# Patient Record
Sex: Female | Born: 1985 | Race: White | Hispanic: No | Smoking: Never smoker
Health system: Southern US, Community
[De-identification: ages and names within clinical notes are randomized; demographics above are authoritative.]

## PROBLEM LIST (undated history)

## (undated) ENCOUNTER — Inpatient Hospital Stay (HOSPITAL_COMMUNITY): Payer: Self-pay

## (undated) DIAGNOSIS — Z9889 Other specified postprocedural states: Secondary | ICD-10-CM

## (undated) DIAGNOSIS — F419 Anxiety disorder, unspecified: Secondary | ICD-10-CM

## (undated) DIAGNOSIS — Z9852 Vasectomy status: Secondary | ICD-10-CM

## (undated) DIAGNOSIS — F418 Other specified anxiety disorders: Secondary | ICD-10-CM

## (undated) DIAGNOSIS — G43909 Migraine, unspecified, not intractable, without status migrainosus: Secondary | ICD-10-CM

## (undated) DIAGNOSIS — R002 Palpitations: Secondary | ICD-10-CM

## (undated) DIAGNOSIS — F32A Depression, unspecified: Secondary | ICD-10-CM

## (undated) DIAGNOSIS — IMO0002 Reserved for concepts with insufficient information to code with codable children: Secondary | ICD-10-CM

## (undated) DIAGNOSIS — Z8742 Personal history of other diseases of the female genital tract: Secondary | ICD-10-CM

## (undated) DIAGNOSIS — R87619 Unspecified abnormal cytological findings in specimens from cervix uteri: Secondary | ICD-10-CM

## (undated) DIAGNOSIS — G43009 Migraine without aura, not intractable, without status migrainosus: Secondary | ICD-10-CM

## (undated) DIAGNOSIS — N871 Moderate cervical dysplasia: Secondary | ICD-10-CM

## (undated) HISTORY — DX: Reserved for concepts with insufficient information to code with codable children: IMO0002

## (undated) HISTORY — DX: Other specified postprocedural states: Z98.890

## (undated) HISTORY — DX: Unspecified abnormal cytological findings in specimens from cervix uteri: R87.619

## (undated) HISTORY — DX: Other specified anxiety disorders: F41.8

## (undated) HISTORY — PX: NO PAST SURGERIES: SHX2092

---
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
## 2007-01-30 ENCOUNTER — Ambulatory Visit: Payer: Self-pay | Admitting: Sports Medicine

## 2007-01-31 ENCOUNTER — Telehealth: Payer: Self-pay | Admitting: *Deleted

## 2007-03-21 ENCOUNTER — Telehealth: Payer: Self-pay | Admitting: *Deleted

## 2007-03-21 ENCOUNTER — Emergency Department (HOSPITAL_COMMUNITY): Admission: EM | Admit: 2007-03-21 | Discharge: 2007-03-21 | Payer: Self-pay | Admitting: Emergency Medicine

## 2007-04-25 ENCOUNTER — Telehealth: Payer: Self-pay | Admitting: *Deleted

## 2007-05-30 ENCOUNTER — Telehealth (INDEPENDENT_AMBULATORY_CARE_PROVIDER_SITE_OTHER): Payer: Self-pay | Admitting: *Deleted

## 2007-05-30 ENCOUNTER — Ambulatory Visit: Payer: Self-pay | Admitting: Family Medicine

## 2007-06-13 ENCOUNTER — Encounter (INDEPENDENT_AMBULATORY_CARE_PROVIDER_SITE_OTHER): Payer: Self-pay | Admitting: Family Medicine

## 2007-06-13 ENCOUNTER — Ambulatory Visit: Payer: Self-pay | Admitting: Family Medicine

## 2007-06-13 LAB — CONVERTED CEMR LAB
Beta hcg, urine, semiquantitative: NEGATIVE
Chlamydia, DNA Probe: NEGATIVE

## 2007-06-19 ENCOUNTER — Encounter (INDEPENDENT_AMBULATORY_CARE_PROVIDER_SITE_OTHER): Payer: Self-pay | Admitting: Family Medicine

## 2007-12-31 ENCOUNTER — Telehealth: Payer: Self-pay | Admitting: *Deleted

## 2008-01-01 ENCOUNTER — Ambulatory Visit: Payer: Self-pay | Admitting: Family Medicine

## 2008-01-01 ENCOUNTER — Ambulatory Visit (HOSPITAL_COMMUNITY): Admission: RE | Admit: 2008-01-01 | Discharge: 2008-01-01 | Payer: Self-pay | Admitting: Family Medicine

## 2008-01-01 ENCOUNTER — Encounter (INDEPENDENT_AMBULATORY_CARE_PROVIDER_SITE_OTHER): Payer: Self-pay | Admitting: Family Medicine

## 2008-01-01 DIAGNOSIS — R002 Palpitations: Secondary | ICD-10-CM

## 2008-01-01 LAB — CONVERTED CEMR LAB
BUN: 11 mg/dL (ref 6–23)
Calcium: 9.6 mg/dL (ref 8.4–10.5)
MCHC: 33.3 g/dL (ref 30.0–36.0)
Potassium: 4.5 meq/L (ref 3.5–5.3)
RDW: 13 % (ref 11.5–15.5)
Sodium: 141 meq/L (ref 135–145)
TSH: 1.291 microintl units/mL (ref 0.350–5.50)

## 2008-01-02 ENCOUNTER — Telehealth: Payer: Self-pay | Admitting: *Deleted

## 2008-01-03 ENCOUNTER — Encounter (INDEPENDENT_AMBULATORY_CARE_PROVIDER_SITE_OTHER): Payer: Self-pay | Admitting: Family Medicine

## 2008-01-03 ENCOUNTER — Encounter: Payer: Self-pay | Admitting: *Deleted

## 2008-01-06 ENCOUNTER — Encounter (INDEPENDENT_AMBULATORY_CARE_PROVIDER_SITE_OTHER): Payer: Self-pay | Admitting: Family Medicine

## 2008-01-06 ENCOUNTER — Ambulatory Visit: Payer: Self-pay | Admitting: Family Medicine

## 2008-01-07 ENCOUNTER — Ambulatory Visit: Payer: Self-pay | Admitting: Family Medicine

## 2008-02-11 ENCOUNTER — Telehealth (INDEPENDENT_AMBULATORY_CARE_PROVIDER_SITE_OTHER): Payer: Self-pay | Admitting: Family Medicine

## 2008-03-09 ENCOUNTER — Emergency Department (HOSPITAL_COMMUNITY): Admission: EM | Admit: 2008-03-09 | Discharge: 2008-03-09 | Payer: Self-pay | Admitting: Family Medicine

## 2008-03-21 IMAGING — CR DG MANDIBLE 4+V
4 series · 4 of 4 positions shown · non-contrast
Comparison: none

CLINICAL DATA: Left jaw trauma and pain.  
 MANDIBLE ? 4 VIEW:

[view not recorded (1 of 4)]
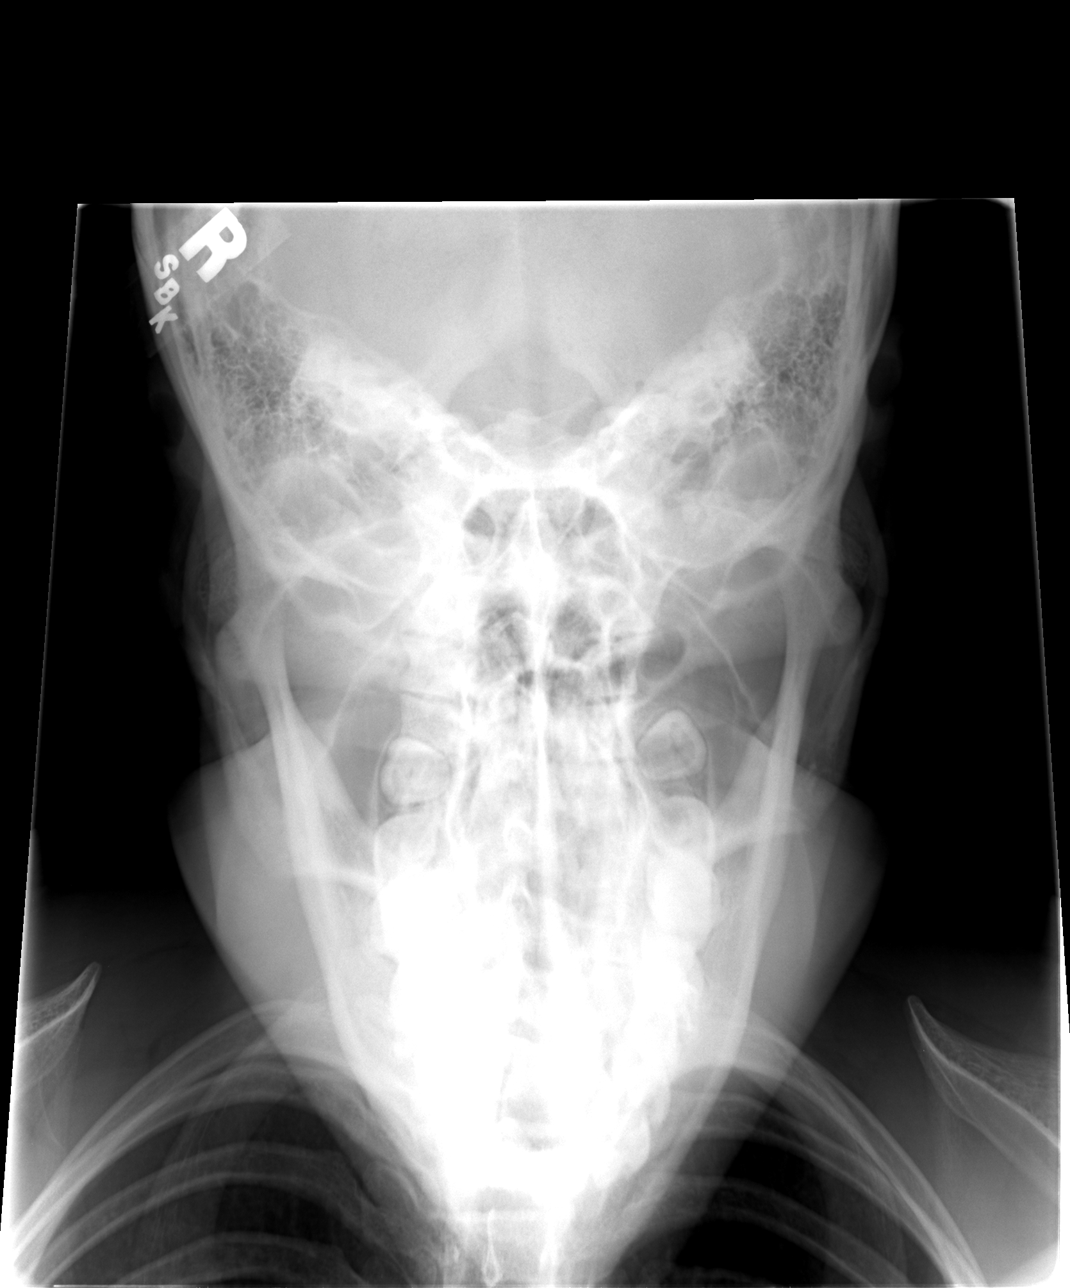

[view not recorded (2 of 4)]
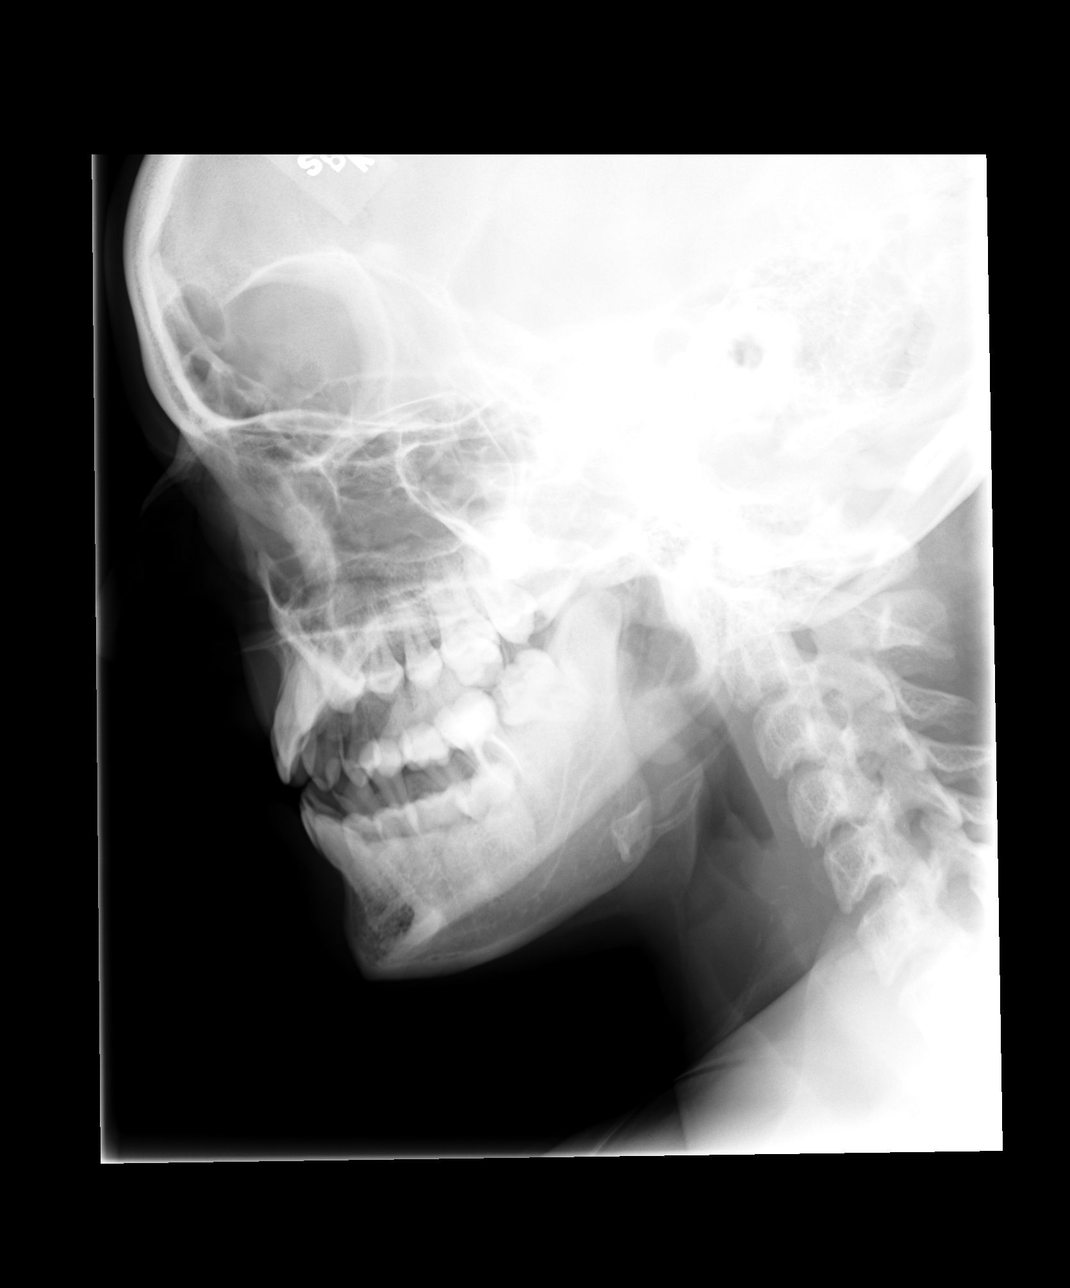

[view not recorded (3 of 4)]
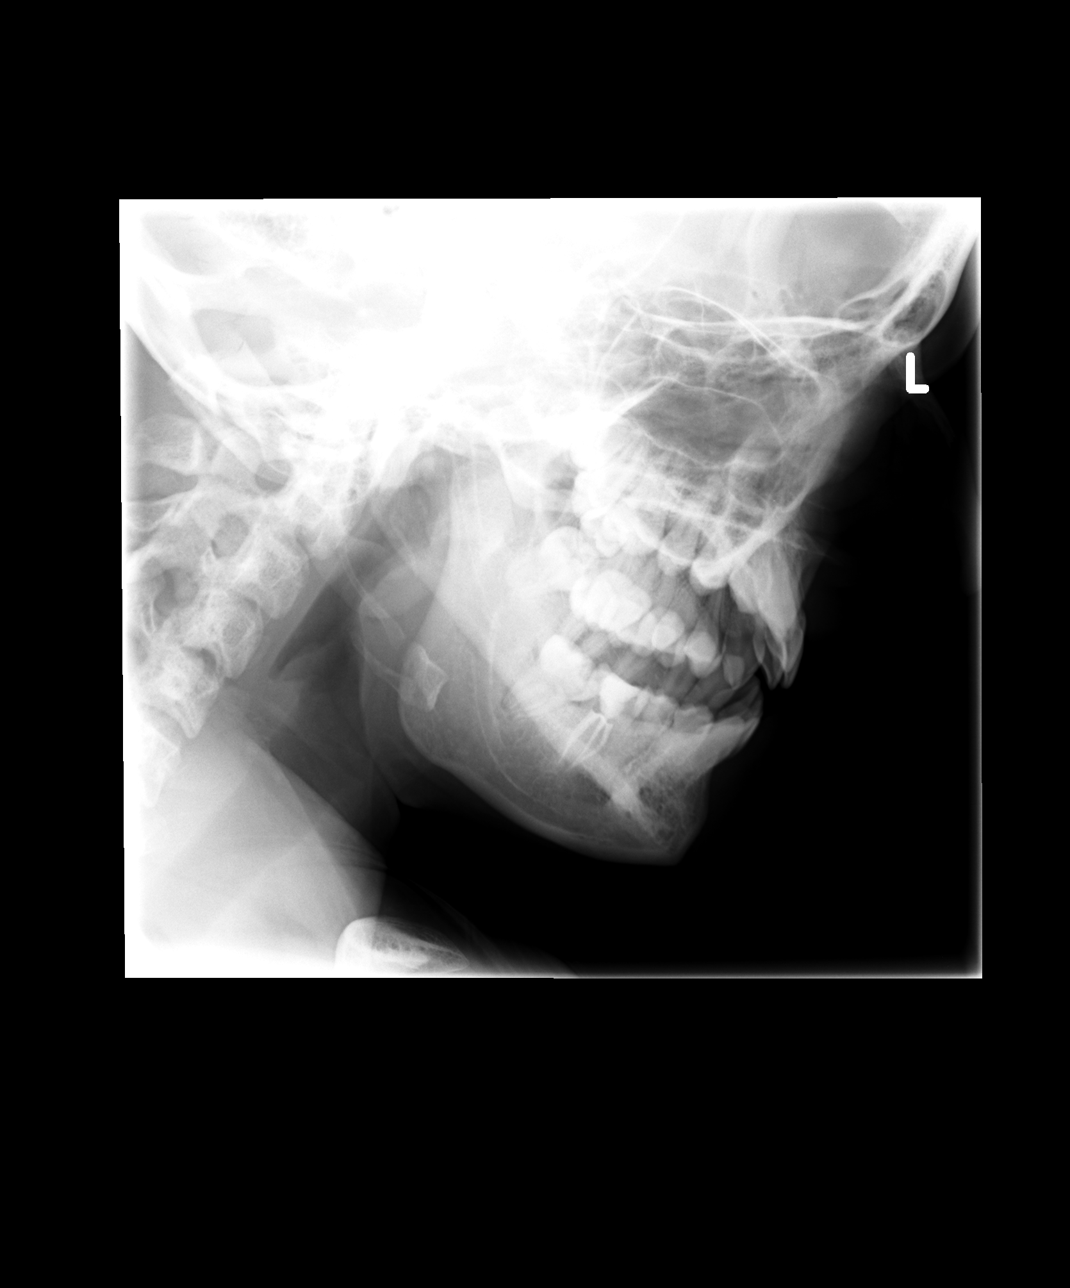

[view not recorded (4 of 4)]
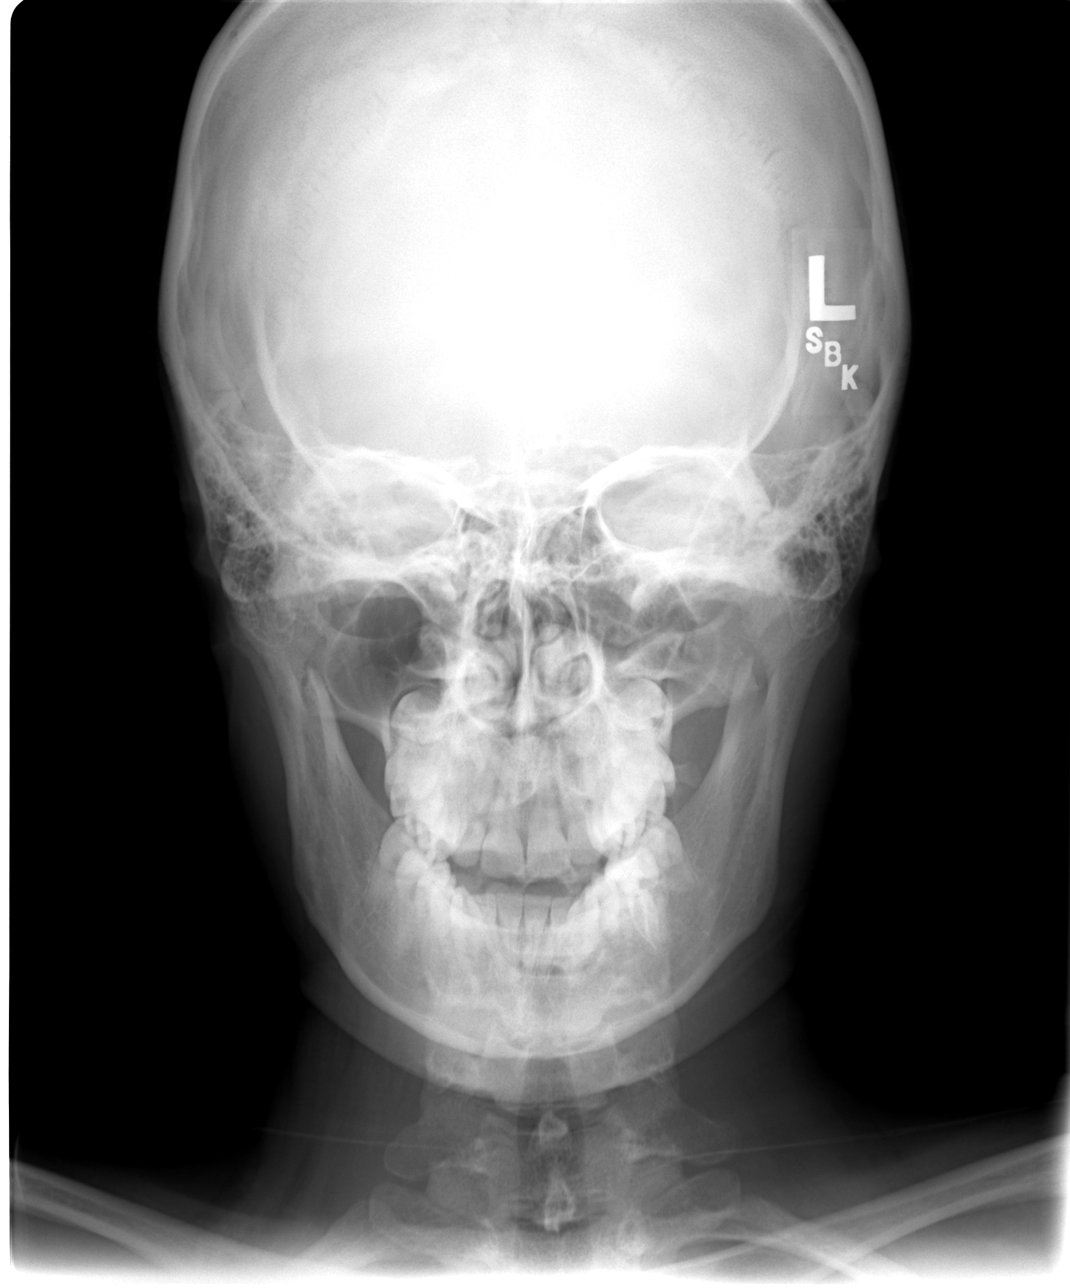

[4 of 4 positions shown; findings below may reference images not displayed]

FINDINGS: There is no evidence of fracture or dislocation.  No other bone lesions are seen involving the mandible.
IMPRESSION: Negative.

## 2008-05-22 ENCOUNTER — Encounter (INDEPENDENT_AMBULATORY_CARE_PROVIDER_SITE_OTHER): Payer: Self-pay | Admitting: Family Medicine

## 2008-05-22 ENCOUNTER — Ambulatory Visit: Payer: Self-pay | Admitting: Family Medicine

## 2008-05-22 LAB — CONVERTED CEMR LAB
Beta hcg, urine, semiquantitative: NEGATIVE
GC Probe Amp, Genital: NEGATIVE

## 2008-05-26 ENCOUNTER — Encounter (INDEPENDENT_AMBULATORY_CARE_PROVIDER_SITE_OTHER): Payer: Self-pay | Admitting: Family Medicine

## 2008-11-24 ENCOUNTER — Ambulatory Visit: Payer: Self-pay | Admitting: Family Medicine

## 2008-11-25 ENCOUNTER — Encounter (INDEPENDENT_AMBULATORY_CARE_PROVIDER_SITE_OTHER): Payer: Self-pay | Admitting: Family Medicine

## 2008-11-25 ENCOUNTER — Ambulatory Visit (HOSPITAL_COMMUNITY): Admission: AD | Admit: 2008-11-25 | Discharge: 2008-11-25 | Payer: Self-pay | Admitting: Family Medicine

## 2008-11-26 ENCOUNTER — Encounter: Payer: Self-pay | Admitting: *Deleted

## 2008-11-27 ENCOUNTER — Encounter (INDEPENDENT_AMBULATORY_CARE_PROVIDER_SITE_OTHER): Payer: Self-pay | Admitting: Family Medicine

## 2009-02-03 ENCOUNTER — Ambulatory Visit: Payer: Self-pay | Admitting: Family Medicine

## 2009-02-03 LAB — CONVERTED CEMR LAB
Bilirubin Urine: NEGATIVE
Protein, U semiquant: >=300
Urobilinogen, UA: 0.2

## 2009-02-04 ENCOUNTER — Encounter (INDEPENDENT_AMBULATORY_CARE_PROVIDER_SITE_OTHER): Payer: Self-pay | Admitting: Family Medicine

## 2009-05-20 ENCOUNTER — Telehealth: Payer: Self-pay | Admitting: Family Medicine

## 2009-05-20 ENCOUNTER — Ambulatory Visit: Payer: Self-pay | Admitting: Family Medicine

## 2009-09-12 ENCOUNTER — Emergency Department (HOSPITAL_COMMUNITY): Admission: EM | Admit: 2009-09-12 | Discharge: 2009-09-12 | Payer: Self-pay | Admitting: Family Medicine

## 2009-10-28 ENCOUNTER — Ambulatory Visit: Payer: Self-pay | Admitting: Family Medicine

## 2009-10-28 ENCOUNTER — Encounter: Payer: Self-pay | Admitting: Family Medicine

## 2009-10-28 DIAGNOSIS — A630 Anogenital (venereal) warts: Secondary | ICD-10-CM

## 2009-10-28 LAB — CONVERTED CEMR LAB

## 2009-10-29 ENCOUNTER — Encounter: Payer: Self-pay | Admitting: Family Medicine

## 2009-11-09 ENCOUNTER — Encounter: Payer: Self-pay | Admitting: Family Medicine

## 2009-11-25 ENCOUNTER — Encounter: Payer: Self-pay | Admitting: Family Medicine

## 2009-11-25 ENCOUNTER — Ambulatory Visit: Payer: Self-pay | Admitting: Family Medicine

## 2009-11-25 DIAGNOSIS — R8789 Other abnormal findings in specimens from female genital organs: Secondary | ICD-10-CM | POA: Insufficient documentation

## 2010-01-26 ENCOUNTER — Ambulatory Visit: Payer: Self-pay | Admitting: Family Medicine

## 2010-01-26 ENCOUNTER — Encounter: Payer: Self-pay | Admitting: Family Medicine

## 2010-01-26 LAB — CONVERTED CEMR LAB: Beta hcg, urine, semiquantitative: NEGATIVE

## 2010-02-02 ENCOUNTER — Ambulatory Visit: Payer: Self-pay | Admitting: Family Medicine

## 2010-02-02 LAB — CONVERTED CEMR LAB: Beta hcg, urine, semiquantitative: NEGATIVE

## 2010-04-19 ENCOUNTER — Emergency Department (HOSPITAL_COMMUNITY): Admission: EM | Admit: 2010-04-19 | Discharge: 2010-04-19 | Payer: Self-pay | Admitting: Emergency Medicine

## 2010-06-21 ENCOUNTER — Encounter: Payer: Self-pay | Admitting: *Deleted

## 2010-06-21 ENCOUNTER — Ambulatory Visit: Payer: Self-pay | Admitting: Family Medicine

## 2010-06-21 DIAGNOSIS — H547 Unspecified visual loss: Secondary | ICD-10-CM | POA: Insufficient documentation

## 2010-06-21 LAB — CONVERTED CEMR LAB: Beta hcg, urine, semiquantitative: POSITIVE

## 2010-06-28 ENCOUNTER — Ambulatory Visit (HOSPITAL_COMMUNITY): Admission: RE | Admit: 2010-06-28 | Discharge: 2010-06-28 | Payer: Self-pay | Admitting: Family Medicine

## 2010-06-28 ENCOUNTER — Telehealth: Payer: Self-pay | Admitting: Family Medicine

## 2010-06-28 ENCOUNTER — Encounter: Payer: Self-pay | Admitting: Family Medicine

## 2010-06-29 ENCOUNTER — Encounter: Payer: Self-pay | Admitting: Family Medicine

## 2010-07-05 ENCOUNTER — Encounter: Payer: Self-pay | Admitting: Family Medicine

## 2010-07-05 ENCOUNTER — Ambulatory Visit: Payer: Self-pay | Admitting: Family Medicine

## 2010-07-05 LAB — CONVERTED CEMR LAB
HIV: NONREACTIVE
Rh Type: POSITIVE

## 2010-07-07 ENCOUNTER — Encounter: Payer: Self-pay | Admitting: Family Medicine

## 2010-07-10 LAB — CONVERTED CEMR LAB
Basophils Absolute: 0 10*3/uL (ref 0.0–0.1)
Basophils Relative: 0 % (ref 0–1)
Eosinophils Absolute: 0.1 10*3/uL (ref 0.0–0.7)
Eosinophils Relative: 3 % (ref 0–5)
HCT: 43.5 % (ref 36.0–46.0)
Hemoglobin: 12.9 g/dL (ref 12.0–15.0)
Hepatitis B Surface Ag: NEGATIVE
MCHC: 29.7 g/dL — ABNORMAL LOW (ref 30.0–36.0)
MCV: 99.5 fL (ref 78.0–100.0)
Monocytes Absolute: 0.4 10*3/uL (ref 0.1–1.0)
Platelets: 212 10*3/uL (ref 150–400)
RDW: 14.2 % (ref 11.5–15.5)

## 2010-07-21 ENCOUNTER — Ambulatory Visit: Payer: Self-pay | Admitting: Family Medicine

## 2010-07-21 LAB — CONVERTED CEMR LAB
Chlamydia, DNA Probe: NEGATIVE
GC Probe Amp, Genital: NEGATIVE

## 2010-08-08 ENCOUNTER — Encounter: Payer: Self-pay | Admitting: Family Medicine

## 2010-08-08 ENCOUNTER — Ambulatory Visit (HOSPITAL_COMMUNITY)
Admission: RE | Admit: 2010-08-08 | Discharge: 2010-08-08 | Payer: Self-pay | Source: Home / Self Care | Admitting: Family Medicine

## 2010-08-17 ENCOUNTER — Ambulatory Visit: Payer: Self-pay | Admitting: Family Medicine

## 2010-09-07 ENCOUNTER — Ambulatory Visit (HOSPITAL_COMMUNITY)
Admission: RE | Admit: 2010-09-07 | Discharge: 2010-09-07 | Payer: Self-pay | Source: Home / Self Care | Attending: Family Medicine | Admitting: Family Medicine

## 2010-09-15 ENCOUNTER — Ambulatory Visit: Admission: RE | Admit: 2010-09-15 | Discharge: 2010-09-15 | Payer: Self-pay | Source: Home / Self Care

## 2010-09-21 ENCOUNTER — Ambulatory Visit (HOSPITAL_COMMUNITY)
Admission: RE | Admit: 2010-09-21 | Discharge: 2010-09-21 | Payer: Self-pay | Source: Home / Self Care | Attending: Family Medicine | Admitting: Family Medicine

## 2010-09-21 ENCOUNTER — Encounter: Payer: Self-pay | Admitting: Family Medicine

## 2010-10-01 ENCOUNTER — Encounter: Payer: Self-pay | Admitting: Family Medicine

## 2010-10-13 NOTE — Assessment & Plan Note (Signed)
Summary: ob ,df   Vital Signs:  Patient profile:   25 year old female Weight:      118.5 pounds Temp:     97.8 degrees F oral Pulse rate:   79 / minute Pulse rhythm:   regular BP sitting:   104 / 62 Cuff size:   regular  Vitals Entered By: Loralee Pacas CMA (September 15, 2010 3:47 PM) CC: ob Is Patient Diabetic? No   Primary Provider:  Delbert Harness MD  CC:  ob.  History of Present Illness: Correction from previous documentation:  This patient is Caucasian.  25 yo white F G1P0 returns for an OB follow-up.  She is 17w 4d today.  At her last visit, she c/o chronic daily headaches.  Was advised to take Tylenol as needed, but to use sparingly.  Today she says that her HAs have resolved.    She c/o abnormal vaginal discharge that occurred last week, but has resolved now.  She also experienced some itching in her genital area.  Her boyfriend also experienced genital itching, went to his MD, and is being treated for a fungal infection.  His MD said that he may have extracted the fungal infection from my patient.  Patient is asymptomatic today, but concerned she may pass it on to her boyfriend via intercourse.  Habits & Providers  Alcohol-Tobacco-Diet     Tobacco Status: never     Cigarette Packs/Day: n/a  Exercise-Depression-Behavior     Have you felt down or hopeless? no     Have you felt little pleasure in things? no     Depression Counseling: not indicated; screening negative for depression     Seat Belt Use: always  Current Medications (verified): 1)  Prenatal Vitamins 0.8 Mg Tabs (Prenatal Multivit-Min-Fe-Fa) .... Take One Tablet Daily 2)  Nystatin 100000 Unit/gm Crea (Nystatin) .... Apply To Affected Area Twice A Day As Needed If Symptomatic.  Allergies (verified): 1)  Sudafed (Pseudoephedrine Hcl)  Past History:  Past Medical History: Last updated: 10/28/2009 Microscopic hematuria with UTI on 7/07 Normal UA 8, Pyelonephritis 7/07  04/06/08 - Holter monitor test  ([facility])- rare PACs, occ PVC, basic rhythm sinus rates 51-160, daily entries did not correlate with any significant arrhythmias  G1P0010- TAB 11/2008  Family History: Last updated: 07/21/2010 Sister with scoliosis Grandfather - unknown kind of cancer mom with arrythmia  uncle with mi at age 40 asthma - history  No female cancers  No DM in 1st degree relatives.  Social History: Last updated: 06/21/2010 Did graduate from highschool.  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  Denies smoking, and ilicit drugs use. Occasional ETOH.   Physical Exam  General:  alert, healthy-appearing, and cooperative to examination.     Impression & Recommendations:  Problem # 1:  PREGNANCY, NORMAL (ICD-V22.2) Pt doing well today.  Since pt is asymptomatic today, will not give Diflucan.  Will treat with Nystatin cream as needed if patient becomes symptomatic.  Discussed with couple that yeast infections are not typically sexually transmitted.  It seems like her infection has resolved on its own.  Told patient if she experiences any dysuria, hematuria, increased itchiness to call MD for an re-evaluation.  Waiting for results of integrated screening test completed 12/28.  Will schedule an anatomy scan for pt.  Will call her with date/time.  Pt to f/u in 4 weeks for next OB visit.   Orders: Medicaid OB visit -  [facility] [phone])  Patient Instructions: 1)  It was great to see you today. 2)  Your pregnancy seems to be progressing as it should. 3)  You can pick up your new medication and take as directed. 4)  If your symptoms become worse or you experience pain when you urinate, bloody urine, or itchiness, please call MD. 5)  We will set up your anatomy scan at [facility] and call you with the time/date. 6)  Please schedule a follow-up appointment in 4 weeks. 7)  Thanks. Prescriptions: NYSTATIN 100000 UNIT/GM CREA (NYSTATIN) Apply to  affected area twice a day as needed if symptomatic.  #1 x 0   Entered and Authorized by:   Vamsi Apfel de Lawson Radar  MD   Signed by:   Barnabas Lister  MD on 09/15/2010   Method used:   Electronically to        [facility] [phone]* (retail)       [address]       [place], [place]  [address]       Ph: [phone] or [phone]       Fax: [phone]   RxID:   [id number]    Orders Added: 1)  Medicaid OB visit - [facility] [[id number]]      Flowsheet View for Follow-up Visit    Estimated weeks of       gestation:     17 4/7    Weight:     118.5    Blood pressure:   104 / 62    Headache:     few    Nausea/vomiting:   No    Edema:     0    Vaginal bleeding:   no    Vaginal discharge:   d/c    Fundal height:      19    FHR:       148    Fetal activity:     no    Labor symptoms:   no    Smoking:     n/a    Flowsheet View for Follow-up Visit    Estimated weeks of       gestation:     17 4/7    Weight:     118.5    Blood pressure:   104 / 62    Hx headache?     few    Nausea/vomiting?   No    Edema?     0    Bleeding?     no    Leakage/discharge?   d/c    Fetal activity:       no    Labor symptoms?   no    Fundal height:      19    FHR:       148    Smoking PPD:   n/a   Appended Document: ob ,df  order for antomy [place].Loralee Pacas CMA  September 15, 2010 5:12 PM    Clinical Lists Changes  Orders: Added new Test order of Ultrasound (Ultrasound) - Signed

## 2010-10-13 NOTE — Assessment & Plan Note (Signed)
Summary: genital warts   Vital Signs:  Patient profile:   25 year old female Height:      61.5 inches Weight:      113 pounds BMI:     21.08 BSA:     1.49 Temp:     98.7 degrees F Pulse rate:   97 / minute BP sitting:   135 / 79  Vitals Entered By: Jone Baseman CMA (Jan 26, 2010 2:04 PM) CC: genital warts Is Patient Diabetic? No Pain Assessment Patient in pain? no        Primary Care Provider:  Delbert Harness MD  CC:  genital warts.  History of Present Illness: 25 yo female here for podophyllin treatment.  Missed last treatment.  Treated once in March.  Warts are spreading.  PODOPHYLLIN TREATMENT.  Negative pregnancy test confirmed.  Verbal consent obtained.  Petrolatum jelly applied around genital warts.  Scant podophyllyn applied with swab to warts.  Petrolatum jelly and gauze applied.  Patient tolerated well.  Told to remove dressing in 2 hours. wash area with soap and water. RTC in 1 week for repeat treatment.  Patient tolerated procedure with no problems. <0.20 cc podophyllin used.  Habits & Providers  Alcohol-Tobacco-Diet     Tobacco Status: never  Allergies: 1)  Sudafed (Pseudoephedrine Hcl)  Physical Exam  General:  Well-developed,well-nourished,in no acute distress; alert,appropriate and cooperative throughout examination Genitalia:  Normal female genitalia 2 lesions c/w genital warts between vagina and anus.  Approx 6-8 mm each.  Now with several smaller warts spreading anterior to vagina.    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 Orders: U [facility] [id number]) Provider Misc Charge- [facility] (Misc)  Complete Medication List: 1)  Yaz 3-0.02 Mg Tabs (Drospirenone-ethinyl estradiol) .... Take one tablet at the same time daily  Patient Instructions: 1)  Please see Rudell Cobb to qualify for reduced or free medical services within the [facility].  Call her at [phone]  today. 2)  Please schedule a follow-up appointment in 1 week for repeat treatment.  Laboratory Results   Urine Tests  Date/Time Received: Jan 26, 2010 2:35 PM  Date/Time Reported: Jan 26, 2010 2:49 PM     Urine HCG: negative Comments: ...............test performed by......Marland KitchenBonnie A. [place], MLS (ASCP)cm

## 2010-10-13 NOTE — Letter (Signed)
Summary: Results Follow-up Letter  [facility]  [address]   [place], [place] [address]   Phone: [phone]  Fax: [phone]    11/09/2009  [address] [place], [place]  [address]  Dear Ms. MOODY,   The following are the results of your recent test(s):  Test     Result     Pap Smear    Normal_______  Not Normal__X___       Comments: results: LSIL  Your pap smear is abnormal and I would like you to schedule a colposcopy so we can further evaluate this.  We can do this at our office, just call for an appt.  If you have further questions, please give [place] a call or we can discuss when you come to the office.  Sincerely,  Delbert Harness MD [facility]           Appended Document: Results Follow-up Letter mailed.

## 2010-10-13 NOTE — Miscellaneous (Signed)
Summary: Procedure Consent  Procedure Consent   Imported By: Bradly Bienenstock 11/30/2009 15:48:04  _____________________________________________________________________  External Attachment:    Type:   Image     Comment:   External Document

## 2010-10-13 NOTE — Assessment & Plan Note (Signed)
Summary: colpo,df   Vital Signs:  Patient profile:   25 year old female Height:      61.5 inches Weight:      115.7 pounds BMI:     21.59 Temp:     97.9 degrees F oral Pulse rate:   79 / minute BP sitting:   111 / 73  (left arm) Cuff size:   regular  Vitals Entered By: Gladstone Pih (November 25, 2009 10:45 AM) CC: Colpo Is Patient Diabetic? No Pain Assessment Patient in pain? no        Primary Care Provider:  Delbert Harness MD  CC:  Colpo.  History of Present Illness: 25 yo female presenting for:  COLPOSCOPY.  LGSIL on pap  GENITAL WARTS.  Could not afford Aldara cream.  Discussed option of Podophyllin.  She would like this done.  Discussed that cannot be done if pregnant.  She is sexually active (LMP 10/31/2009) and is not using contraception.  Habits & Providers  Alcohol-Tobacco-Diet     Tobacco Status: never  Allergies: 1)  Sudafed (Pseudoephedrine Hcl)  Physical Exam  Genitalia:  Pelvic Exam:        External: normal female genitalia 2 lesions c/w genital warts between vagina and anus.  Approx 6-8 mm each        Vagina: normal without lesions or masses        Cervix: normal without lesions or masses        Adnexa: normal bimanual exam without masses or fullness        Uterus: normal by palpation        Pap smear: not performed Additional Exam:  PODOPHYLLIN TREATMENT.  Negative pregnancy test confirmed.  Verbal consent obtained.  Petrolatum jelly applied around genital warts.  Scant podophyllyn applied with swab to warts.  Petrolatum jelly and gauze applied.  Patient tolerated well.  Told to remove dressing in 2 hours. wash area with soap and water. RTC in 1 week for repeat treatment. Patient tolerated procedure with no problems. <0.20 cc podophyllin used.  Patient given informed consent, signed copy in the chart. Placed in lithotomy position. Cervix viewed with speculum and colposcope. Was the entire squamocolumnar junction senoen?yes ectropion Any acetowhite  lesions noted?no Any abnormalities seen with green filter?no Any abnormalities seen with application of Lugol's solution?no Was the endocervical canal sampled?no Were any cervical biopsies taken?no Were there any complications?no COMMENTS: Patient was given post procedure instructions. .    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 Take one tablet at the same time daily  Other Orders: U [facility] ([id number])  Patient Instructions: 1)  Pleasure to meet you today. 2)  Your colposcopy was normal. 3)  You need to have a repeat Pap in 1 year. 4)  Please schedule a follow-up appointment in 1 week with the [facility] for repeat wart treatment.    Laboratory Results   Urine Tests  Date/Time Received: November 25, 2009 11:50 AM  Date/Time Reported: November 25, 2009 12:06 PM     Urine HCG: negative Comments: ...........test performed by...........Marland KitchenTerese Door, CMA

## 2010-10-13 NOTE — Progress Notes (Signed)
Summary: phn msg   Phone Note Call from Patient Call back at Home Phone [phone]   Caller: Patient Summary of Call: Pt says she is returning Dr. Leonie Green call. Initial call taken by: Clydell Hakim,  June 28, 2010 4:13 PM  Follow-up for Phone Call        discussed results of ultrasound with patient.  Report noted subchorionic hemorrhage.  likely incidnetnal finding, small risk of miscarraige.  No change in management now.  advised to go to [facility] hospital if vaginal bleeding.    Patient had question about genital warts.  advised if has bothersome lesions, make appointment for treatment (TCA or cryo), otherwise genital warts does not affect pregnancy.  Discussed may rarely be an issue in delivery if obstructs canal.   Follow-up by: Delbert Harness MD,  June 29, 2010 11:37 AM

## 2010-10-13 NOTE — Assessment & Plan Note (Signed)
Summary: cpe/pap,tcb   Vital Signs:  Patient profile:   25 year old female Height:      61.5 inches Weight:      113.4 pounds BMI:     21.16 Temp:     97.9 degrees F oral Pulse rate:   72 / minute BP sitting:   108 / 71  (left arm) Cuff size:   regular  Vitals Entered By: Gladstone Pih (October 28, 2009 3:44 PM) CC: CPE, PAP Is Patient Diabetic? No Pain Assessment Patient in pain? no        Primary Care Provider:  Alanda Amass MD  CC:  CPE and PAP.  History of Present Illness: 25 yo G1P0010  Therapeutic Abortion last march at 6-7 weeks. LMP:  Jan 20th, regular, no dysmenorrhea. Contraception: None Regular Menses: yes  Hx of Anemia:yes FHx of Breast, Uterine, Cervical or Ovarian Cancer:  No Last Pap:  2008 or 2009 Hx of Abnormal Pap:  No Desires STD testing: Yes Last Mammogram: No Abnormalities on Self-exam:  No Hx of Abnormal Mammogram:  No  Other issues / complaints:  "Bumps" in gential area.  First noticed 2 months ago a single bump.  Now more.  Flesh colored.  Normal vaginal discharge. None on partner.  No burning or itching.   Habits & Providers  Alcohol-Tobacco-Diet     Tobacco Status: never  Current Medications (verified): 1)  Yaz 3-0.02 Mg Tabs (Drospirenone-Ethinyl Estradiol) .... Take One Tablet At The Same Time Daily 2)  Aldara 5 % Crea (Imiquimod) .... Apply To Affected Areas 3 Times A Week Until Cleared or For 16 Weeks.  Allergies: 1)  Sudafed (Pseudoephedrine Hcl)  Past History:  Past Medical History: Microscopic hematuria with UTI on 7/07 Normal UA 8, Pyelonephritis 7/07  04/06/08 - Holter monitor test ([facility])- rare PACs, occ PVC, basic rhythm sinus rates 51-160, daily entries did not correlate with any significant arrhythmias  G1P0010- TAB 11/2008  Social History: Did graduate from [employer].  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 Denies smoking, and ilicit  drugs use. Occasional ETOH.   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  Lives with mom.  One sexual partner for the past 6 months.  Review of Systems      See HPI General:  Denies fever and weight loss. CV:  Denies chest pain or discomfort, palpitations, and shortness of breath with exertion. Resp:  Denies cough and shortness of breath. GI:  Denies abdominal pain and change in bowel habits. GU:  Complains of genital sores; denies abnormal vaginal bleeding, discharge, and dysuria.  Physical Exam  General:  VS reviewed.  Otherwise NAD. Breasts:  No mass, nodules, thickening, tenderness, bulging, retraction, inflamation, nipple discharge or skin changes noted.   Lungs:  Normal respiratory effort, chest expands symmetrically. Lungs are clear to auscultation, no crackles or wheezes. Heart:  Normal rate and regular rhythm. S1 and S2 normal without gallop, murmur, click, rub or other extra sounds. Abdomen:  Bowel sounds positive,abdomen soft and non-tender without masses, organomegaly or hernias noted. Genitalia:  Two small clusters of genital warts located at 5 and 7 oclock on perineum at opening of vagina.  Pelvic Exam:        External: normal female genitalia without lesions or masses        Vagina: normal without lesions or masses        Cervix: normal without lesions or masses  Adnexa: normal bimanual exam without masses or fullness        Uterus: normal by palpation        Pap smear: performed   Impression & Recommendations:  Problem # 1:  Gynecological examination-routine (ICD-V72.31) Discussed contraception options- patient would like to stay with Yaz even thought it is very expensive for her.  Offered her other options and to call back if she changes her mind.  Advised to take daily multivitamin.  Follow-up in 1 year or sooner if needed.  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  Advised if does not improve, may consider TCA teratment at  health department as we only offer cryotherapy.  Advised usuing protection at all times and of risk of trasmission to others.  Given handout.  patient without further questions.  Complete Medication List: 1)  Yaz 3-0.02 Mg Tabs (Drospirenone-ethinyl estradiol) .... Take one tablet at the same time daily 2)  Aldara 5 % Crea (Imiquimod) .... Apply to affected areas 3 times a week until cleared or for 16 weeks.  Other Orders: GC/[facility]lamydia[facility] ([id number]) [phone][facility] Pap Smear-[facility] ([id number]) HIV-[facility] ([id number]) RPR[facility] [phone])  Patient Instructions: 1)  Take a daily multivitamin 2)  If you find Yaz too expensive, call and I will prescribe you a generic birth control.  Use backup birth co ntrol for the first month. 3)  Aldara: Apply a thin layer 3 times/week on alternative days prior to bedtime and leave on skin for 6-10 hours. Remove by washing with mild soap and water. Continue imiquimod treatment until there is total clearance of the genital/perianal warts or 16 weeks.  Cannot be used in pregnancy. 4)  If not improvement in 6 weeks, I would go to Health Department for TCA treatment. 5)  make follow-up appt to talk about your asthma and any other concerns Prescriptions: ALDARA 5 % CREA (IMIQUIMOD) apply to affected areas 3 times a week until cleared or for 16 weeks.  #1 x 0   Entered and Authorized by:   Delbert Harness MD   Signed by:   Delbert Harness MD on 10/28/2009   Method used:   Print then Give to Patient   RxID:   [id number] YAZ 3-0.02 MG TABS (DROSPIRENONE-ETHINYL ESTRADIOL) take one tablet at the same time daily  #1 x 11   Entered and Authorized by:   Delbert Harness MD   Signed by:   Delbert Harness MD on 10/28/2009   Method used:   Print then Give to Patient   RxID:   [id number]

## 2010-10-13 NOTE — Letter (Signed)
Summary: Results Follow-up Letter  [facility]  [address]   [place], [place] [address]   Phone: [phone]  Fax: [phone]    10/29/2009  [address] [place], [place]  [address]  Dear Ms. MOODY,   The following are the results of your recent test(s):  Your tests for gonorrhea, chlamydia, HIV, Syphillis are all negative.  PLease feel free to give the office a call if tehre is anything else I can do for you.   Sincerely,  Delbert Harness MD [facility]           Appended Document: Results Follow-up Letter mailed.

## 2010-10-13 NOTE — Assessment & Plan Note (Signed)
Summary: ob,df    Vital Signs:  Patient profile:   25 year old female Height:      61.5 inches Weight:      115.4 pounds Temp:     98.2 degrees F oral Pulse rate:   68 / minute BP sitting:   104 / 70  (right arm)  Primary Alejandra Conway:  Delbert Harness MD  CC:  follow up OB.  History of Present Illness: This is a 25 year old G1P0 AAF who comes to clinic for her 4 week OB follow-up.  Today she is 13 weeks and 3 days per LMP.  Patient says she is doing well.  She c/o headaches that occur daily and fatigue.  Headaches occur everyday.  She takes Tylenol ES once or twice/week for severe cases.  No associated blurry vision, watery eyes, rhinorrhea, neck pain/muscle spasms.  Located in frontal area, non-radiating.  Pt used to get HA prior to pregnancy, but they have been occurring more frequently since getting pregnant.    ROS: Headache, fatigue.  Denies CP, SOB, N/V, abdominal pain.  Preventive Screening-Counseling & Management  Alcohol-Tobacco     Packs/Day: n/a  Current Medications (verified): 1)  Prenatal Vitamins 0.8 Mg Tabs (Prenatal Multivit-Min-Fe-Fa) .... Take One Tablet Daily  Allergies (verified): 1)  Sudafed (Pseudoephedrine Hcl)  Social History: Packs/Day:  n/a  Review of Systems       per HPI  Physical Exam  General:  Well-developed,well-nourished,in no acute distress; alert,appropriate and cooperative throughout examination   Impression & Recommendations:  Problem # 1:  PREGNANCY, MULTIGRAVIDA (ICD-V22.1) Patient doing well at 13/[redacted] weeks pregnant.  For headache complaint, discussed with patient that Tylenol is the safest medication in pregnancy.  If Tylenol does not provide relief, patient can try ice packs or heating pads in addition to Tylenol.  When patient's Medicaid is approved, she may consider going to the headache clinic for biofeedback.  She can also try neck massages if this is a tension headache.  Discussed with patient the importance of not taking Tylenol  daily.  This can cause dependency and rebound headaches which may be more painful.  Patient agreed and understood with plan.  She is to follow up with me in 4 weeks for another OB follow-up appt.  At that time, we will discuss an anatomical [place].  Orders: Medicaid OB visit - [facility] ([id number])  Patient Instructions: 1)  It was great to meet you today. 2)  Please follow up with MD in 4 weeks for OB visit. 3)  Please take Tylenol as needed and as infrequently as possible. 4)  You may try heating pads or ice packs to help relieve your pain.  5)  If you experience any abnormal heavy bleeding, please call MD or go to [facility] ED. 6)  Please continue to take your multivitamins daily. 7)  Thank you!   Orders Added: 1)  Medicaid OB visit - [facility] [[id number]]     Flowsheet View for Follow-up Visit    Estimated weeks of       gestation:     13 3/7    Weight:     115.4    Blood pressure:   104 / 70    Hx headache?     daily    Nausea/vomiting?   No    Edema?     0    Bleeding?     no    Leakage/discharge?   no    Fetal activity:  no    Labor symptoms?   no    FHR:       157    Taking Vitamins?   Y    Smoking PPD:   n/a

## 2010-10-13 NOTE — Assessment & Plan Note (Signed)
Summary: NEW OB/PER KATHY F/eo   Vital Signs:  Patient profile:   25 year old female LMP:     05/09/2010 Weight:      115 pounds Temp:     98.4 degrees F oral Pulse rate:   98 / minute Pulse rhythm:   regular BP sitting:   145 / 80  (left arm) Cuff size:   regular  Vitals Entered By: Loralee Pacas CMA (July 21, 2010 4:01 PM)  Serial Vital Signs/Assessments:  Time      Position  BP       Pulse  Resp  Temp     By                     56/21                          Sarah Swaziland MD  CC: new ob Is Patient Diabetic? No LMP (date): 05/09/2010 EDC by LMP==> 02/13/2011 EDC 02/19/2011 LMP - Character: light LMP - Reliable? Yes     Enter LMP: 05/09/2010 Last PAP Result LOW GRADE SQUAMOUS INTRAEPITHELIAL LESION: CIN-1/ VAIN-1/ HPV. (LSIL)   CC:  new ob.  Habits & Providers  Alcohol-Tobacco-Diet     Tobacco Status: never  Exercise-Depression-Behavior     Have you felt down or hopeless? no     Have you felt little pleasure in things? no     Depression Counseling: not indicated; screening negative for depression     Seat Belt Use: always  Current Medications (verified): 1)  Prenatal Vitamins 0.8 Mg Tabs (Prenatal Multivit-Min-Fe-Fa) .... Take One Tablet Daily  Allergies: 1)  Sudafed (Pseudoephedrine Hcl)  Family History: Sister with scoliosis Grandfather - unknown kind of cancer mom with arrythmia  uncle with mi at age 22 asthma - history  No female cancers  No DM in 1st degree relatives.  Social History: Risk analyst Use:  always Education:  some college Hepatitis Risk:  no  Physical Exam  General:  Well-developed,well-nourished,in no acute distress; alert,appropriate and cooperative throughout examination. Vitals noted. Head:  Normocephalic and atraumatic without obvious abnormalities. No apparent alopecia or balding. Eyes:  No corneal or conjunctival inflammation noted.l. Mouth:  Oral mucosa and oropharynx without lesions or exudates.  Teeth in good  repair. Neck:  No deformities, masses, or tenderness noted.  No thyromegaly. Breasts:  No mass, nodules, thickening, tenderness, bulging, retraction, inflamation, nipple discharge or skin changes noted.   Lungs:  Normal respiratory effort, chest expands symmetrically. Lungs are clear to auscultation, no crackles or wheezes. Heart:  Normal rate and regular rhythm. S1 and S2 normal without gallop, murmur, click, rub or other extra sounds. Abdomen:  Bowel sounds positive,abdomen soft and non-tender without masses, organomegaly or hernias noted. Genitalia:  Normal introitus for age, no external lesions, scant white vaginal discharge, mucosa pink and moist, no vaginal or cervical lesions, no vaginal atrophy, no friaility or hemorrhage, uterus c/w with 10 weeks, no adnexal masses or tenderness Cervical Nodes:  No lymphadenopathy noted Psych:  Cognition and judgment appear intact. Alert and cooperative with normal attention span and concentration. No apparent delusions, illusions, hallucinations   Impression & Recommendations:  Problem # 1:  PREGNANCY, NORMAL (ICD-V22.2) Doing well.  Good dates with 6 week sono (irreg menses).  No need for early glucola.  Here with partner.  Rubella non-immune.  Would like integrated screening; will schedule through [facility].  Also interested in CFscreening.  Reviewed bleeding precautions.  Follow up 4 weeks wit Dr. Tye Savoy.  Flu shot today. Pregnancy medical home forms completed.  No concerns, including no DV.  PHQ-9 score 3 Orders: GC/Chlamydia-[facility] (87591/87491) Obstetric Referral (Obstetric) Medicaid OB visit - [facility] ([address]109)  Problem # 2:  ABNORMAL PAP SMEAR, LGSIL (ICD-795.09) Needs pap postpartum.  Deferred today.  Complete Medication List: 1)  Prenatal Vitamins 0.8 Mg Tabs (Prenatal multivit-min-fe-fa) .... Take one tablet daily  Patient Instructions: 1)  Congratulations! 2)  If you have bleeding or cramping, please let us know. 3)  Please schedule a follow  up visit with Dr. Tye Savoy in 4 weeks. 4)  We will set up the appointment for the integrated screen.  If you don't hear from [place] in 1 week, please call us to check on that. 5)  You might want to check out [email or web address] for information about pregnancy. [facility] also offers some great classes for you and [place].  You can find out about those on our website [email or web address]   Orders Added: 1)  GC/Chlamydia-[facility] [87591/87491] 2)  Obstetric Referral [Obstetric] 3)  Medicaid OB visit - [facility] [[id number]]    Prenatal Visit    FOB name: Elita Quick Padin Concerns noted: None EDC Confirmation:    New working [facility]: 02/19/2011    LMP reliable? Yes    Last menses onset (LMP) date: 05/09/2010    EDC by LMP: 02/13/2011 Ultrasound Dating Information:    First U/S on 06/28/2010   Gest age: 87 and 2/7   EDC: 02/19/2011.   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 Tye Savoy  FOB Information    Husband/Father of baby: Elita Quick Giuliani    FOB occupation Waiter    FOB Comments: Lives with patient.  Involved.  Menstrual History    LMP (date): 05/09/2010    EDC by LMP: 02/13/2011    Best Working EDC: 02/19/2011    LMP - Character: light    LMP - Reliable? : Yes    Symptoms since LMP: amenorrhea, fatigue, tender breasts   Past Pregnancy History  Pregnancy # 1    Delivery date:     12/09/2008    Delivery type:     TAB   Genetic History    Father of baby:   Jose Carr     Thalassemia:     mother: no   father: no    Neural tube defect:   mother: no   father: no    Down's Syndrome:   mother: no   father:  no    Tay-Sachs:     mother: no   father: no    Sickle Cell Dz/Trait:   mother: no   father: no    Hemophilia:     mother: no   father: no    Muscular Dystrophy:   mother: no   father: no    Cystic Fibrosis:   mother: no  father: no   comments: sister is pregnant, pregnancy may be affected by CF    Huntington's Dz:   mother: no   father: no    Mental Retardation:   mother: no   father: no    Fragile X:     mother: no   father: no    Other Genetic or       Chromosomal Dz:   mother: no   father: no    Child with other       birth defect:     mother: no   father: no    > 3 spont. abortions:   mother: no    Hx of stillbirth:     mother: no  Additional Genetic Comments:    Pt would like integrated screen testing and CF testing  Infection Risk History    High Risk Hepatitis B: no    Immunized against Hepatitis B: yes    Exposure to TB: no    Patient with history of Genital Herpes: no    Sexual partner with history of Genital Herpes: no    History of STD (GC, Chlamydia, Syphilis, HPV): yes    Specific STD: HPV    Rash, Viral, or Febrile Illness since LMP: no    Exposure to Cat Litter: no    Chicken Pox Immune Status: Hx of Disease: Immune    History of Parvovirus (Fifth Disease): no    Occupational Exposure to Children: none  Environmental Exposures    Xray Exposure since LMP: no    Chemical or other exposure: no    Medication, drug, or alcohol use since LMP: no

## 2010-10-13 NOTE — Assessment & Plan Note (Signed)
Summary: positive pregnancy test/[facility]c   Vital Signs:  Patient profile:   25 year old female Weight:      116.5 pounds Temp:     98.8 degrees F oral Pulse rate:   76 / minute Pulse rhythm:   regular BP sitting:   118 / 76  (left arm) Cuff size:   regular  Vitals Entered By: Loralee Pacas CMA (June 21, 2010 8:51 AM) CC: opthalmology referral Comments pt stated that she had 2 positive pregnancy tests and would like for one to be done today. also she would like to be checked for asthma.  Vision Screening:Left eye w/o correction: 20 / 25 Right Eye w/o correction: 20 / 25 Both eyes w/o correction:  20/ 25        Vision Entered By: Loralee Pacas CMA (June 21, 2010 8:59 AM)   Primary Care Provider:  Delbert Harness MD  CC:  opthalmology referral.  History of Present Illness: 25 yo here for multiple issues:  positive pregnancy test": missed one period LMP 05/09/10.  Was not on OCP due to cost.  Had been having irregular periods prior to this.  Plans to contineu with pregnancy  Eye exam:  asks if there are any optometrists.  Had been going to optometrist for contact lenses.  Would like to see one covered by [facility].  No vision problems.  Wants to be checked for asthma: has asthma as a child.  Has been trying to work out but feels short of breath with exercise.  no nocturnal cough, sob other than with exercise.  Habits & Providers  Alcohol-Tobacco-Diet     Tobacco Status: never  Current Medications (verified): 1)  Proventil Hfa 108 (90 Base) Mcg/act Aers (Albuterol Sulfate) .... Take 2 Puff 10-15 Minutes Before Exercise 2)  Prenatal Vitamins 0.8 Mg Tabs (Prenatal Multivit-Min-Fe-Fa) .... Take One Tablet Daily  Allergies: 1)  Sudafed (Pseudoephedrine Hcl) PMH-FH-SH reviewed-no changes except otherwise noted  Social History: Did graduate from [employer].  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  Denies smoking, and ilicit drugs  use. Occasional ETOH.   Review of Systems      See HPI  Physical Exam  General:  VS reviewed.  Otherwise NAD. Lungs:  Normal respiratory effort, chest expands symmetrically. Lungs are clear to auscultation, no crackles or wheezes. Heart:  Normal rate and regular rhythm. S1 and S2 normal without gallop, murmur, click, rub or other extra sounds. Abdomen:  Bowel sounds positive,abdomen soft and non-tender without masses, organomegaly or hernias noted.   Impression & Recommendations:  Problem # 1:  PREGNANCY EXAMINATION OR TEST POSITIVE RESULT (ICD-V72.42) LMP would make her about 6 weeks.  Irregular periods and last one was atypical, will get [place] for dating.  patient to get pregnancy care here.  Given bag of information and Lupita Leash to arrange for initial labs once dating confirmed.  Orders: Prenatal U/S < 14 weeks - 16109  (Prenatal U/S) [facility]- Est  Level 4 (60454)  Problem # 2:  ASTHMA, EXERCISE INDUCED (ICD-493.81)  This problem was in centricity pre-existing now.  Discussed possibility of just deconditioning or now pregnancy adding to fatigue.  Will attempt trial of albuterol.  Encouraged regular exercise.  Her updated medication list for this problem includes:    Proventil Hfa 108 (90 Base) Mcg/act Aers (Albuterol sulfate) .Marland Kitchen... Take 2 puff 10-15 minutes before exercise  Orders: [facility]- Est  Level 4 (09811)  Problem # 3:  VISUAL ACUITY, DECREASED (ICD-369.9)  wears contact lenses.  will refer to [facility]  Orders: [facility]- Est  Level 4 (99214) Misc. Referral (Misc. Ref)  Complete Medication List: 1)  Proventil Hfa 108 (90 Base) Mcg/act Aers (Albuterol sulfate) .... Take 2 puff 10-15 minutes before exercise 2)  Prenatal Vitamins 0.8 Mg Tabs (Prenatal multivit-min-fe-fa) .... Take one tablet daily  Other Orders: U Preg-[facility] (16109)  Patient Instructions: 1)  Your pregnancy test is positive.   2)  For last period would make your due date February 13, 2011 3)  It's  important to take your prenatal vitamins every day 4)  Lupita Leash will call and schedule you for your lab appointment 5)  Will refer you to optometry- call office if you dont hear anythig in 2 weeks 6)  Will get ultrasound for dating Prescriptions: PRENATAL VITAMINS 0.8 MG TABS (PRENATAL MULTIVIT-MIN-FE-FA) take one tablet daily  #30 x 6   Entered and Authorized by:   Delbert Harness MD   Signed by:   Delbert Harness MD on 06/21/2010   Method used:   Electronically to        [facility]  [address] [phone]* (retail)       [address]       [place], [place]  [address]       Ph: [phone] or [phone]       Fax: [phone]   RxID:   [id number] PROVENTIL HFA 108 (90 BASE) MCG/ACT AERS (ALBUTEROL SULFATE) take 2 puff 10-15 minutes before exercise  #1 x 1   Entered and Authorized by:   Delbert Harness MD   Signed by:   Delbert Harness MD on 06/21/2010   Method used:   [facility]        [facility]  [address] [phone]* (retail)       [address]       [place], [place]  [address]       Ph: [phone] or [phone]       Fax: [phone]   RxID:   [id number]   Laboratory Results   Urine Tests  Date/Time Received: June 21, 2010 9:07 AM  Date/Time Reported: June 21, 2010 9:21 AM     Urine HCG: positive Comments: ...............test performed by......Marland KitchenBonnie A. Swaziland, MLS (ASCP)cm

## 2010-10-13 NOTE — Letter (Signed)
Summary: Out of Work  [facility]  [address]   [place], [place] [address]   Phone: [phone]  Fax: [phone]    Jan 26, 2010   Employee:  Alejandra Conway    To Whom It May Concern:   For Medical reasons, please excuse the above named employee from work for the following dates:  Start:  Jan 26, 2010   End:  Jan 26, 2010  Okay to return to work now.  If you need additional information, please feel free to contact our office.         Sincerely,    Romero Belling MD

## 2010-10-13 NOTE — Assessment & Plan Note (Signed)
Summary: F/U/KH   Vital Signs:  Patient profile:   25 year old female Height:      61.5 inches Weight:      114.4 pounds BMI:     21.34 Temp:     98.1 degrees F oral Pulse rate:   73 / minute BP sitting:   117 / 78  (left arm) Cuff size:   regular  Vitals Entered By: Garen Grams LPN (Feb 02, 2010 9:22 AM) CC: wart treatment Is Patient Diabetic? No Pain Assessment Patient in pain? no        Primary Care Provider:  Delbert Harness MD  CC:  wart treatment.  History of Present Illness: 25 yo female here for podophyllin treatment of genital warts.  Treated once in March and again last week.  Much improved since last week.  Was irritated for a few days after last treatment, but was tolerable.  PODOPHYLLIN TREATMENT.  Negative pregnancy test confirmed.  Verbal consent obtained.  Scant podophyllyn applied with swab to warts:  5 x 1mm warts at 11 o'clock, 1 x 1mm wart at 5 o'clock, 1 x 1mm wart at 7 o'clock, 0.5 cm line of 1mm warts at 4 o'clock.  Petrolatum jelly and gauze applied.  Patient tolerated well.  Told to remove dressing in 2 hours. wash area with soap and water. Drops of podophyllin used.  Habits & Providers  Alcohol-Tobacco-Diet     Tobacco Status: never  Allergies: 1)  Sudafed (Pseudoephedrine Hcl)  Physical Exam  General:  Well-developed,well-nourished,in no acute distress; alert,appropriate and cooperative throughout examination Genitalia:  Unchanged from prior.  Normal external female genitalia. 5 x 1mm warts at 11 o'clock, 1 x 1mm wart at 5 o'clock, 1 x 1mm wart at 7 o'clock, 0.5 cm line of 1mm warts at 4 o'clock.    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  Podophyllin today.  Excellent results from last treatment.  RTC 1 month for repeat, sooner if warts grow or spread.  Orders: Provider Misc Charge- [facility] (Misc)  Complete Medication List: 1)  Yaz 3-0.02 Mg Tabs (Drospirenone-ethinyl estradiol) ....  Take one tablet at the same time daily  Other Orders: U Preg-[facility] (16109)  Patient Instructions: 1)  Please schedule a follow-up appointment in 1 month for repeat treatment--come sooner if warts start to grow or spread.  Laboratory Results   Urine Tests  Date/Time Received: Feb 02, 2010 9:31 AM  Date/Time Reported: Feb 02, 2010 9:37 AM     Urine HCG: negative Comments: ...............test performed by......Marland KitchenBonnie A. Swaziland, MLS (ASCP)cm

## 2010-10-13 NOTE — Miscellaneous (Signed)
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

## 2010-10-13 NOTE — Miscellaneous (Signed)
Summary: re: OB US AND OPTOMETRIST/TS   INFORMED PT OF [place] APPT AT [facility] 06-28-10 AT 10:15 AM AND ALSO, REFERRAL FOR OPTOMETRIST WAS FAXED.Arlyss Repress CMA,  June 21, 2010 1:44 PM

## 2010-10-18 ENCOUNTER — Encounter: Payer: Medicaid Other | Admitting: Family Medicine

## 2010-10-18 ENCOUNTER — Encounter: Payer: Self-pay | Admitting: Family Medicine

## 2010-10-27 NOTE — Assessment & Plan Note (Signed)
Summary: ob f/u [facility]   Alejandra Conway is a 25 years old old G: 1  P:   0 0 @ 22 2/[redacted] weeks gestation. She returns for follow up OB visit. Other stymptoms include: She has no complaints today.  Pregnancy going well.  Started to feel kicking/quickening.      Menstrual History LMP: 05/09/2010 Reliability: approximate (month known)    Menstrual regularity: regular      Pregnancy Dating     EDC by LMP: 02/13/2011   EGA by LMP: [redacted]W[redacted]D   EDC by 1st [place]: 02/19/2011   EGA by 1st [place]: [redacted]W[redacted]D      OB History Total Preg.: 1  Living Children: 0  Past Pregnancy History  Pregnancy #1  Delivery date: 12/09/2008  Delivery type: TAB    Family Hx: Sister with scoliosis Grandfather - unknown kind of cancer mom with arrythmia  uncle with mi at age 45 asthma - history  No female cancers  No DM in 1st degree relatives.   Social Hx: Did graduate from highschool.  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  Denies smoking, and ilicit drugs use. Occasional ETOH, not during pregnancy.   Prenatal Care Plan:  Pediatric Care: Dr. Sherron Flemings Conway (07/21/2010)   Pregnancy History Total Preg.: 1   Living: 0  Today's Evaluation EGA: [redacted]W[redacted]D  Fundal Ht: 21  FHT: 145  WEIGHT: 123  BP: 99/65 Edema: 0  Fetal Activity: yes    Social History Did graduate from highschool.  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  Denies smoking, and ilicit drugs use. Occasional ETOH, not during pregnancy.   Problems Assessed Assessed PREGNANCY, NORMAL as comment only - Alejandra Bermingham de la Cruz  MD  Allergies SUDAFED (PSEUDOEPHEDRINE HCL)             Prenatal Visit EDC Confirmation:    LMP reliable? approximate (month known)    EDC by LMP: 02/13/2011   Flowsheet View for Follow-up Visit    Estimated weeks of       gestation:     [redacted]W[redacted]D    Weight:     123    Blood pressure:   99 / 65    Headache:     few    Nausea/vomiting:   No    Edema:      0    Vaginal bleeding:   no    Vaginal discharge:   no    Fundal height:      21    FHR:       145    Fetal activity:     yes    Labor symptoms:   no    Flowsheet View for Follow-up Visit    Estimated weeks of       gestation:     [redacted]W[redacted]D    Weight:     123    Blood pressure:   99 / 65    Hx headache?     few    Nausea/vomiting?   No    Edema?     0    Bleeding?     no    Leakage/discharge?   no    Fetal activity:       yes    Labor symptoms?   no    Fundal height:      21    FHR:       145   Family History: Reviewed history  from 07/21/2010 and no changes required. Sister with scoliosis Grandfather - unknown kind of cancer mom with arrythmia  uncle with mi at age 55 asthma - history  No female cancers  No DM in 1st degree relatives.  Social History: Reviewed history from 06/21/2010 and no changes required. Did graduate from highschool.  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  Denies smoking, and ilicit drugs use. Occasional ETOH, not during pregnancy.   Review of Systems       per OB flowsheet   Vital Signs:  Patient profile:   25 Years Old Female Weight:      123 pounds Temp:     98.1 degrees F oral Pulse rate:   91 / minute Pulse rhythm:   regular BP sitting:   99 / 65 Cuff size:   regular   Impression & Recommendations:  Problem # 1:  PREGNANCY, NORMAL (ICD-V22.2) Pt doing well at 22.[redacted] weeks gestation.  Has no complaints.  Integrated screen was negative.  US showed no anatomical abnormality. No need for early glucola test.  Patient to follow up in 4 weeks.  Orders: Medicaid OB visit - [facility] (16109)  Complete Medication List: 1)  Prenatal Vitamins 0.8 Mg Tabs (Prenatal multivit-min-fe-fa) .... Take one tablet daily   Patient Instructions: 1)  It was great to see you today. 2)  You and baby seem to be doing well. 3)  Keep up the good work! 4)  I will see you for a follow up appt. in 4 weeks. 5)   Please call if you have any questions or concerns. 6)  Thank you.

## 2010-11-18 ENCOUNTER — Ambulatory Visit (INDEPENDENT_AMBULATORY_CARE_PROVIDER_SITE_OTHER): Payer: Medicaid Other | Admitting: Family Medicine

## 2010-11-18 ENCOUNTER — Encounter: Payer: Self-pay | Admitting: Family Medicine

## 2010-11-18 VITALS — BP 106/68 | Wt 137.0 lb

## 2010-11-18 DIAGNOSIS — Z3400 Encounter for supervision of normal first pregnancy, unspecified trimester: Secondary | ICD-10-CM

## 2010-11-18 LAB — CBC
Hemoglobin: 11.1 g/dL — ABNORMAL LOW (ref 12.0–15.0)
MCH: 30 pg (ref 26.0–34.0)
MCHC: 32.7 g/dL (ref 30.0–36.0)
MCV: 91.6 fL (ref 78.0–100.0)
Platelets: 185 10*3/uL (ref 150–400)
RBC: 3.7 MIL/uL — ABNORMAL LOW (ref 3.87–5.11)

## 2010-11-18 LAB — CONVERTED CEMR LAB
HCT: 33.9 % — ABNORMAL LOW (ref 36.0–46.0)
HIV: NONREACTIVE
MCV: 91.6 fL (ref 78.0–100.0)
Platelets: 185 10*3/uL (ref 150–400)
RDW: 13.9 % (ref 11.5–15.5)
WBC: 7.7 10*3/uL (ref 4.0–10.5)

## 2010-11-18 LAB — RPR

## 2010-11-18 NOTE — Patient Instructions (Signed)
It was great to see you today. You and baby look great. Please continue to eat enough food for you and baby.  You may gain up to 25-35lbs throughout your pregnancy. I will let you know the results of your blood work at your next visit. Please continue to take your Prenatal vitamins. Please schedule an appointment to see me in 2 weeks. Thanks, Dr. Sherron Flemings Sondra Come

## 2010-11-19 LAB — HIV ANTIBODY (ROUTINE TESTING W REFLEX): HIV: NONREACTIVE

## 2010-11-21 ENCOUNTER — Encounter: Payer: Self-pay | Admitting: Family Medicine

## 2010-11-21 NOTE — Progress Notes (Signed)
  Subjective:    Patient ID: Conan Bowens, female    DOB: 05-Jan-1986, 25 y.o.   MRN: [id number]  HPI    Review of Systems  Constitutional: Negative for fever, activity change and appetite change.  Respiratory: Negative for shortness of breath.   Cardiovascular: Negative for chest pain and leg swelling.  Gastrointestinal: Negative for nausea, vomiting, diarrhea and constipation.  Genitourinary: Positive for frequency. Negative for dysuria, vaginal bleeding, vaginal discharge, difficulty urinating and vaginal pain.  Skin: Negative.   Neurological: Negative for headaches.       Objective:   Physical Exam  Constitutional: She appears well-developed and well-nourished.  HENT:  Head: Normocephalic and atraumatic.   Abdomen: hypoactive BS, non-tender, gravid, consistent with dates FHR: 148 Fundal height: 27       Assessment & Plan:  Patient 27.[redacted] weeks gestation today.  Doing well.  No complaints.  Headaches have improved.  Good fetal movement.  Denies any N/V, edema, vaginal bleeding, discharge.  No contractions.  Patient will get 1hr GTT today.  Follow-up in 2 weeks.

## 2010-11-21 NOTE — Assessment & Plan Note (Deleted)
Patient 27.[redacted] weeks gestation today.  Doing well.  No complaints.  Headaches have improved.  Good fetal movement.  Denies any N/V, edema, vaginal bleeding, discharge.  No contractions.  Patient will get 1hr GTT today.  Follow-up in 2 weeks.

## 2010-11-25 LAB — URINALYSIS, ROUTINE W REFLEX MICROSCOPIC
Glucose, UA: NEGATIVE mg/dL
Ketones, ur: NEGATIVE mg/dL
Protein, ur: 100 mg/dL — AB
Urobilinogen, UA: 1 mg/dL (ref 0.0–1.0)

## 2010-11-25 LAB — URINE CULTURE
Colony Count: >=100000
Culture  Setup Time: 201108091451

## 2010-11-25 LAB — URINE MICROSCOPIC-ADD ON

## 2010-11-30 ENCOUNTER — Ambulatory Visit (INDEPENDENT_AMBULATORY_CARE_PROVIDER_SITE_OTHER): Payer: Medicaid Other | Admitting: Family Medicine

## 2010-11-30 ENCOUNTER — Encounter: Payer: Self-pay | Admitting: Family Medicine

## 2010-11-30 DIAGNOSIS — Z3400 Encounter for supervision of normal first pregnancy, unspecified trimester: Secondary | ICD-10-CM

## 2010-11-30 NOTE — Patient Instructions (Signed)
Follow-up with Dr. Tye Savoy or OB clinic in 2 weeks Your lab work looked normal

## 2010-11-30 NOTE — Progress Notes (Signed)
  Subjective:    Patient ID: Alejandra Conway, female    DOB: 1985/10/02, 25 y.o.   MRN: [id number]  HPI    Review of Systems     Objective:   Physical Exam        Assessment & Plan:  G1P0 @ 28 w 3 days.  Reviewed labwork from 11/17/10- normal hgb, RPR NR, HIV NR, glucola normal.  Also reviewed initital dating- patient's EDC is 02/19/11, updated in EMR.  Good weight gain.  Discussed kick counts, pre-term labor precautions.  Follow-up in 2 weeks

## 2010-11-30 NOTE — Progress Notes (Signed)
Please change PCP to Dr. Frederich Balding who is her [facility] provider.  Thanks

## 2010-12-14 ENCOUNTER — Encounter: Payer: Self-pay | Admitting: Family Medicine

## 2010-12-14 DIAGNOSIS — Z789 Other specified health status: Secondary | ICD-10-CM | POA: Insufficient documentation

## 2010-12-15 ENCOUNTER — Ambulatory Visit (INDEPENDENT_AMBULATORY_CARE_PROVIDER_SITE_OTHER): Payer: Medicaid Other | Admitting: Family Medicine

## 2010-12-15 VITALS — BP 114/67 | Wt 134.2 lb

## 2010-12-15 DIAGNOSIS — Z3400 Encounter for supervision of normal first pregnancy, unspecified trimester: Secondary | ICD-10-CM

## 2010-12-15 NOTE — Assessment & Plan Note (Signed)
Doing well, good fetal movement. Measurements c/w dates. Weight gain looks ok Denies LOF, vaginal bleeding. Planning on breast feeding Reviewed pre-term labor precautions and kick counts F/u in two weeks with PCP.

## 2010-12-15 NOTE — Patient Instructions (Signed)
It was great seeing you today, your baby sounds great and you are measuring well.  I would like for you to follow up in 2 weeks with Dr. Tye Savoy. Remember if you have contractions or abdominal pain that feels like tightening and is regular (<98min apart) or if you have leaking of fluid or bleeding from your vagina or you feel like your baby is not moving like normal even after doing the kick counts, you should go to the MAU at [facility].

## 2010-12-15 NOTE — Progress Notes (Signed)
Here at  30w 4/7 days.  Doing well. Some occasional tightness in lower abdomen.  Nothing frequent or persistent.  Denies LOF or vaginal bleeding.  Fetus is very active. Has occasional headache, no vision change, shortness of breath, easy bruising, swelling.    BP looks good today, fetus vertex at this point on ultrasound.   Measurements c/w dates Discussed PTL  Precautions and kick count Return in two weeks with PCP Has dx of asthma, avoid hemabate Rubella equivocal will need vaccine after delivery.

## 2010-12-30 ENCOUNTER — Ambulatory Visit (INDEPENDENT_AMBULATORY_CARE_PROVIDER_SITE_OTHER): Payer: Medicaid Other | Admitting: Family Medicine

## 2010-12-30 VITALS — BP 109/68 | Temp 98.2°F | Wt 138.9 lb

## 2010-12-30 DIAGNOSIS — Z3400 Encounter for supervision of normal first pregnancy, unspecified trimester: Secondary | ICD-10-CM

## 2010-12-30 NOTE — Patient Instructions (Signed)
It was great seeing you today. You and baby appear to be growing well.  Please go to the MAU if:    1) you have contractions or abdominal pain that feels like tightening and is regular (<4min apart)    2) you have leaking of fluid or bleeding from your vagina     3) you feel like your baby is not moving or kicking even after doing the kick counts Please follow up in 2 weeks with Dr. Tye Savoy and call MD if you have any concerns. Thank you!

## 2011-01-08 ENCOUNTER — Inpatient Hospital Stay (HOSPITAL_COMMUNITY)
Admission: AD | Admit: 2011-01-08 | Discharge: 2011-01-08 | Disposition: A | Discharge status: Home / Self Care | Payer: Medicaid Other | Source: Ambulatory Visit | Attending: Family Medicine | Admitting: Family Medicine

## 2011-01-08 DIAGNOSIS — O368190 Decreased fetal movements, unspecified trimester, not applicable or unspecified: Secondary | ICD-10-CM

## 2011-01-10 ENCOUNTER — Encounter: Payer: Self-pay | Admitting: Family Medicine

## 2011-01-10 NOTE — Progress Notes (Signed)
Patient is here at 54 and 5/7 weeks.  Doing well.  BP within normal limits.  No complaints of HA, vaginal bleeding, leaking fluid, dysuria, SOB.  PHQ-9 score: 2.  GTT was 76.  Rubella equivocal.  Plans to breast feed.  Good weight gain.   FHT: 145-150 Fundal height: 31 cm

## 2011-01-10 NOTE — Assessment & Plan Note (Signed)
Here at 32w 5/7 days. Doing well.  Denies headaches, vaginal bleeding, discharge, or dysuria.  No contractions yet, but good fetal activity.  BP currently stable.  Measurements c/w dates and FHT within normal limits.  Discussed PTL Precautions and kick counts.  Patient to return in two weeks.  Will obtain GBS culture, GC/Chlamydia at 36 weeks.  Remember to avoid Hemabate and patient will need Rubella vaccine after delivery.

## 2011-01-13 ENCOUNTER — Ambulatory Visit (INDEPENDENT_AMBULATORY_CARE_PROVIDER_SITE_OTHER): Payer: Medicaid Other | Admitting: Family Medicine

## 2011-01-13 VITALS — BP 99/64 | Temp 97.8°F | Wt 138.0 lb

## 2011-01-13 DIAGNOSIS — Z3400 Encounter for supervision of normal first pregnancy, unspecified trimester: Secondary | ICD-10-CM

## 2011-01-13 MED ORDER — FLUTICASONE PROPIONATE 50 MCG/ACT NA SUSP: 1.0000 | Freq: Every day | NASAL | Status: DC

## 2011-01-13 NOTE — Patient Instructions (Signed)
It was so good to see you. You and baby are doing well. Please schedule a follow up appointment @ OB clinic in 2 weeks with Dr. Mauricio Po or Dr. [place]. Continue to perform kick counts. Okay to take Tylenol for pelvic pain. If you experience vaginal bleeding, bloody show, or leaking fluid, please go to [facility]. Thanks!

## 2011-01-22 ENCOUNTER — Encounter: Payer: Self-pay | Admitting: Family Medicine

## 2011-01-22 NOTE — Assessment & Plan Note (Signed)
Doing well, good fetal movement. Measurements c/w dates. Normal weight gain. Denies LOF, vaginal bleeding. Flonase given for allergies. Reviewed pre-term labor precautions and kick counts.   F/u in 2 weeks @ OB clinic.

## 2011-01-22 NOTE — Progress Notes (Signed)
Patient is here at 50 and 5/7 weeks.   Went to MAU last week because she did not feel baby moving.  Patient was monitored for 45 minutes, baby moved, and NST was normal.   Complains of some nausea with contractions.  Pain is sharp at times but resolves.   Advised patient to take Tylenol for pain. Complains of seasonal allergies.  Will give Flonase. Will need Rubella vaccine post-partum. Patient to follow up in OB clinic in 2 weeks w/ Dr. Mauricio Po or Dr. [place]. Will need GBS and repeat cultures at next visit.

## 2011-01-26 ENCOUNTER — Encounter: Payer: Medicaid Other | Admitting: Family Medicine

## 2011-01-26 ENCOUNTER — Ambulatory Visit (INDEPENDENT_AMBULATORY_CARE_PROVIDER_SITE_OTHER): Payer: Medicaid Other | Admitting: Family Medicine

## 2011-01-26 VITALS — BP 114/73 | Temp 98.2°F | Wt 141.5 lb

## 2011-01-26 DIAGNOSIS — Z3400 Encounter for supervision of normal first pregnancy, unspecified trimester: Secondary | ICD-10-CM

## 2011-01-26 LAB — CBC
Hemoglobin: 11.2 g/dL — ABNORMAL LOW (ref 12.0–15.0)
MCH: 29.2 pg (ref 26.0–34.0)
MCHC: 32.8 g/dL (ref 30.0–36.0)
Platelets: 168 10*3/uL (ref 150–400)
RBC: 3.83 MIL/uL — ABNORMAL LOW (ref 3.87–5.11)

## 2011-01-27 LAB — GC/CHLAMYDIA PROBE AMP, GENITAL
Chlamydia, DNA Probe: NEGATIVE
GC Probe Amp, Genital: NEGATIVE

## 2011-01-27 NOTE — Progress Notes (Signed)
Patient is here at 66 and 4/7 weeks.   GBS, GC/Chlamydia, and CBC all performed at this visit. Will review results of lab studies at next visit. Will need Rubella vaccine post-partum. Patient plans to breast feed and use OCP for contraception. Patient to follow up in 1 week at [facility] clinic with Dr. Mauricio Po or Dr. [place]. Will perform an ultrasound to confirm vertex position at next visit.

## 2011-01-27 NOTE — Assessment & Plan Note (Addendum)
Doing well.  Measurements c/w dates. Normal weight gain. Denies vaginal bleeding, LOF, discharge, HA, SOB, or N/V. Endorses irregular contractions and good fetal activity. Reviewed pre-term labor precautions, kick counts. Reviewed red flags that require visit to MAU.   F/u in one week at [facility] clinic - will likely perform U/S to confirm vertex position.

## 2011-01-27 NOTE — Patient Instructions (Signed)
It was so good to see you. You and baby are doing well. I will review results of lab studies with you at next visit. Please schedule a follow up appointment @ OB clinic in 1-2 weeks with Dr. Mauricio Po or Dr. [place]. Continue to perform kick counts. Okay to take Tylenol for pelvic pain. If you experience vaginal bleeding, bloody show, or leaking fluid, please go to [facility]. Thanks!

## 2011-01-28 LAB — STREP B DNA PROBE: GBSP: NEGATIVE

## 2011-02-02 ENCOUNTER — Ambulatory Visit (INDEPENDENT_AMBULATORY_CARE_PROVIDER_SITE_OTHER): Payer: Medicaid Other | Admitting: Family Medicine

## 2011-02-02 DIAGNOSIS — Z3400 Encounter for supervision of normal first pregnancy, unspecified trimester: Secondary | ICD-10-CM

## 2011-02-02 NOTE — Progress Notes (Signed)
37+4 weeks today GBC, GC/Chl neg Discussed breastfeeding, circumcision (plans to do at ambulatory site TBD), labor precautions. Will f/u in 1 week with OB clinic, 2 weeks with Dr. Stevphen Rochester

## 2011-02-02 NOTE — Patient Instructions (Signed)
Follow-up in 1 week as scheduled Follow-up in 2 weeks with Dr. Tye Savoy- June 8th  .Pregnancy - Third Trimester The third trimester of pregnancy (the last 3 months) is a period of the most rapid growth for you and your baby. The baby approaches a length of 20 inches and a weight of 6 to 10 pounds. The baby is adding on fat and getting ready for life outside your body. While inside, babies have periods of sleeping and waking, suck their thumbs, and hiccups. You can often feel small contractions of the uterus. This is false labor. It is also called Braxton-Hicks contractions. This is like a practice for labor. The usual problems in this stage of pregnancy include more difficulty breathing, swelling of the hands and feet from water retention, and having to urinate more often because of the uterus and baby pressing on your bladder.   PRENATAL EXAMS  Blood work may continue to be done during prenatal exams. These tests are done to check on your health and the probable health of your baby. Blood work is used to follow your blood levels (hemoglobin). Anemia (low hemoglobin) is common during pregnancy. Iron and vitamins are given to help prevent this. You may also continue to be checked for diabetes. Some of the past blood tests may be done again.   The size of the uterus is measured during each visit. This makes sure your baby is growing properly according to your pregnancy dates.   Your blood pressure is checked every prenatal visit. This is to make sure you are not getting toxemia.   Your urine is checked every prenatal visit for infection, diabetes and protein.   Your weight is checked at each visit. This is done to make sure gains are happening at the suggested rate and that you and your baby are growing normally.   Sometimes, an ultrasound is performed to confirm the position and the proper growth and development of the baby. This is a test done that bounces harmless sound waves off the baby so your  caregiver can more accurately determine due dates.   Discuss the type of pain medication and anesthesia you will have during your labor and delivery.   Discuss the possibility and anesthesia if a Cesarean Section might be necessary.   Inform your caregiver if there is any mental or physical violence at home.  Sometimes, a specialized non-stress test, contraction stress test and biophysical profile are done to make sure the baby is not having a problem. Checking the amniotic fluid surrounding the baby is called an amniocentesis. The amniotic fluid is removed by sticking a needle into the belly (abdomen). This is sometimes done near the end of pregnancy if an early delivery is required. In this case, it is done to help make sure the baby's lungs are mature enough for the baby to live outside of the womb. If the lungs are not mature and it is unsafe to deliver the baby, an injection of cortisone medication is given to the mother 1 to 2 days before the delivery. This helps the baby's lungs mature and makes it safer to deliver the baby. CHANGES OCCURING IN THE THIRD TRIMESTER OF PREGNANCY Your body goes through many changes during pregnancy. They vary from person to person. Talk to your caregiver about changes you notice and are concerned about.  During the last trimester, you have probably had an increase in your appetite. It is normal to have cravings for certain foods. This varies from  person to person and pregnancy to pregnancy.   You may begin to get stretch marks on your hips, abdomen, and breasts. These are normal changes in the body during pregnancy. There are no exercises or medications to take which prevent this change.   Constipation may be treated with a stool softener or adding bulk to your diet. Drinking lots of fluids, fiber in vegetables, fruits, and whole grains are helpful.   Exercising is also helpful. If you have been very active up until your pregnancy, most of these activities can  be continued during your pregnancy. If you have been less active, it is helpful to start an exercise program such as walking. Consult your caregiver before starting exercise programs.   Avoid all smoking, alcohol, un-prescribed drugs, herbs and "street drugs" during your pregnancy. These chemicals affect the formation and growth of the baby. Avoid chemicals throughout the pregnancy to ensure the delivery of a healthy infant.   Backache, varicose veins and hemorrhoids may develop or get worse.   You will tire more easily in the third trimester, which is normal.   The baby's movements may be stronger and more often.   You may become short of breath easily.   Your belly button may stick out.   A yellow discharge may leak from your breasts called colostrum.   You may have a bloody mucus discharge. This usually occurs a few days to a week before labor begins.  HOME CARE INSTRUCTIONS  Keep your caregiver's appointments. Follow your caregiver's instructions regarding medication use, exercise, and diet.   During pregnancy, you are providing food for you and your baby. Continue to eat regular, well-balanced meals. Choose foods such as meat, fish, milk and other low fat dairy products, vegetables, fruits, and whole-grain breads and cereals. Your caregiver will tell you of the ideal weight gain.   A physical sexual relationship may be continued throughout pregnancy if there are no other problems such as early (premature) leaking of amniotic fluid from the membranes, vaginal bleeding, or belly (abdominal) pain.   Exercise regularly if there are no restrictions. Check with your caregiver if you are unsure of the safety of your exercises. Greater weight gain will occur in the last 2 trimesters of pregnancy. Exercising helps:   Control your weight.   Get you in shape for labor and delivery.   You lose weight after you deliver.   Rest a lot with legs elevated, or as needed for leg cramps or low back  pain.   Wear a good support or jogging bra for breast tenderness during pregnancy. This may help if worn during sleep. Pads or tissues may be used in the bra if you are leaking colostrum.   Do not use hot tubs, steam rooms, or saunas.   Wear your seat belt when driving. This protects you and your baby if you are in an accident.   Avoid raw meat, cat litter boxes and soil used by cats. These carry germs that can cause birth defects in the baby.   It is easier to loose urine during pregnancy. Tightening up and strengthening the pelvic muscles will help with this problem. You can practice stopping your urination while you are going to the bathroom. These are the same muscles you need to strengthen. It is also the muscles you would use if you were trying to stop from passing gas. You can practice tightening these muscles up 10 times a set and repeating this about 3 times per day. Once  you know what muscles to tighten up, do not perform these exercises during urination. It is more likely to cause an infection by backing up the urine.   Ask for help if you have financial, counseling or nutritional needs during pregnancy. Your caregiver will be able to offer counseling for these needs as well as refer you for other special needs.   Make a list of emergency phone numbers and have them available.   Plan on getting help from family or friends when you go home from the hospital.   Make a trial run to the hospital.   Take prenatal classes with the father to understand, practice and ask questions about the labor and delivery.   Prepare the baby's room/nursery.   Do not travel out of the city unless it is absolutely necessary and with the advice of your caregiver.   Wear only low or no heal shoes to have better balance and prevent falling.  MEDICATIONS AND DRUG USE IN PREGNANCY  Take prenatal vitamins as directed. The vitamin should contain 1 milligram of folic acid. Keep all vitamins out of reach of  children. Only a couple vitamins or tablets containing iron may be fatal to a baby or young child when ingested.   Avoid use of all medications, including herbs, over-the-counter medications, not prescribed or suggested by your caregiver. Only take over-the-counter or prescription medicines for pain, discomfort, or fever as directed by your caregiver. Do not use aspirin, ibuprofen (Motrin, Advil, Nuprin) or naproxen (Aleve) unless OK'd by your caregiver.   Let your caregiver also know about herbs you may be using.   Alcohol is related to a number of birth defects. This includes fetal alcohol syndrome. All alcohol, in any form, should be avoided completely. Smoking will cause low birth rate and premature babies.   Street/illegal drugs are very harmful to the baby. They are absolutely forbidden. A baby born to an addicted mother will be addicted at birth. The baby will go through the same withdrawal an adult does.  SEEK MEDICAL CARE IF  You have any concerns or worries during your pregnancy. It is better to call with your questions if you feel they cannot wait, rather than worry about them.  DECISIONS ABOUT CIRCUMCISION You may or may not know the sex of your baby. If you know your baby is a boy, it may be time to think about circumcision. Circumcision is the removal of the foreskin of the penis. This is the skin that covers the sensitive end of the penis. There is no proven medical need for this. Often this decision is made on what is popular at the time or based upon religious beliefs and social issues. You can discuss these issues with your caregiver or pediatrician. SEEK IMMEDIATE MEDICAL CARE IF:  An unexplained oral temperature above 101 develops, or as your caregiver suggests.   You have leaking of fluid from the vagina (birth canal). If leaking membranes are suspected, take your temperature and tell your caregiver of this when you call.   There is vaginal spotting, bleeding or passing  clots. Tell your caregiver of the amount and how many pads are used.   You develop a bad smelling vaginal discharge with a change in the color from clear to white.   You develop vomiting that lasts more than 24 hours.   You develop chills or fever.   You develop shortness of breath.   You develop burning on urination.   You loose more than 2  pounds of weight or gain more than 2 pounds of weight or as suggested by your caregiver.   You notice sudden swelling of your face, hands, and feet or legs.   You develop belly (abdominal) pain. Round ligament discomfort is a common non-cancerous (benign) cause of abdominal pain in pregnancy. Your caregiver still must evaluate you.   You develop a severe headache that does not go away.   You develop visual problems, blurred or double vision.   If you have not felt your baby move for more than 1 hour. If you think the baby is not moving as much as usual, eat something with sugar in it and lie down on your left side for an hour. The baby should move at least 4 to 5 times per hour. Call right away if your baby moves less than that.   You fall, are in a car accident or any kind of trauma.   There is mental or physical violence at home.  Document Released: 08/22/2001 Document Re-Released: 06/25/2009 [facility] Patient Information 2011 [place], [place].

## 2011-02-09 ENCOUNTER — Ambulatory Visit (INDEPENDENT_AMBULATORY_CARE_PROVIDER_SITE_OTHER): Payer: Medicaid Other | Admitting: Family Medicine

## 2011-02-09 DIAGNOSIS — Z3480 Encounter for supervision of other normal pregnancy, unspecified trimester: Secondary | ICD-10-CM

## 2011-02-09 NOTE — Patient Instructions (Signed)
It was great to see you today. Everything looks great.  We would want you to go to [facility] if the baby is not moving well, if you have bleeding, if your water breaks, or if you have contractions every 5 minutes for 1 hour or more.  Please come back and see [place] in 1 week, or sooner if you need anything. You might try switching to lighter lotions for your skin.   For circumcision, you can call [facility] [place]) - [phone] or [facility] in [place] [phone]. If you want a doula during labor, you can try the [facility] at [phone] ext [phone].

## 2011-02-09 NOTE — Progress Notes (Signed)
38 4/7 weeks today.  Doing well.  C/o slightly itchy rash on abdomen.  Recently switched to Palmer's cream, Baby oil Gel and another thick lotion in attempt to prevent stretch marks.  Rash present for 3 days.  Denies fevers/chills/SOB.  Itching worse in hot shower, when wearing clothes across belly. Denies vaginal discharge. PE: Well appearing.  Vitals noted Skin: Fine, erythematous papules on abdomen.  Not primarily in striae.  No excoriations. A/P: 1. Term pregnancy.  Doing well.  KC/labor precautions reviewed.  GBS negative. Offered doula and childbirth education.  Pt declines. F/u 1 week, sooner prn. 2. Rash: not c/w PUPPS.  Possibly heat rash.  Advised changing creams and lotions to milder ones, RTC if worsens. 3. Contraception choice: OCPs 4. Infant feeding: breast 5. Rubella non-immune: needs post partum vaccine.  Pt aware. 6. Desires circ for baby boy.  Resources given.

## 2011-02-17 ENCOUNTER — Ambulatory Visit (INDEPENDENT_AMBULATORY_CARE_PROVIDER_SITE_OTHER): Payer: Medicaid Other | Admitting: Family Medicine

## 2011-02-17 DIAGNOSIS — Z3400 Encounter for supervision of normal first pregnancy, unspecified trimester: Secondary | ICD-10-CM

## 2011-02-17 DIAGNOSIS — Z331 Pregnant state, incidental: Secondary | ICD-10-CM

## 2011-02-17 LAB — POCT FERNING: Ferning, POC: NEGATIVE

## 2011-02-17 NOTE — Patient Instructions (Signed)
It was great to see you today. Everything looks great.  We would want you to go to [facility] if the baby is not moving well, if you have bleeding, if your water breaks, or if you have contractions every 5 minutes for 1 hour or more.  Please come back and see [place] in 1 week, or sooner if you need anything. If you have not delivered baby by June 11th, our nurse will schedule an appointment at [facility] for an NST and BPP. Continue to walk and get plenty of rest. Hopefully, I will see you this weekend!

## 2011-02-18 ENCOUNTER — Inpatient Hospital Stay (HOSPITAL_COMMUNITY)
Admission: AD | Admit: 2011-02-18 | Discharge: 2011-02-20 | DRG: 775 | Disposition: A | Discharge status: Home / Self Care | Payer: Medicaid Other | Source: Ambulatory Visit | Attending: Obstetrics & Gynecology | Admitting: Obstetrics & Gynecology

## 2011-02-18 DIAGNOSIS — O4290 Premature rupture of membranes, unspecified as to length of time between rupture and onset of labor, unspecified weeks of gestation: Secondary | ICD-10-CM

## 2011-02-18 LAB — CBC
Hemoglobin: 12.3 g/dL (ref 12.0–15.0)
MCHC: 34.5 g/dL (ref 30.0–36.0)
RDW: 14.6 % (ref 11.5–15.5)
WBC: 8.6 10*3/uL (ref 4.0–10.5)

## 2011-02-18 LAB — RPR: RPR Ser Ql: NONREACTIVE

## 2011-02-18 NOTE — Assessment & Plan Note (Signed)
Doing well.  Measurements c/w dates. Endorses irregular contractions and good fetal activity. Will perform nitrazine and fern test today. Reviewed red flags that require visit to MAU.   F/u in one week.

## 2011-02-18 NOTE — Progress Notes (Signed)
39 5/7 weeks today.  Ctx are more frequent, but irregular.  Patient thinks she experienced "bloody show" 2 days ago.   No gush of fluid, but some leaking.  She cannot tell if it was her typical vaginal discharge.  Will perform speculum exam and look for any pooling.  Will also perform nitrazine and fern test.   Rash resolved. KC and labor precautions reviewed.  Contraception choice: OCPs Infant feeding: breast Rubella non-immune: needs post partum vaccine.   Will schedule NST and BPP for early next week if still pregnant. Follow up in 1 week.

## 2011-02-23 ENCOUNTER — Encounter: Payer: Medicaid Other | Admitting: Family Medicine

## 2011-04-03 ENCOUNTER — Encounter: Payer: Self-pay | Admitting: Family Medicine

## 2011-04-03 ENCOUNTER — Ambulatory Visit (INDEPENDENT_AMBULATORY_CARE_PROVIDER_SITE_OTHER): Payer: Medicaid Other | Admitting: Family Medicine

## 2011-04-03 DIAGNOSIS — Z309 Encounter for contraceptive management, unspecified: Secondary | ICD-10-CM

## 2011-04-03 DIAGNOSIS — Z3400 Encounter for supervision of normal first pregnancy, unspecified trimester: Secondary | ICD-10-CM

## 2011-04-03 NOTE — Patient Instructions (Addendum)
It was great to see you again. I will send your OCP to your pharmacy. You can schedule your adult physical with me in the next year. Please call if you have any questions/concerns. Thank you!

## 2011-04-06 DIAGNOSIS — Z309 Encounter for contraceptive management, unspecified: Secondary | ICD-10-CM | POA: Insufficient documentation

## 2011-04-06 MED ORDER — DROSPIRENONE-ETHINYL ESTRADIOL 3-0.02 MG PO TABS: 1.0000 | ORAL_TABLET | Freq: Every day | ORAL | Status: DC

## 2011-04-06 NOTE — Assessment & Plan Note (Signed)
Post-partum visit - no concerns. No bleeding, pain, or constipation. PHQ 2 - passed; no evidence of depression. Formula feeding and desires to take OCP for birth control. Patient to return clinic in 1 year for annual adult physical.

## 2011-04-06 NOTE — Assessment & Plan Note (Signed)
Patient interested in taking OCPs. Will send generic Yaz to [facility]. If patient desires to switch to Nexplanon or Depo, advised her to return to clinic.

## 2011-04-06 NOTE — Progress Notes (Signed)
  Subjective:    Patient ID: Alejandra Conway, female    DOB: Oct 14, 1985, 25 y.o.   MRN: [id number]  HPI  25 yo female presents to clinic for 6 week post-partum visit.  Patient has no complaints.  Denies any abnormal uterine bleeding, abdominal/pelvic pain.  Denies any loss of interest/pleasure in activities or depressed mood.  She states that she is getting enough sleep and has a lot of support from FOB and family.  Would like to start taking OCP for [facility].    Review of Systems Per HPI    Objective:   Physical Exam  Constitutional: She appears well-developed and well-nourished.  HENT:  Head: Normocephalic.  Cardiovascular: Normal rate and regular rhythm.  Exam reveals no gallop and no friction rub.   No murmur heard. Pulmonary/Chest: Effort normal. She has no wheezes. She has no rales.  Abdominal: Soft. Bowel sounds are normal. She exhibits no distension. There is no tenderness. There is no rebound and no guarding.  Genitourinary: Vagina normal and uterus normal. Cervix exhibits discharge. Cervix exhibits no motion tenderness and no friability. Right adnexum displays no mass, no tenderness and no fullness. Left adnexum displays no mass, no tenderness and no fullness.       2nd degree laceration repair - healing well.  No erythema, tenderness, active bleeding or drainage.  Skin: Skin is warm. No rash noted.  Psychiatric: She has a normal mood and affect. Her behavior is normal.          Assessment & Plan:   Contraceptive management Patient interested in taking OCPs. Will send generic Y[facility] to [facility]. If patient desires to switch to Nexplanon or Depo, advised her to return to clinic.   Pregnancy, first Post-partum visit - no concerns. No bleeding, pain, or constipation. PHQ 2 - passed; no evidence of depression. Formula feeding and desires to take OCP for birth control. Patient to return clinic in 1 year for annual adult physical.

## 2011-04-19 ENCOUNTER — Telehealth: Payer: Self-pay | Admitting: Family Medicine

## 2011-04-19 NOTE — Telephone Encounter (Signed)
Patient dropped off FMLA forms to be filled out.  Please call her when completed. °

## 2011-04-20 NOTE — Telephone Encounter (Signed)
Paperwork placed in Dr. Tommi Rumps la Cruz's box.

## 2011-04-24 NOTE — Telephone Encounter (Signed)
LVM - told patient that I did not fill out the dates of leave.  She can fill out that part.  Otherwise, I told her it was ready to be picked up.  I gave the form to [employer] today.

## 2011-06-22 NOTE — Progress Notes (Signed)
This encounter was created in error - please disregard.

## 2011-06-27 LAB — POCT RAPID STREP A: Streptococcus, Group A Screen (Direct): NEGATIVE

## 2011-10-03 ENCOUNTER — Telehealth: Payer: Self-pay | Admitting: Family Medicine

## 2011-10-03 ENCOUNTER — Ambulatory Visit (HOSPITAL_COMMUNITY)
Admission: RE | Admit: 2011-10-03 | Discharge: 2011-10-03 | Disposition: A | Discharge status: Home / Self Care | Payer: Self-pay | Source: Ambulatory Visit | Attending: Family Medicine | Admitting: Family Medicine

## 2011-10-03 ENCOUNTER — Encounter: Payer: Self-pay | Admitting: Family Medicine

## 2011-10-03 ENCOUNTER — Ambulatory Visit (INDEPENDENT_AMBULATORY_CARE_PROVIDER_SITE_OTHER): Payer: Self-pay | Admitting: Family Medicine

## 2011-10-03 VITALS — BP 98/70 | HR 96 | Temp 98.6°F | Ht 62.0 in | Wt 115.0 lb

## 2011-10-03 DIAGNOSIS — R55 Syncope and collapse: Secondary | ICD-10-CM | POA: Insufficient documentation

## 2011-10-03 DIAGNOSIS — R8789 Other abnormal findings in specimens from female genital organs: Secondary | ICD-10-CM

## 2011-10-03 DIAGNOSIS — Z789 Other specified health status: Secondary | ICD-10-CM

## 2011-10-03 DIAGNOSIS — Z309 Encounter for contraceptive management, unspecified: Secondary | ICD-10-CM

## 2011-10-03 DIAGNOSIS — J029 Acute pharyngitis, unspecified: Secondary | ICD-10-CM

## 2011-10-03 MED ORDER — NORGESTIM-ETH ESTRAD TRIPHASIC 0.18/0.215/0.25 MG-35 MCG PO TABS: 1.0000 | ORAL_TABLET | Freq: Every day | ORAL | Status: DC

## 2011-10-03 NOTE — Telephone Encounter (Signed)
Wants to know about tests that were done this AM - she is worried something is wrong.

## 2011-10-03 NOTE — Assessment & Plan Note (Signed)
Overdue for 1 year follow-up.  Did not get Pap at postpartum visit.  Advised to make return appt for pap smear.

## 2011-10-03 NOTE — Assessment & Plan Note (Signed)
Will rule out strep and mono.  Not likley related to syncope

## 2011-10-03 NOTE — Patient Instructions (Signed)
Take a prenatal vitamin daily  New birth control- tri-sprintec $9 1 month, $24 3 months at [facility]  Please let me know if this does not work out for you.  Try to give it at least a 3 month trial  I will send you a letter with normal lab results, will call you with abnormal.  If you don't hear anything within 2 weeks, please give our office a call  Make follow-up with your primary doctor for pap smear to follow-up colposcopy on 11/25/09

## 2011-10-03 NOTE — Assessment & Plan Note (Signed)
Could not afford yaz.  Does not desire pregnancy.  currenlty uninsured. Discussed option, would like OCP.  Will rx tri sprintec.

## 2011-10-03 NOTE — Progress Notes (Signed)
  Subjective:    Patient ID: Alejandra Conway, female    DOB: [birth year], 26 y.o.   MRN: [id number]  HPI  Work in appt for syncopal episode  Last night while in shower with boyfriend, blacked out and boyfriend caught her.  Thinks she may have hit left side head on side of shower.  No prodrome.  Has been at her baseline health that day.  No seizure like activity.  Immediately regained consciousness.  Has been mildly dizzy since then.  Has had history of mild dizziness over the past few months, several episodes, short in duration, sometimes triggered by turning her head.    Has history of heart palpitations, occassionally has "heart flutters"  Has been worked up with 48 hour Holter monitor years ago that was normal.  Mom has history of arrythmia causing syncope.  Able to exercise on elliptical with fatigue, but no palpitations or presyncope.  Not taking any medications except for occasional pnv.  Sore throat:  Has had sore throat since syncopal episode yesterday. Overall feeling very fatigued chronically  I have reviewed patient's  PMH, FH, and Social history and Medications as related to this visit.  Review of Systems    Gen:  No fever, chills, unexplained weight loss Nose:  No rhinorrhea, congestion Throat:  No sore throat or dysphagia CV:  No chest pain,  PND, dyspnea on exertion, or edema Resp: No cough, dyspnea, wheezing Abd: No nausea, vomting, diarrhea, constipatio Neuro:  No headache, numbness, weakness, tingling   Objective:   Physical Exam GEN: Alert & Oriented, No acute distress Throat:  Posterior oropharynx erythematous, mild tonsillar edema, no pus CV:  Regular Rate & Rhythm, no murmur Respiratory:  Normal work of breathing, CTAB Abd:  + BS, soft, no tenderness to palpation Ext: no pre-tibial edema Neuro: Normal gait, EOMI, PERRLA.   EKG: Rate 91, NSR Orthostatic BP: wnl       Assessment & Plan:

## 2011-10-03 NOTE — Assessment & Plan Note (Signed)
This episode appears to be vasovagal.  No evidence of cardiac or neuro etiology.  No orthostasis.  EKG wnl, previous 48 hour holter monitor normal.  Will check CMET, CBC, TSH today.  Advised to continue to monitor, return for continued events.

## 2011-10-03 NOTE — Assessment & Plan Note (Signed)
Patient states got MMR postpartum

## 2011-10-04 ENCOUNTER — Telehealth: Payer: Self-pay | Admitting: Family Medicine

## 2011-10-04 ENCOUNTER — Other Ambulatory Visit: Payer: Self-pay | Admitting: *Deleted

## 2011-10-04 DIAGNOSIS — R002 Palpitations: Secondary | ICD-10-CM

## 2011-10-04 LAB — CBC
MCHC: 32.3 g/dL (ref 30.0–36.0)
RDW: 14.4 % (ref 11.5–15.5)
WBC: 11 10*3/uL — ABNORMAL HIGH (ref 4.0–10.5)

## 2011-10-04 LAB — TSH: TSH: 0.945 u[IU]/mL (ref 0.350–4.500)

## 2011-10-04 LAB — COMPREHENSIVE METABOLIC PANEL
ALT: 9 U/L (ref 0–35)
AST: 13 U/L (ref 0–37)
Alkaline Phosphatase: 64 U/L (ref 39–117)
Potassium: 4.1 mEq/L (ref 3.5–5.3)
Sodium: 138 mEq/L (ref 135–145)
Total Bilirubin: 0.4 mg/dL (ref 0.3–1.2)
Total Protein: 7.5 g/dL (ref 6.0–8.3)

## 2011-10-04 NOTE — Telephone Encounter (Signed)
Left message all labs normal

## 2011-10-04 NOTE — Telephone Encounter (Signed)
Left message, all labs normal.

## 2011-10-05 ENCOUNTER — Telehealth: Payer: Self-pay

## 2011-10-24 ENCOUNTER — Telehealth: Payer: Self-pay | Admitting: *Deleted

## 2011-10-24 NOTE — Telephone Encounter (Signed)
I left a message for the patient to call regarding having her heart monitor placed. Message left on her Cell # (same as home). I also left a message on her mother's voice mail Babette Relic Odell- [phone]) cell # (same as home) to please call so we can get the monitor scheduled for her. Tammy requested the appointment for her daughter at her last office visit with Dr. Graciela Husbands. I explained on each message we will need to reschedule her appointment with Dr. Graciela Husbands on 11/14/11 if she does not have her monitor placed soon.

## 2011-11-10 ENCOUNTER — Telehealth: Payer: Self-pay | Admitting: *Deleted

## 2011-11-10 NOTE — Telephone Encounter (Signed)
I attempted to call the patient and her mother, Reed Pandy, to see if the patient was going to wear her event monitor as requested by Dr. Graciela Husbands prior to an office visit.

## 2011-11-14 ENCOUNTER — Institutional Professional Consult (permissible substitution): Payer: Self-pay | Admitting: Internal Medicine

## 2011-11-24 ENCOUNTER — Encounter: Payer: Self-pay | Admitting: Internal Medicine

## 2012-01-03 NOTE — Telephone Encounter (Signed)
ncb 

## 2012-03-01 IMAGING — US US OB COMP LESS 14 WK
1 series · 14 of 28 positions shown · non-contrast
Comparison: none

OBSTETRICAL ULTRASOUND:
 This ultrasound exam was performed in the [HOSPITAL] Ultrasound Department.  The OB US report was generated in the AS system, and faxed to the ordering physician.  This report is also available in [HOSPITAL]?s [employer] and in [REDACTED] PACS.

[Series 1: us ob comp less 14 wks · 0.20mm/px · 14 of 83 slices shown]
[im 4/83]
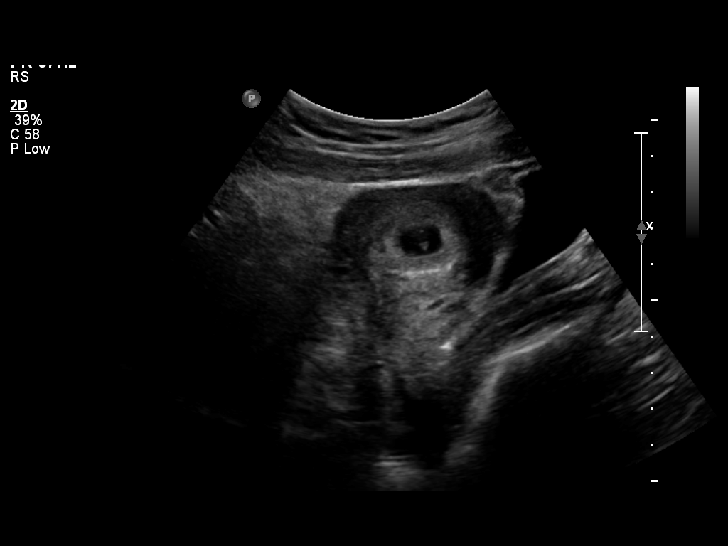
[im 10/83]
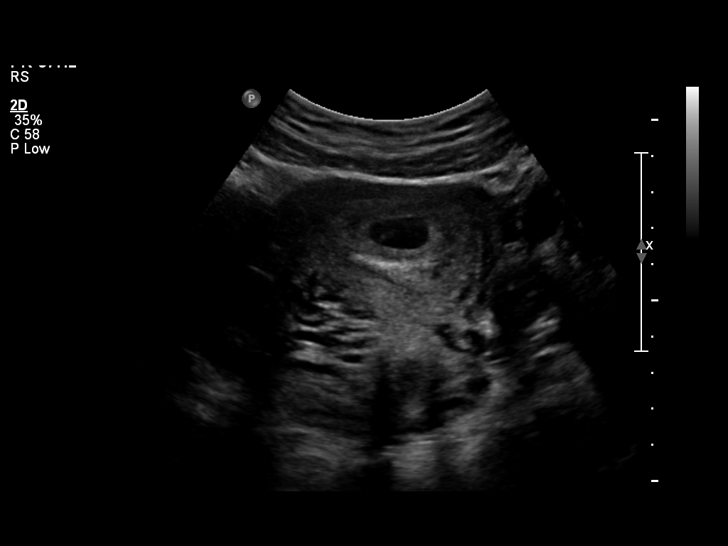
[im 16/83]
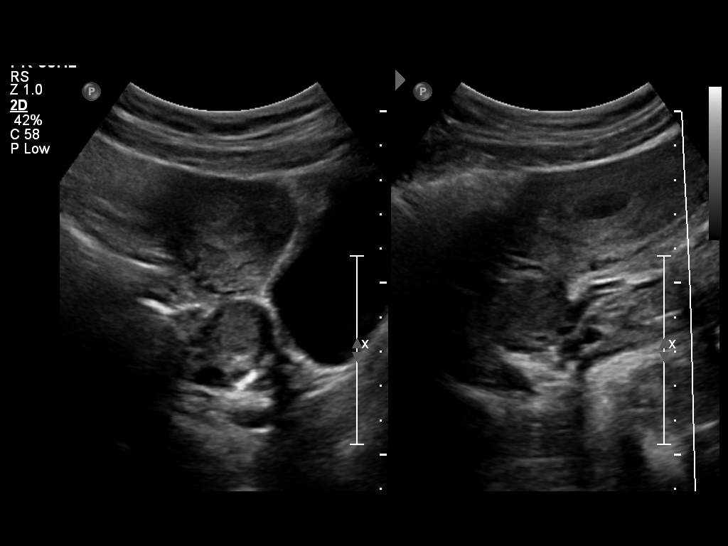
[im 22/83]
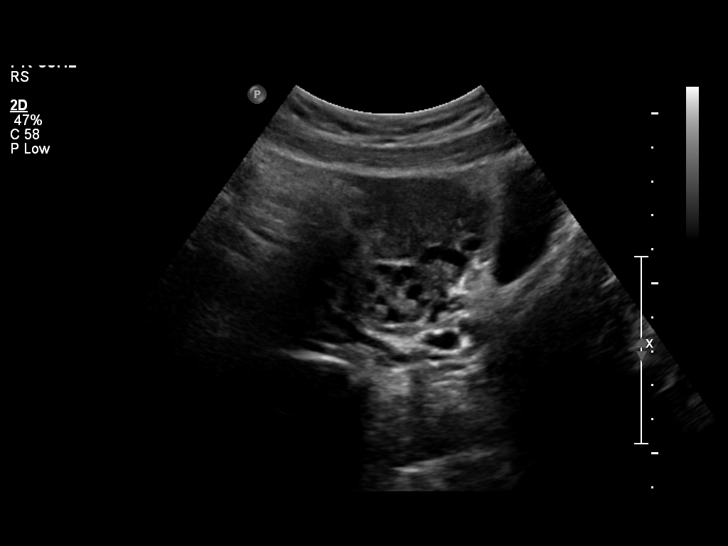
[im 28/83]
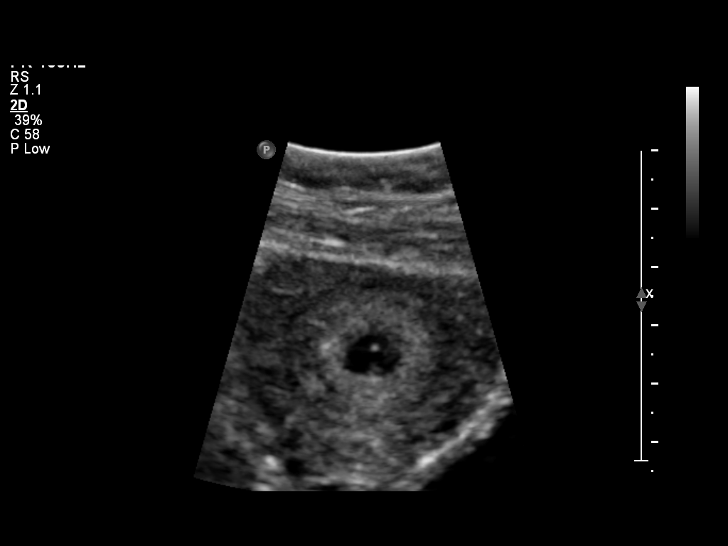
[im 34/83]
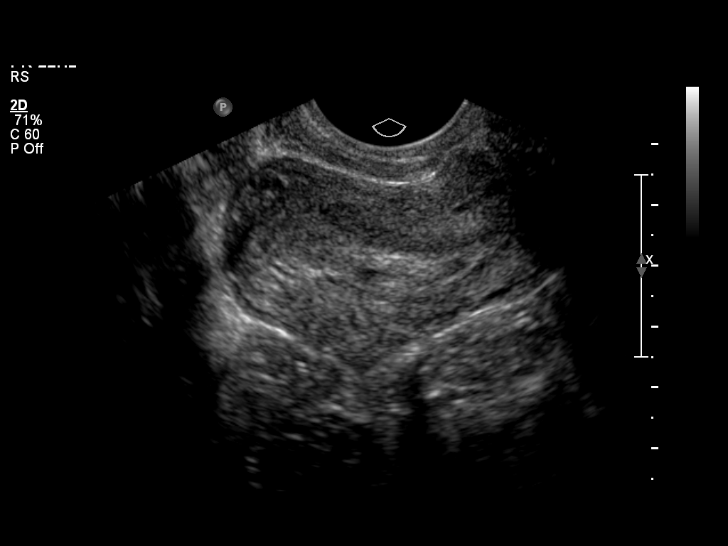
[im 40/83]
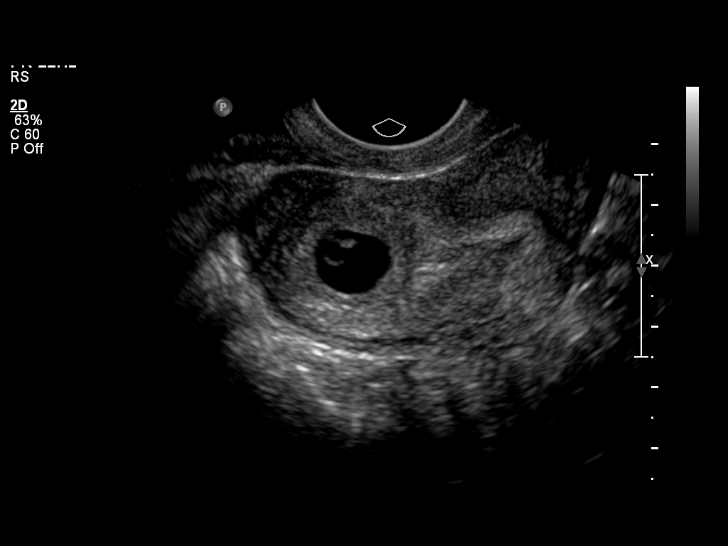
[im 46/83]
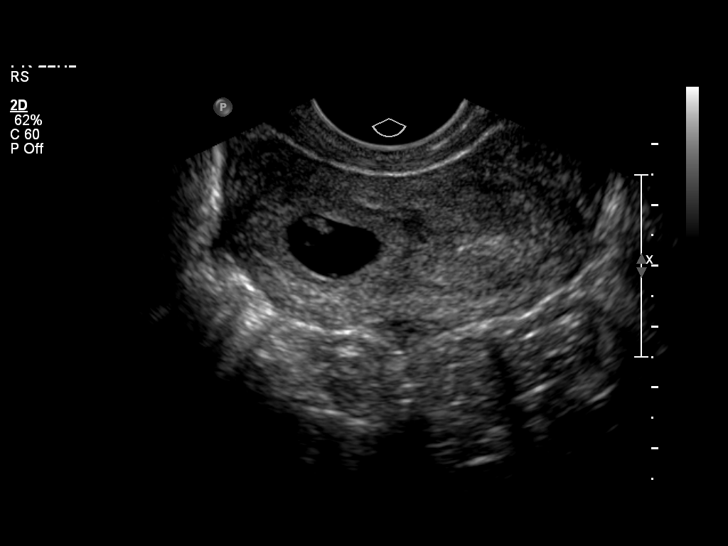
[im 52/83]
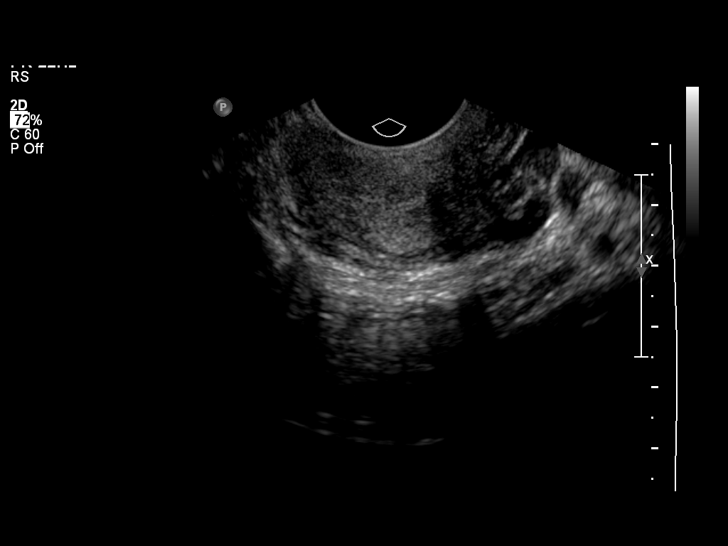
[im 58/83]
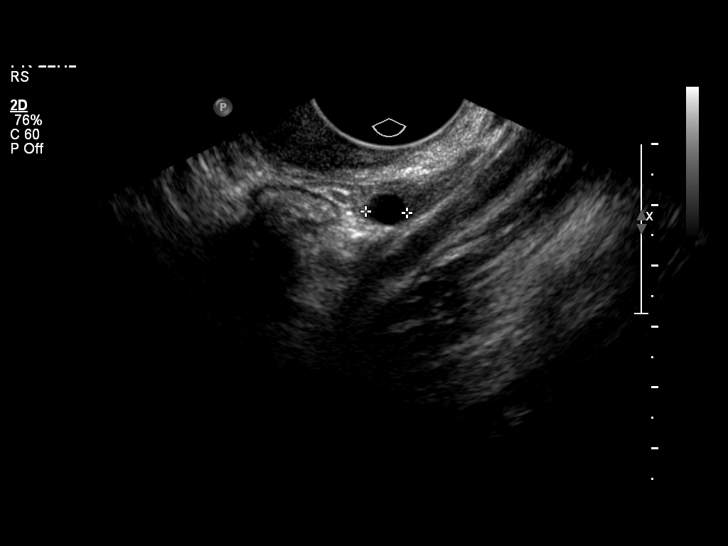
[im 64/83]
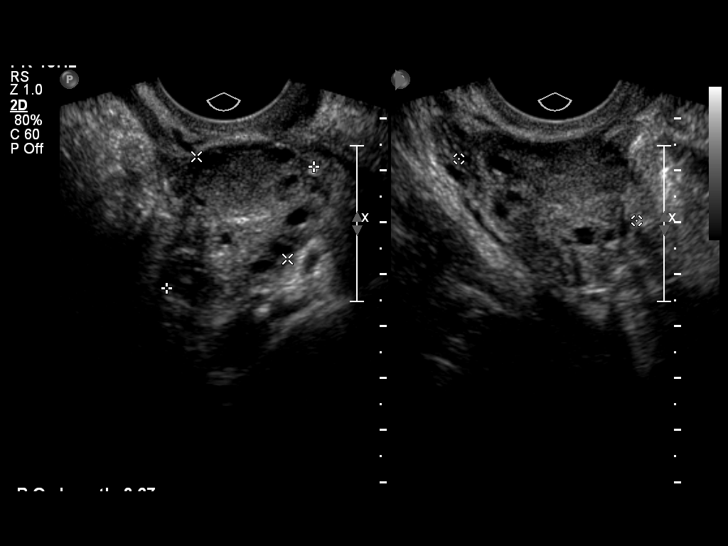
[im 70/83]
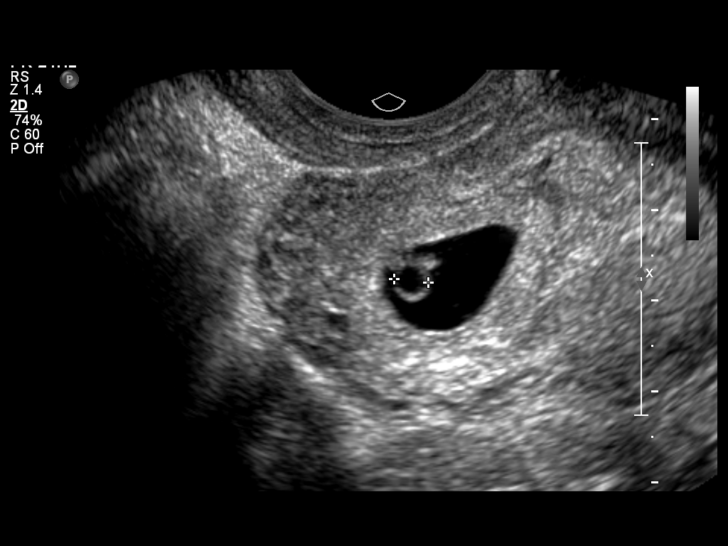
[im 76/83]
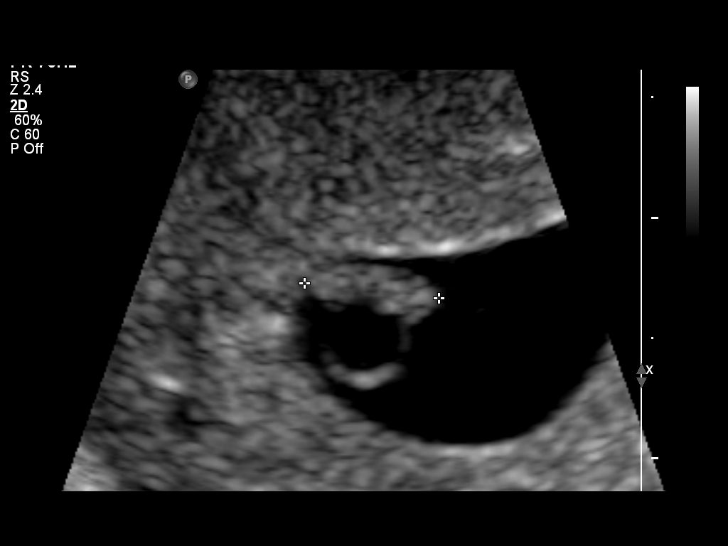
[im 83/83]
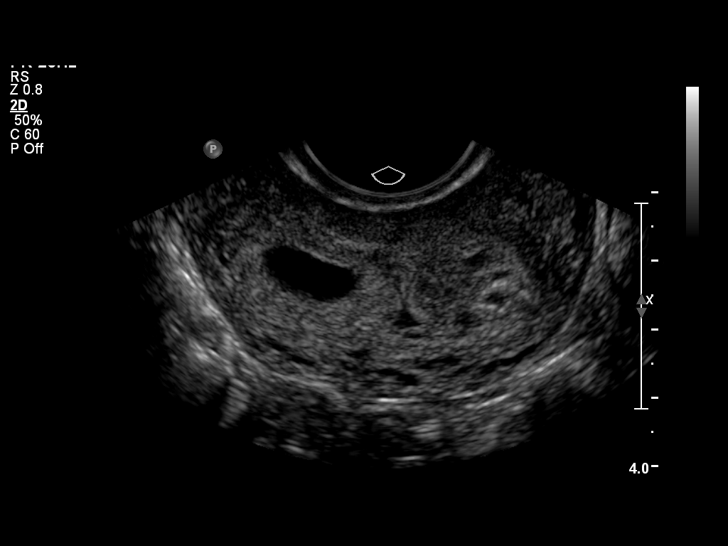

[14 of 28 positions shown; findings below may reference images not displayed]

IMPRESSION: See AS Obstetric US report.

## 2012-09-11 HISTORY — PX: WISDOM TOOTH EXTRACTION: SHX21

## 2012-09-26 ENCOUNTER — Encounter: Payer: Self-pay | Admitting: Family Medicine

## 2012-10-04 ENCOUNTER — Ambulatory Visit (INDEPENDENT_AMBULATORY_CARE_PROVIDER_SITE_OTHER): Payer: Self-pay | Admitting: Family Medicine

## 2012-10-04 ENCOUNTER — Encounter: Payer: Self-pay | Admitting: Family Medicine

## 2012-10-04 VITALS — BP 120/74 | HR 81 | Temp 97.9°F | Wt 119.8 lb

## 2012-10-04 DIAGNOSIS — M545 Low back pain: Secondary | ICD-10-CM

## 2012-10-04 MED ORDER — NORGESTIMATE-ETH ESTRADIOL 0.25-35 MG-MCG PO TABS: 1.0000 | ORAL_TABLET | Freq: Every day | ORAL | Status: DC

## 2012-10-04 MED ORDER — NORGESTIM-ETH ESTRAD TRIPHASIC 0.18/0.215/0.25 MG-35 MCG PO TABS: 1.0000 | ORAL_TABLET | Freq: Every day | ORAL | Status: DC

## 2012-10-04 MED ORDER — ALBUTEROL SULFATE HFA 108 (90 BASE) MCG/ACT IN AERS: 2.0000 | INHALATION_SPRAY | Freq: Four times a day (QID) | RESPIRATORY_TRACT | Status: DC | PRN

## 2012-10-04 MED ORDER — IBUPROFEN 600 MG PO TABS
600.0000 mg | ORAL_TABLET | Freq: Three times a day (TID) | ORAL | Status: DC | PRN
Start: 2012-10-04 — End: 2013-07-18

## 2012-10-04 MED ORDER — CYCLOBENZAPRINE HCL 5 MG PO TABS: 5.0000 mg | ORAL_TABLET | Freq: Every evening | ORAL | Status: DC | PRN

## 2012-10-04 NOTE — Patient Instructions (Addendum)
It was good to see you again, [place]. Please pick up four prescriptions at your pharmacy. For low back pain, continue to perform back stretches and take medication x 2 weeks. If no improvement after 2 weeks, please return to clinic. If you develop worsening pain not relieved by mediation, numbness or tingling of legs, difficulty walking, or difficulty holding urine or bowel, please report to ER.  Herniated Disk The bones of your spinal column (vertebrae) protect your spinal cord and nerves that go into your arms and legs. The vertebrae are separated by disks that cushion the spinal column and put space between your vertebrae. This allows movement between the vertebrae, which allows you to bend, rotate, and move your body from side to side. Sometimes, the disks move out of place (herniate) or break open (rupture) from injury or strain. The most common area for a disk herniation is in the lower back (lumbar area). Sometimes herniation occurs in the neck (cervical) disks.  CAUSES  As we grow older, the strong, fibrous cords that connect the vertebrae and support and surround the disks (ligaments) start to weaken. A strain on the back may cause a break in the disk ligaments. RISK FACTORS Herniated disks occur most often in men who are aged 18 years to 35 years, usually after strenuous activity. Other risk factors include conditions present at birth (congenital) that affect the size of the lumbar spinal canal. Additionally, a narrowing of the areas where the nerves exit the spinal canal can occur as you age. SYMPTOMS  Symptoms of a herniated disk vary. You may have weakness in certain muscles. This weakness can include difficulty lifting your leg or arm, difficulty standing on your toes on one side, or difficulty squeezing tightly with one of your hands. You may have numbness. You may feel a mild tingling, dull ache, or a burning or pulsating pain. In some cases, the pain is severe enough that you are  unable to move. The pain most often occurs on one side of the body. The pain often starts slowly. It may get worse:  After you sit or stand.  At night.  When you sneeze, cough, or laugh.  When you bend backwards or walk more than a few yards. The pain, numbness, or weakness will often go away or improve a lot over a period of weeks to months. Herniated lumbar disk Symptoms of a herniated lumbar disk may include sharp pain in one part of your leg, hip, or buttocks and numbness in other parts. You also may feel pain or numbness on the back of your calf or the top or sole of your foot. The same leg also may feel weak. Herniated cervical disk Symptoms of a herniated cervical disk may include pain when you move your neck, deep pain near or over your shoulder blade, or pain that moves to your upper arm, forearm, or fingers. DIAGNOSIS  To diagnose a herniated disk, your caregiver will perform a physical exam. Your caregiver also may perform diagnostic tests to see your disk or to test the reaction of your muscles and the function of your nerves. During the physical exam, your caregiver may ask you to:  Sit, stand, and walk. While you walk, your caregiver may ask you to try walking on your toes and then your heels.  Bend forward, backward, and sideways.  Raise your shoulders, elbow, wrist, and fingers and check your strength during these tasks. Your caregiver will check for:  Numbness or loss of feeling.  Muscle reflexes, which may be slower or missing.  Muscle strength, which may be weaker.  Posture or the way your spine curves. Diagnostic tests that may be done include:  A spinal X-ray exam to rule out other causes of back pain.  Magnetic resonance imaging (MRI) or computed tomography (CT) scan, which will show if the herniated disk is pressing on your spinal canal.  Electromyography. This is sometimes used to identify the specific area of nerve involvement. TREATMENT  Initial  treatment for a herniated disk is a short period of rest with medicines for pain. Pain medicines can include nonsteroidal anti-inflammatory medicines (NSAIDs), muscle relaxants for back spasms, and (rarely) narcotic pain medicine for severe pain that does not respond to NSAID use. Bed rest is often limited to 1 or 2 days at the most because prolonged rest can delay recovery. When the herniation involves the lower back, sitting should be avoided as much as possible because sitting increases pressure on the ruptured disk. Sometimes a soft neck collar will be prescribed for a few days to weeks to help support your neck in the case of a cervical herniation. Physical therapy is often prescribed for patients with disk disease. Physical therapists will teach you how to properly lift, dress, walk, and perform other activities. They will work on strengthening the muscles that help support your spine. In some cases, physical therapy alone is not enough to treat a herniated disk. Steroid injections along the involved nerve root may be needed to help control pain. The steroid is injected in the area of the herniated disk and helps by reducing swelling around the disk. Sometimes surgery is the best option to treat a herniated disk.  SEEK IMMEDIATE MEDICAL CARE IF:   You have numbness, tingling, weakness, or problems with the use of your arms or legs.  You have severe headaches that are not relieved with the use of medicines.  You notice a change in your bowel or bladder control.  You have increasing pain in any areas of your body.  You experience shortness of breath, dizziness, or fainting. MAKE SURE YOU:   Understand these instructions.  Will watch your condition.  Will get help right away if you are not doing well or get worse. Document Released: 08/25/2000 Document Revised: 11/20/2011 Document Reviewed: 03/31/2011 [facility] Patient Information 2013 [place], [place].

## 2012-10-04 NOTE — Progress Notes (Signed)
  Subjective:    Patient ID: Alejandra Conway, female    DOB: 06-20-1986, 27 y.o.   MRN: [id number]  HPI  Patient presents to same day clinic for low back pain.  Patient with a hx of bulging disc in high school.  In April 2013, low back pain started again and has been getting worse ever since.  Located RT side lumbar spine and radiates from side to side.  Patient has tried stretching that worked for one day, but pain keeps coming back.    Pain scale 6/10 today.  Pain is constant.  Last night, pain woke up her up from sleep and she was uncomfortable in all positisions.  Has not tried any over the counter analgesics, because she typically does not like taking medications.   Denies any numbness or tingling.  Denies any urinary retention or bowel incontinence.  Review of Systems Per HPI    Objective:   Physical Exam  Constitutional: She appears well-nourished. No distress.  Neurological: She is alert. No cranial nerve deficit. Coordination normal.  MSK: low back - no tissue texture changes, Full ROM in flexion and extension, normal hip rotation, unable to bend over and touch toes due to pain, no diminished skin sensation; no bony tenderness or paraspinal tenderness        Assessment & Plan:

## 2012-10-04 NOTE — Assessment & Plan Note (Signed)
Patient has a hx of bulging disc in childhood.  This appears to have flared up in the last few months.  No neuro deficits on exam. - Will try conservative treatment with Flexeril 5 mg QHS PRN and Motrin 600 PRN - If no improvement with rest, heating pads, and medications in 2 weeks, patient to return to clinic for further work up - Red flags reviewed with patient and per handout

## 2013-04-16 ENCOUNTER — Other Ambulatory Visit (HOSPITAL_COMMUNITY)
Admission: RE | Admit: 2013-04-16 | Discharge: 2013-04-16 | Disposition: A | Discharge status: Home / Self Care | Payer: Medicaid Other | Source: Ambulatory Visit | Attending: Family Medicine | Admitting: Family Medicine

## 2013-04-16 ENCOUNTER — Ambulatory Visit (INDEPENDENT_AMBULATORY_CARE_PROVIDER_SITE_OTHER): Payer: Medicaid Other | Admitting: Family Medicine

## 2013-04-16 ENCOUNTER — Ambulatory Visit (HOSPITAL_COMMUNITY)
Admission: RE | Admit: 2013-04-16 | Discharge: 2013-04-16 | Disposition: A | Discharge status: Home / Self Care | Payer: Medicaid Other | Source: Ambulatory Visit | Attending: Family Medicine | Admitting: Family Medicine

## 2013-04-16 ENCOUNTER — Encounter: Payer: Self-pay | Admitting: Family Medicine

## 2013-04-16 VITALS — BP 119/81 | HR 116 | Ht 62.0 in | Wt 118.0 lb

## 2013-04-16 DIAGNOSIS — M545 Low back pain, unspecified: Secondary | ICD-10-CM | POA: Insufficient documentation

## 2013-04-16 DIAGNOSIS — M79609 Pain in unspecified limb: Secondary | ICD-10-CM | POA: Insufficient documentation

## 2013-04-16 DIAGNOSIS — M549 Dorsalgia, unspecified: Secondary | ICD-10-CM | POA: Insufficient documentation

## 2013-04-16 DIAGNOSIS — Z113 Encounter for screening for infections with a predominantly sexual mode of transmission: Secondary | ICD-10-CM | POA: Insufficient documentation

## 2013-04-16 DIAGNOSIS — Z01419 Encounter for gynecological examination (general) (routine) without abnormal findings: Secondary | ICD-10-CM | POA: Insufficient documentation

## 2013-04-16 DIAGNOSIS — R8789 Other abnormal findings in specimens from female genital organs: Secondary | ICD-10-CM

## 2013-04-16 DIAGNOSIS — Z124 Encounter for screening for malignant neoplasm of cervix: Secondary | ICD-10-CM

## 2013-04-16 DIAGNOSIS — R002 Palpitations: Secondary | ICD-10-CM

## 2013-04-16 HISTORY — DX: Low back pain, unspecified: M54.50

## 2013-04-16 LAB — CBC WITH DIFFERENTIAL/PLATELET
Basophils Relative: 0 % (ref 0–1)
Eosinophils Absolute: 0.2 10*3/uL (ref 0.0–0.7)
Eosinophils Relative: 2 % (ref 0–5)
HCT: 38 % (ref 36.0–46.0)
Hemoglobin: 13 g/dL (ref 12.0–15.0)
MCH: 29.4 pg (ref 26.0–34.0)
MCHC: 34.2 g/dL (ref 30.0–36.0)
MCV: 86 fL (ref 78.0–100.0)
Monocytes Absolute: 0.4 10*3/uL (ref 0.1–1.0)
Monocytes Relative: 5 % (ref 3–12)
Neutro Abs: 4.8 10*3/uL (ref 1.7–7.7)

## 2013-04-16 LAB — BASIC METABOLIC PANEL
BUN: 13 mg/dL (ref 6–23)
Glucose, Bld: 83 mg/dL (ref 70–99)
Potassium: 4.3 mEq/L (ref 3.5–5.3)

## 2013-04-16 LAB — TSH: TSH: 1.058 u[IU]/mL (ref 0.350–4.500)

## 2013-04-16 MED ORDER — MELOXICAM 7.5 MG PO TABS: 7.5000 mg | ORAL_TABLET | Freq: Every day | ORAL | Status: DC

## 2013-04-16 NOTE — Progress Notes (Signed)
Subjective:     Patient ID: Alejandra Conway, female   DOB: [birth year], 27 y.o.   MRN: [id number]  HPI Women's health: G2P1011, 27 y.o. female. Here for PAP  today. Patient has an abnormal PAP in 2011 LGSIL. She reports she has colpo after and told it was ok. 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 Her last LMP was 8/1 and she reports she is mildly still bleeding. Her periods are normal and she is on BCP. She is monogamous for past few years with same partner. She desires STD check today.   Back pain: She reports mild chronic pain since high school. She was active in sports and cheerleading. She had an imagining study at some point in her highschool years and was told she had a mild bulging disc. She has not had further evaluation of pain since, but has required ibuprofen and flexeril in the past for pain. She reports the ibp does not help and the flexeril required >1 dose to help with pain, but then she felt fatigue the next day. She reports pain that radiates down her right leg on occasions. Difficulty laying flat without pain. Sometimes she feels the pain across her "lower" back, she points to her lumbar/sacral joint on the right. She inquires about restless leg syndrome, she reports she feels like she needs to move her legs, especially at night. She states it has become worse over the past two weeks.   Tachycardia: She is mildly tachycardic today. She reports she feels her heart race frequently and has "palpitations". She has worn an event monitor in the past but it did not catch any events, although she states she felt her symptoms while wearing it. She denies excessive caffeine, or energy drinks and is a non-smoker. She has had a reported syncopal episode 09/2011 with full workup mentioned above that was normal. Her sister has similar symptoms and her Mother is also with symptoms. Her Mother recently wrecked her car from syncopal episode and had to get a pacemaker placed.    Patient's past medical, social, and family history were reviewed and updated as appropriate.  Review of Systems Negative, with the exception of above mentioned in HPI     Objective:   Physical Exam BP 119/81  Pulse 116  Ht 5\' 2"  (1.575 m)  Wt 118 lb (53.524 kg)  BMI 21.58 kg/m2 Gen: Pleasant female.  CV: Regular rhythm, tachycardic, no murmur appreciated Chest: CTAB Abd: Soft. NTND. BS positive GYN:  External genitalia within normal limits.  Vaginal mucosa pink, moist, normal rugae.  Nonfriable cervix without lesions, no discharge or (mild) bleeding noted on speculum exam.  Bimanual exam revealed normal, nongravid uterus.  No cervical motion tenderness. No adnexal masses bilaterally.   MSK: back without erythema, swelling or tissue changes. Not TTP. Mild stretch/ discomfort with flexion. No radiation to legs. No numbness or tingling on todays exam. Pain with laying flat. No discomfort with extension, rotation or side bending.

## 2013-04-16 NOTE — Patient Instructions (Signed)
It was a pleasure meeting you today. We will call you with the results when they become available of your PAP, blood work and Veterinary surgeon. I would like you to document your heart rate over the next few weeks and follow up with me in 3-weeks. Drink plenty of water daily.  Palpitations  A palpitation is the feeling that your heartbeat is irregular or is faster than normal. It may feel like your heart is fluttering or skipping a beat. Palpitations are usually not a serious problem. However, in some cases, you may need further medical evaluation. CAUSES  Palpitations can be caused by:  Smoking.  Caffeine or other stimulants, such as diet pills or energy drinks.  Alcohol.  Stress and anxiety.  Strenuous physical activity.  Fatigue.  Certain medicines.  Heart disease, especially if you have a history of arrhythmias. This includes atrial fibrillation, atrial flutter, or supraventricular tachycardia.  An improperly working pacemaker or defibrillator. DIAGNOSIS  To find the cause of your palpitations, your caregiver will take your history and perform a physical exam. Tests may also be done, including:  Electrocardiography (ECG). This test records the heart's electrical activity.  Cardiac monitoring. This allows your caregiver to monitor your heart rate and rhythm in real time.  Holter monitor. This is a portable device that records your heartbeat and can help diagnose heart arrhythmias. It allows your caregiver to track your heart activity for several days, if needed.  Stress tests by exercise or by giving medicine that makes the heart beat faster. TREATMENT  Treatment of palpitations depends on the cause of your symptoms and can vary greatly. Most cases of palpitations do not require any treatment other than time, relaxation, and monitoring your symptoms. Other causes, such as atrial fibrillation, atrial flutter, or supraventricular tachycardia, usually require further treatment. HOME CARE  INSTRUCTIONS   Avoid:  Caffeinated coffee, tea, soft drinks, diet pills, and energy drinks.  Chocolate.  Alcohol.  Stop smoking if you smoke.  Reduce your stress and anxiety. Things that can help you relax include:  A method that measures bodily functions so you can learn to control them (biofeedback).  Yoga.  Meditation.  Physical activity such as swimming, jogging, or walking.  Get plenty of rest and sleep. SEEK MEDICAL CARE IF:   You continue to have a fast or irregular heartbeat beyond 24 hours.  Your palpitations occur more often. SEEK IMMEDIATE MEDICAL CARE IF:  You develop chest pain or shortness of breath.  You have a severe headache.  You feel dizzy, or you faint. MAKE SURE YOU:  Understand these instructions.  Will watch your condition.  Will get help right away if you are not doing well or get worse. Document Released: 08/25/2000 Document Revised: 02/27/2012 Document Reviewed: 10/27/2011 [facility] Patient Information 2014 [place], [place].

## 2013-04-17 ENCOUNTER — Encounter: Payer: Self-pay | Admitting: Family Medicine

## 2013-04-18 ENCOUNTER — Encounter: Payer: Self-pay | Admitting: Family Medicine

## 2013-04-20 NOTE — Assessment & Plan Note (Signed)
-   Appears to be chronic pain, that has slowly progressed over the years.  - Mobic prescribed - Lumbar spine xray today. --> Normal - F/U: as needed. May need MRI if pain increases to study nerves/disc

## 2013-04-20 NOTE — Assessment & Plan Note (Signed)
-   Patient with a history of palpitations and heart racing. I have asked her to make an appointment to have this evaluated fully here.  - Would like to obtain EKG, possibly ECHO, 24 hour holter monitor and consider cardio consult. She reports a work up in the past that was negative, but with family history I  Would like to look further into issue.

## 2013-04-20 NOTE — Assessment & Plan Note (Signed)
-   History of abnormal PAP.  - PAP today was normal. - G/C cultures also normal.  - Repeat 1 year

## 2013-07-18 ENCOUNTER — Encounter: Payer: Self-pay | Admitting: Family Medicine

## 2013-07-18 ENCOUNTER — Ambulatory Visit (INDEPENDENT_AMBULATORY_CARE_PROVIDER_SITE_OTHER): Payer: Medicaid Other | Admitting: Family Medicine

## 2013-07-18 VITALS — BP 122/86 | HR 83 | Temp 98.1°F | Ht 62.0 in | Wt 125.0 lb

## 2013-07-18 DIAGNOSIS — R002 Palpitations: Secondary | ICD-10-CM

## 2013-07-18 DIAGNOSIS — M545 Low back pain: Secondary | ICD-10-CM

## 2013-07-18 MED ORDER — CYCLOBENZAPRINE HCL 10 MG PO TABS: 10.0000 mg | ORAL_TABLET | Freq: Three times a day (TID) | ORAL | Status: DC | PRN

## 2013-07-18 MED ORDER — MELOXICAM 15 MG PO TABS: 15.0000 mg | ORAL_TABLET | Freq: Every day | ORAL | Status: DC

## 2013-07-18 NOTE — Patient Instructions (Signed)
Lumbosacral Strain Lumbosacral strain is one of the most common causes of back pain. There are many causes of back pain. Most are not serious conditions. CAUSES  Your backbone (spinal column) is made up of 24 main vertebral bodies, the sacrum, and the coccyx. These are held together by muscles and tough, fibrous tissue (ligaments). Nerve roots pass through the openings between the vertebrae. A sudden move or injury to the back may cause injury to, or pressure on, these nerves. This may result in localized back pain or pain movement (radiation) into the buttocks, down the leg, and into the foot. Sharp, shooting pain from the buttock down the back of the leg (sciatica) is frequently associated with a ruptured (herniated) disk. Pain may be caused by muscle spasm alone. Your caregiver can often find the cause of your pain by the details of your symptoms and an exam. In some cases, you may need tests (such as X-rays). Your caregiver will work with you to decide if any tests are needed based on your specific exam. HOME CARE INSTRUCTIONS   Avoid an underactive lifestyle. Active exercise, as directed by your caregiver, is your greatest weapon against back pain.  Avoid hard physical activities (tennis, racquetball, waterskiing) if you are not in proper physical condition for it. This may aggravate or create problems.  If you have a back problem, avoid sports requiring sudden body movements. Swimming and walking are generally safer activities.  Maintain good posture.  Avoid becoming overweight (obese).  Use bed rest for only the most extreme, sudden (acute) episode. Your caregiver will help you determine how much bed rest is necessary.  For acute conditions, you may put ice on the injured area.  Put ice in a plastic bag.  Place a towel between your skin and the bag.  Leave the ice on for 15-20 minutes at a time, every 2 hours, or as needed.  After you are improved and more active, it may help to  apply heat for 30 minutes before activities. See your caregiver if you are having pain that lasts longer than expected. Your caregiver can advise appropriate exercises or therapy if needed. With conditioning, most back problems can be avoided. SEEK IMMEDIATE MEDICAL CARE IF:   You have numbness, tingling, weakness, or problems with the use of your arms or legs.  You experience severe back pain not relieved with medicines.  There is a change in bowel or bladder control.  You have increasing pain in any area of the body, including your belly (abdomen).  You notice shortness of breath, dizziness, or feel faint.  You feel sick to your stomach (nauseous), are throwing up (vomiting), or become sweaty.  You notice discoloration of your toes or legs, or your feet get very cold.  Your back pain is getting worse.  You have a fever. MAKE SURE YOU:   Understand these instructions.  Will watch your condition.  Will get help right away if you are not doing well or get worse. Document Released: 06/07/2005 Document Revised: 11/20/2011 Document Reviewed: 11/27/2008 [name] Patient Information 2014 [place], [place].   I have referred you to the sports medicine clinic.They will call you with availability soon. I have also scheduled an echocardiogram for you.  I have re- prescribed the mobic at higher dose and refilled flexeril.  I will call you with results of echo and decide if me need to get a formal cardiology referral.

## 2013-07-18 NOTE — Progress Notes (Signed)
Subjective:     Patient ID: Alejandra Conway, female   DOB: May 10, 1986, 27 y.o.   MRN: [id number]  HPI  Back Pain: She reports mild chronic pain since high school. She was active in sports and cheerleading. She had an imagining study at some point in her highschool years and was told she had a mild bulging disc. She reports pain that radiates down her right leg on occasions. Difficulty laying flat without pain. Sometimes she feels the pain across her "lower" back, she points to her lumbar/sacral joint on the right. She takes Mobic for pain, that has only helped mildly. Flexeril makes her extremely sleepy.   Palpitations:  Patient with family history of cardiac complications causing need for pacer in mother. She states her sister also has frequent pre-syncope episodes. Patient has felt palpitations her entire life, however she has noticed them more over the past few months without the heart racing she complained of in prior examinations. 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 Her syncopal episodes  Review of Systems Negative, with the exception of above mentioned in HPI  Objective:   Physical Exam BP 122/86  Pulse 83  Temp(Src) 98.1 F (36.7 C) (Oral)  Ht 5\' 2"  (1.575 m)  Wt 125 lb (56.7 kg)  BMI 22.86 kg/m2  LMP 07/15/2013 Gen: NAD. Pleasant. Appears Tan (pt states she is going to tanning bed)  CV: RRR. No murmur appreciated Chest: CTAB, no wheeze or crackles Abd: Soft. NTND. BS Present. No Masses palpated.  Ext: No erythema. No edema. Bilateral pulses equal. Neuro: Normal gait.  Alert. Grossly intact.  MSK: Lumbar spine/tissue without tissue texture changes, Mild L3-5 NS(R)R(L). FABRE and straight leg raises normal. Mild discomfort local discomfort in lumbar spine with flexion and touching toes, no radiating pain. Pain with extension. TTP over right L5 tenderpoints (upper and lower pole)

## 2013-07-20 NOTE — Assessment & Plan Note (Signed)
Chronic back pain that has become gradually worse. Xray has been normal. Considering rather benign exam with normal xray, will hold on obtaining oblique films or MRI. I feel she would benefit from PT/strengthening and/or possible injections in the future.  - Sports medicine referral today - Mobic 15 and flexeril prescribed today

## 2013-07-20 NOTE — Assessment & Plan Note (Signed)
Considering family history and birth history with syncopal episodes and palpitations, will get echo today.

## 2013-08-10 ENCOUNTER — Emergency Department (HOSPITAL_COMMUNITY)
Admission: EM | Admit: 2013-08-10 | Discharge: 2013-08-10 | Disposition: A | Discharge status: Home / Self Care | Payer: Medicaid Other | Source: Home / Self Care

## 2013-08-10 ENCOUNTER — Encounter (HOSPITAL_COMMUNITY): Payer: Self-pay | Admitting: Emergency Medicine

## 2013-08-10 DIAGNOSIS — L909 Atrophic disorder of skin, unspecified: Secondary | ICD-10-CM

## 2013-08-10 DIAGNOSIS — L089 Local infection of the skin and subcutaneous tissue, unspecified: Secondary | ICD-10-CM

## 2013-08-10 DIAGNOSIS — L918 Other hypertrophic disorders of the skin: Secondary | ICD-10-CM

## 2013-08-10 NOTE — ED Provider Notes (Signed)
CSN: [id number]     Arrival date & time 08/10/13  1546 History   None    Chief Complaint  Patient presents with  . Cyst    1 month tingling/pain on upper back   (Consider location/radiation/quality/duration/timing/severity/associated sxs/prior Treatment) HPI Comments: Patient reports a "mole" to her left upper back for months. Last night it started to sting and burn and she was worried about cancer or infection. She is going to be seen by her PCP, but decided to let us take a look. She denies color change that she is aware of. No fever or chills and no drainage. No past medical history.   The history is provided by the patient.    Past Medical History  Diagnosis Date  . Abnormal Pap smear   . Asthma    History reviewed. No pertinent past surgical history. Family History  Problem Relation Age of Onset  . Heart disease Mother   . Alkaptonuria Son   . Heart disease Paternal Uncle   . Cancer Maternal Grandfather    History  Substance Use Topics  . Smoking status: Never Smoker   . Smokeless tobacco: Not on file  . Alcohol Use: No   OB History   Grav Para Term Preterm Abortions TAB SAB Ect Mult Living   2 0 0 0 1 1 0 0  0     Review of Systems  All other systems reviewed and are negative.    Allergies  Pseudoephedrine  Home Medications   Current Outpatient Rx  Name  Route  Sig  Dispense  Refill  . albuterol (PROVENTIL HFA;VENTOLIN HFA) 108 (90 BASE) MCG/ACT inhaler   Inhalation   Inhale 2 puffs into the lungs every 6 (six) hours as needed for shortness of breath (with exercise).   1 Inhaler   4   . cyclobenzaprine (FLEXERIL) 10 MG tablet   Oral   Take 1 tablet (10 mg total) by mouth 3 (three) times daily as needed for muscle spasms.   30 tablet   0   . meloxicam (MOBIC) 15 MG tablet   Oral   Take 1 tablet (15 mg total) by mouth daily.   30 tablet   0   . norgestimate-ethinyl estradiol (ORTHO-CYCLEN,SPRINTEC,PREVIFEM) 0.25-35 MG-MCG tablet   Oral   Take  1 tablet by mouth daily.   1 Package   11    BP 132/87  Pulse 89  Temp(Src) 97.8 F (36.6 C) (Oral)  Resp 15  SpO2 100%  LMP 06/17/2013 Physical Exam  Nursing note and vitals reviewed. Constitutional: She appears well-developed and well-nourished. No distress.  Skin: Skin is warm and dry. No rash noted. She is not diaphoretic. No erythema. No pallor.  Skin tag to left upper back, painful to touch or move. No erythema, no fluctuance, no mole noted.   Psychiatric: Her behavior is normal.    ED Course  Procedures (including critical care time) Labs Review Labs Reviewed - No data to display Imaging Review No results found.  EKG Interpretation    Date/Time:    Ventricular Rate:    PR Interval:    QRS Duration:   QT Interval:    QTC Calculation:   R Axis:     Text Interpretation:              MDM   1. Inflamed skin tag    Reassurance no infection. Not likely cancerous however only pathology would confirm this. F/U with PCP for removal as scheduled.  Riki Sheer, PA-C 08/10/13 1744

## 2013-08-10 NOTE — ED Notes (Signed)
1 month tingling/pain on upper back.  Stated it was painful

## 2013-08-10 NOTE — ED Provider Notes (Signed)
Medical screening examination/treatment/procedure(s) were performed by non-physician practitioner and as supervising physician I was immediately available for consultation/collaboration.  Siara Gorder, M.D.  Dashanae Longfield C Robert Sunga, MD 08/10/13 2029 

## 2013-08-11 ENCOUNTER — Encounter: Payer: Self-pay | Admitting: Family Medicine

## 2013-08-11 ENCOUNTER — Ambulatory Visit (INDEPENDENT_AMBULATORY_CARE_PROVIDER_SITE_OTHER): Payer: Medicaid Other | Admitting: Family Medicine

## 2013-08-11 VITALS — BP 122/65 | HR 79 | Temp 98.3°F | Resp 18 | Wt 125.0 lb

## 2013-08-11 DIAGNOSIS — R209 Unspecified disturbances of skin sensation: Secondary | ICD-10-CM

## 2013-08-11 DIAGNOSIS — R208 Other disturbances of skin sensation: Secondary | ICD-10-CM | POA: Insufficient documentation

## 2013-08-11 MED ORDER — LIDOCAINE 5 % EX PTCH: 1.0000 | MEDICATED_PATCH | CUTANEOUS | Status: DC

## 2013-08-11 NOTE — Assessment & Plan Note (Signed)
Discussed with attendings Eniola and McDiarmid. It seems no relationship her symptoms with nevi present. It does locate in an area that resembles herpes zoster distribution but no skin lesions are present, so diagnosis of Shingles is premature at this point. Recommended topical analgesic and close f/u.

## 2013-08-11 NOTE — Progress Notes (Signed)
Family Medicine Office Visit Note   Subjective:   Patient ID: Alejandra Conway, female  DOB: 1985-10-03, 27 y.o.. MRN: [id number]   Pt that comes today for same day appointment complaining of painful skin tag. She reports has this skin lesion on her upper back that has been giving her tingling sensation for about a month. She went to Urgent Care recently and they recommended evaluation here for removal or referral. She reports yesterday started to feel intense pain located in same area. Pain is so intense that she had to cover area since light touch including clothing is extremely sensitive and painful.  No other areas involved, no other skin lesions are present.  Review of Systems:  Per HPI  Objective:   Physical Exam: Gen:  NAD HEENT: Moist mucous membranes  CV: Regular rate and rhythm, no murmurs PULM: Clear to auscultation bilaterally.  EXT: No edema Skin: Nevi present on her left upper back, but without inflammatory changes. No skin lesions seen, no erythema or edema. Extreme tenderness to light touch of area above nevi. No calor.    Assessment & Plan:

## 2013-08-11 NOTE — Patient Instructions (Addendum)
The symptoms that you have may be consistent with Shingles, but this diagnosis is premature at this point. I have prescribed you a medication you can use topically as needed for pain. You may or may not develop skin rash, if this appear and diagnosis is more clear, you can come to get re-evaluated and oral medication may be necessary at that point.

## 2013-08-11 NOTE — Progress Notes (Signed)
Pt is here for a skin tag on left upper back... Seen on 11/30 at the [facility] Reports it is very painful when touched or anything comes in contact w/it Denies: drainage... She is alert w/no signs of acute distress.... Rmeza, CMA

## 2013-08-12 ENCOUNTER — Ambulatory Visit: Payer: Medicaid Other | Admitting: Sports Medicine

## 2013-08-12 ENCOUNTER — Telehealth: Payer: Self-pay | Admitting: *Deleted

## 2013-08-12 NOTE — Telephone Encounter (Signed)
Received fax request for prior authorization from pharmacy for Lidoderm patch.  Copy of Medicaid formulary and form placed in MD's box for completion.  Gaylene Brooks, RN

## 2013-08-13 ENCOUNTER — Telehealth: Payer: Self-pay | Admitting: Family Medicine

## 2013-08-13 ENCOUNTER — Ambulatory Visit: Payer: Medicaid Other | Admitting: Family Medicine

## 2013-08-13 MED ORDER — BENZOCAINE 20 % MT CREA: TOPICAL_CREAM | OROMUCOSAL | Status: DC

## 2013-08-13 NOTE — Telephone Encounter (Signed)
Received a page late this afternoon that patient was seen in clinic yesterday, by another provider, and prescribed lidoderm patch for a sensitive area of skin. This is reported to be possible early shingles outbreak, no signs of infection by note. Will call in benzocaine topical cream to be applied to the area. If this prescription is not covered she will need to by an OTC preporation of topical analgesic, her pharmacist can help her with this. If the area gets red or bumps appear she should make an appointment for same day to see if this is a shingles outbreak. Thanks.

## 2013-08-15 ENCOUNTER — Other Ambulatory Visit: Payer: Self-pay | Admitting: Family Medicine

## 2013-08-18 ENCOUNTER — Ambulatory Visit (INDEPENDENT_AMBULATORY_CARE_PROVIDER_SITE_OTHER): Payer: Medicaid Other | Admitting: Family Medicine

## 2013-08-18 ENCOUNTER — Encounter: Payer: Self-pay | Admitting: Family Medicine

## 2013-08-18 VITALS — BP 116/65 | HR 82 | Temp 98.8°F | Ht 62.0 in | Wt 128.0 lb

## 2013-08-18 DIAGNOSIS — O0001 Abdominal pregnancy with intrauterine pregnancy: Secondary | ICD-10-CM

## 2013-08-18 DIAGNOSIS — R208 Other disturbances of skin sensation: Secondary | ICD-10-CM

## 2013-08-18 DIAGNOSIS — R209 Unspecified disturbances of skin sensation: Secondary | ICD-10-CM

## 2013-08-18 DIAGNOSIS — N912 Amenorrhea, unspecified: Secondary | ICD-10-CM

## 2013-08-18 LAB — POCT URINE PREGNANCY: Preg Test, Ur: POSITIVE

## 2013-08-18 NOTE — Assessment & Plan Note (Signed)
Positive pregnancy test in office Patient to start prenatal vitamins she prefers her to become Instructed on first trimester prenatal care. Patient to start pregnancy medicated and make an appointment for first prenatal She would lab work at that time.

## 2013-08-18 NOTE — Progress Notes (Signed)
   Subjective:    Patient ID: Alejandra Conway, female    DOB: 1986/08/22, 27 y.o.   MRN: [id number]  HPI  Amenorrhea:  G3P1011 if pregnant. One elective AB. Positive preg x5 at home. Some breast tenderness and nausea. Pt and partner was trying to get pregnant.  Patient gets her care at this clinic for prior OB. She would like to obtain her PCP, myself, as her OB if at all possible. She has not applied for pregnancy Medicaid as of yet, needed positive test. LMP 11/4.  Reviewed all medication today.   Skin lesion: Patient seen at this clinic last week  For skin lesion/burning tingling pain of her left scapula region. Patient has tried topical analgesics without benefit. Sensation is focal and appears to be related to lesion. No fever, or signs of vesicles have appeared. Patient reports no redness or rash. Burning sensation has improved with application of cotton ball and tap to guard the area from being irritated.    Review of Systems Negative, with the exception of above mentioned in HPI     Objective:   Physical Exam BP 116/65  Pulse 82  Temp(Src) 98.8 F (37.1 C) (Oral)  Ht 5\' 2"  (1.575 m)  Wt 128 lb (58.06 kg)  BMI 23.41 kg/m2  LMP 07/15/2013 Gen: NAD. Happy and excited, with partner and child today.  CV: RRR  Chest: CTAB, no wheeze or crackles Abd: Soft. NTND. BS present Ext: No erythema. No edema.  Skin: No rashes, purpura or petechiae. Raised flesh toned nevi over left scapula region. Mild pigmentation at apez of nevi. Appears irritated. No erythema or drainage.

## 2013-08-18 NOTE — Patient Instructions (Signed)
Congratulations!!!! Apply for your pregnancy Medicaid as soon as possible so that we can start your prenatal visits. We will then be able to start her prenatal labs which were approved. You have an appointment for next Tuesday at 9:15 to have a full body skin check and mole removal.   Pregnancy If you are planning on getting pregnant, it is a good idea to make a preconception appointment with your caregiver to discuss having a healthy lifestyle before getting pregnant. This includes diet, weight, exercise, taking prenatal vitamins (especially folic acid, which helps prevent brain and spinal cord defects), avoiding alcohol, smoking and illegal drugs, medical problems (diabetes, convulsions), family history of genetic problems, working conditions, and immunizations. It is better to have knowledge of these things and do something about them before getting pregnant. During your pregnancy, it is important to follow certain guidelines in order to have a healthy baby. It is very important to get good prenatal care and follow your caregiver's instructions. Prenatal care includes all the medical care you receive before your baby's birth. This helps to prevent problems during the pregnancy and childbirth. HOME CARE INSTRUCTIONS   Start your prenatal visits by the 12th week of pregnancy or earlier, if possible. At first, appointments are usually scheduled monthly. They become more frequent in the last 2 months before delivery. It is important that you keep your caregiver's appointments and follow your caregiver's instructions regarding medication use, exercise, and diet.  During pregnancy, you are providing food for you and your baby. Eat a regular, well-balanced diet. Choose foods such as meat, fish, milk and other dairy products, vegetables, fruits, whole-grain breads and cereals. Your caregiver will inform you of the ideal weight gain depending on your current height and weight. Drink lots of liquids. Try to  drink 8 glasses of water a day.  Alcohol is associated with a number of birth defects including fetal alcohol syndrome. It is best to avoid alcohol completely. Smoking will cause low birth rate and prematurity. Use of alcohol and nicotine during your pregnancy also increases the chances that your child will be chemically dependent later in their life and may contribute to SIDS (Sudden Infant Death Syndrome).  Do not use illegal drugs.  Only take prescription or over-the-counter medications that are recommended by your caregiver. Other medications can cause genetic and physical problems in the baby.  Morning sickness can often be helped by keeping soda crackers at the bedside. Eat a few before getting up in the morning.  A sexual relationship may be continued until near the end of pregnancy if there are no other problems such as early (premature) leaking of amniotic fluid from the membranes, vaginal bleeding, painful intercourse or belly (abdominal) pain.  Exercise regularly. Check with your caregiver if you are unsure of the safety of some of your exercises.  Do not use hot tubs, steam rooms or saunas. These increase the risk of fainting and hurting yourself and the baby. Swimming is OK for exercise. Get plenty of rest, including afternoon naps when possible, especially in the third trimester.  Avoid toxic odors and chemicals.  Do not wear high heels. They may cause you to lose your balance and fall.  Do not lift over 5 pounds. If you do lift anything, lift with your legs and thighs, not your back.  Avoid long trips, especially in the third trimester.  If you have to travel out of the city or state, take a copy of your medical records with you. SEEK IMMEDIATE  MEDICAL CARE IF:   You develop an unexplained oral temperature above 102 F (38.9 C), or as your caregiver suggests.  You have leaking of fluid from the vagina. If leaking membranes are suspected, take your temperature and inform  your caregiver of this when you call.  There is vaginal spotting or bleeding. Notify your caregiver of the amount and how many pads are used.  You continue to feel sick to your stomach (nauseous) and have no relief from remedies suggested, or you throw up (vomit) blood or coffee ground like materials.  You develop upper abdominal pain.  You have round ligament discomfort in the lower abdominal area. This still must be evaluated by your caregiver.  You feel contractions of the uterus.  You do not feel the baby move, or there is less movement than before.  You have painful urination.  You have abnormal vaginal discharge.  You have persistent diarrhea.  You get a severe headache.  You have problems with your vision.  You develop muscle weakness.  You feel dizzy and faint.  You develop shortness of breath.  You develop chest pain.  You have back pain that travels down to your leg and feet.  You feel irregular or a very fast heartbeat.  You develop excessive weight gain in a short period of time (5 pounds in 3 to 5 days).  You are involved in a domestic violence situation. Document Released: 08/28/2005 Document Revised: 02/27/2012 Document Reviewed: 02/19/2009 [facility] Patient Information 2014 [place], [place].

## 2013-08-18 NOTE — Assessment & Plan Note (Signed)
Believe this tenderness is due to her nevi. Still without any distribution of shingles-type reaction. Has placed topical numbing cream but it doesn't seem to help much. Have scheduled a procedure to do complete skin check and mole removal on this coming Tuesday.

## 2013-08-26 ENCOUNTER — Ambulatory Visit (INDEPENDENT_AMBULATORY_CARE_PROVIDER_SITE_OTHER): Payer: Medicaid Other | Admitting: Family Medicine

## 2013-08-26 ENCOUNTER — Encounter: Payer: Self-pay | Admitting: Family Medicine

## 2013-08-26 VITALS — BP 101/61 | HR 80 | Temp 98.8°F | Ht 62.0 in | Wt 127.7 lb

## 2013-08-26 DIAGNOSIS — L989 Disorder of the skin and subcutaneous tissue, unspecified: Secondary | ICD-10-CM

## 2013-08-26 NOTE — Patient Instructions (Signed)
Please keep areas clean and dry. Apply ointment twice daily and fresh Band-Aid daily. Areas should heal with a small scab. You may observe some small bruising, especially in the right arm pit area. You can tylenol for pain. I do not suspect you will be in much pain at all.  Any complications please call.

## 2013-08-26 NOTE — Progress Notes (Signed)
   Subjective:    Patient ID: Alejandra Conway, female    DOB: [birth year], 27 y.o.   MRN: [id number]  HPI Upper body skin exam with nevi removal. Body inspection was performed today with identification of 5 moles for removal. The 2 moles present on scapula are irritated and ulcerated and need to be removed. Similar mole present in  right armpit that is also ulcerated.Dark pigmented moles on right flank and right periumbilical with irregular borders and appeared darker than the rest of moles on her body. Patient has a family history of malignant melanoma in a first cousin that died at a young age from melanoma under his  Arm. Patient's mother has history of multiple severe dysplastic nevi. Patient is currently approximately [redacted] weeks pregnant.   Review of Systems Negative, with the exception of above mentioned in HPI  Objective:   Physical Exam BP 101/61  Pulse 80  Temp(Src) 98.8 F (37.1 C) (Oral)  Ht 5\' 2"  (1.575 m)  Wt 127 lb 11.2 oz (57.924 kg)  BMI 23.35 kg/m2  LMP 07/15/2013 Gen: NAD. Anxious about procedure.  Skin:  1. Flat, dark brown ~ 4mm nevus with irregular borders Right periumbilical region  2. Flat, dark drown, ~4 mm nevus with irregular borders, Right flank area.  3. Raised irritated 5 mm pigmented soft mole right arm pit 4. Raised irritated, ulcerated 3 mm pigmented area left scapula, medial  5. Raised irritated 3 mm pigmented area left scapula, lateral (in bra line)  EXCISION OF SUSPICIOUS/Irritated SKIN LESIONS (5):  Risks (infection, pain, scarring, bleeding, not getting full margins, anesthetic risks, etc.) and benefits (finding out nature of this suspicious lesions) explained to [place] for this procedure.Alejandra Conway  expressed understanding that no guarantees about the results of this procedure can be made. She also expressed understanding that if the lesions is malignant (cancerous), subsequent wider excision may need to be done, along with referral to  dermatologist. After obtaining consent for excision of the suspicious moles in the locations described above, the area was anesthesized by field block using buffered 1% lidocaine with epinephrine achieving excellent results. Shave biopsies were performed at all 5 sites. Patient tolerated the procedure well. The layer below the epidermis did not show any pigmentation. The specimens was sent to pathology for biopsy. Alejandra Conway  was given wound care instruction and was instructed to take Tylenol or ibuprofen if any pain. In addition, she was instructed to return to the clinic if increased pain, erythema or other issues, Otherwise, she should follow-up in 7-14 days.

## 2013-08-26 NOTE — Assessment & Plan Note (Signed)
Patient with 3 ulcerated he repeated lesions to be removed today, along with 2 irregular dark pigmented moles. Family history malignant melanoma in cousin and severe dysplastic lesions in mother.  5 skin lesions removed today and sent to pathology. Patient instructed on wound care. followup in 14 days. Red Flags given to return to clinic sooner Please see progress note for procedure

## 2013-08-27 ENCOUNTER — Telehealth: Payer: Self-pay | Admitting: Family Medicine

## 2013-08-27 NOTE — Telephone Encounter (Signed)
Informed pt of moderate atypia-  dysplastic nevi results from her bx. I have requested she come in for incision of area to obtain all margins. Explained procedure and need for routine skin exams every 6 months - 12 months. Avoidance of tanning beds and Suncreen >30 while outside. Patient in understanding and states she will make an appointment.  Kuneff, Renee DO

## 2013-09-10 ENCOUNTER — Encounter: Payer: Self-pay | Admitting: Sports Medicine

## 2013-09-10 ENCOUNTER — Other Ambulatory Visit: Payer: Medicaid Other

## 2013-09-10 ENCOUNTER — Ambulatory Visit (INDEPENDENT_AMBULATORY_CARE_PROVIDER_SITE_OTHER): Payer: Medicaid Other | Admitting: Sports Medicine

## 2013-09-10 VITALS — Ht 62.0 in | Wt 127.0 lb

## 2013-09-10 DIAGNOSIS — M533 Sacrococcygeal disorders, not elsewhere classified: Secondary | ICD-10-CM

## 2013-09-10 DIAGNOSIS — Z331 Pregnant state, incidental: Secondary | ICD-10-CM

## 2013-09-10 LAB — HIV ANTIBODY (ROUTINE TESTING W REFLEX): HIV: NONREACTIVE

## 2013-09-10 NOTE — Progress Notes (Signed)
OB LABS DONE TODAY Alejandra Conway 

## 2013-09-10 NOTE — Progress Notes (Signed)
   Subjective:    Patient ID: Alejandra Conway, female    DOB: [birth year], 27 y.o.   MRN: [id number]  HPI chief complaint: Low back pain  Very pleasant 27 year old female comes in today complaining of chronic right-sided low back pain. She initially began to have pain in high school. She was diagnosed with a "bulging disc" and sent to physical therapy but was unable to attend due to time restraints. Since then she's had intermittent pain which became noticeably worse after delivering her first child 2-1/2 years ago. She describes an intermittent aching and popping along the right SI joint. It is worse with sitting or standing for long periods of time. It also interferes with activity such as running. She denies any groin pain. Denies any radiating pain down her legs. No associated numbness or tingling. She had x-rays done in August of this year of her lumbar spine which were unremarkable. No prior low back surgeries. She is currently 7-[redacted] weeks pregnant with her second child. Medical history is reviewed Allergies are reviewed    Review of Systems     Objective:   Physical Exam Well-developed, well-nourished. No acute distress  Lumbar spine: Full lumbar range of motion. No tenderness to palpation along the lumbar midline or over the SI joint. No tenderness in the sciatic notch. No spasm. Negative straight leg raise bilaterally. Strength is 5/5 both lower extremities with reflexes brisk and equal at the Achilles and patellar tendons bilaterally. No atrophy. 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 Her insurance may only allow for a single visit. I've explained to the patient that she ultimately needs core strengthening but that will be a challenge with her current pregnancy. I am also limited in the medications  that I can use due to her pregnancy. She will followup with me postpartum if needed.

## 2013-09-11 LAB — OBSTETRIC PANEL
Antibody Screen: NEGATIVE
Basophils Absolute: 0 10*3/uL (ref 0.0–0.1)
Basophils Relative: 0 % (ref 0–1)
Eosinophils Absolute: 0.1 10*3/uL (ref 0.0–0.7)
Eosinophils Relative: 1 % (ref 0–5)
HCT: 40 % (ref 36.0–46.0)
Hemoglobin: 13.6 g/dL (ref 12.0–15.0)
Hepatitis B Surface Ag: NEGATIVE
Lymphocytes Relative: 30 % (ref 12–46)
Lymphs Abs: 2.3 10*3/uL (ref 0.7–4.0)
MCH: 30 pg (ref 26.0–34.0)
MCHC: 34 g/dL (ref 30.0–36.0)
MCV: 88.3 fL (ref 78.0–100.0)
Monocytes Absolute: 0.6 10*3/uL (ref 0.1–1.0)
Monocytes Relative: 8 % (ref 3–12)
Neutro Abs: 4.7 10*3/uL (ref 1.7–7.7)
Neutrophils Relative %: 61 % (ref 43–77)
Platelets: 235 10*3/uL (ref 150–400)
RBC: 4.53 MIL/uL (ref 3.87–5.11)
RDW: 13.6 % (ref 11.5–15.5)
Rh Type: POSITIVE
Rubella: 1.22 Index — ABNORMAL HIGH (ref <0.90)
WBC: 7.7 10*3/uL (ref 4.0–10.5)

## 2013-09-11 LAB — SICKLE CELL SCREEN: Sickle Cell Screen: NEGATIVE

## 2013-09-11 NOTE — L&D Delivery Note (Signed)
`````  Attestation of Attending Supervision of Advanced Practitioner: Evaluation and management procedures were performed by the PA/NP/CNM/OB Fellow under my supervision/collaboration. Chart reviewed and agree with management and plan.  Alexcis Bicking V 04/18/2014 10:15 PM

## 2013-09-11 NOTE — L&D Delivery Note (Signed)
Delivery Note At 6:35 AM a viable female was delivered via Vaginal, Spontaneous Delivery (Presentation: ROA).  APGAR: 9, 9; weight TBD Placenta status: Intact, Spontaneous.  Cord: 3V with no complications  Anesthesia: None  Episiotomy: None Lacerations: None Suture Repair: N/A Est. Blood Loss (mL): 150  Mom to postpartum.  Baby to Couplet care / Skin to Skin.  Howard Pouch 04/12/2014, 6:52 AM

## 2013-09-12 LAB — CULTURE, OB URINE
Colony Count: NO GROWTH
Organism ID, Bacteria: NO GROWTH

## 2013-09-18 ENCOUNTER — Ambulatory Visit: Payer: Medicaid Other | Attending: Sports Medicine | Admitting: Physical Therapy

## 2013-09-18 DIAGNOSIS — O99891 Other specified diseases and conditions complicating pregnancy: Secondary | ICD-10-CM | POA: Insufficient documentation

## 2013-09-18 DIAGNOSIS — J45909 Unspecified asthma, uncomplicated: Secondary | ICD-10-CM | POA: Insufficient documentation

## 2013-09-18 DIAGNOSIS — O9989 Other specified diseases and conditions complicating pregnancy, childbirth and the puerperium: Secondary | ICD-10-CM

## 2013-09-18 DIAGNOSIS — M25559 Pain in unspecified hip: Secondary | ICD-10-CM | POA: Insufficient documentation

## 2013-09-18 DIAGNOSIS — IMO0001 Reserved for inherently not codable concepts without codable children: Secondary | ICD-10-CM | POA: Insufficient documentation

## 2013-09-19 ENCOUNTER — Encounter: Payer: Self-pay | Admitting: Family Medicine

## 2013-09-19 ENCOUNTER — Ambulatory Visit (INDEPENDENT_AMBULATORY_CARE_PROVIDER_SITE_OTHER): Payer: Medicaid Other | Admitting: Family Medicine

## 2013-09-19 ENCOUNTER — Other Ambulatory Visit (HOSPITAL_COMMUNITY)
Admission: RE | Admit: 2013-09-19 | Discharge: 2013-09-19 | Disposition: A | Discharge status: Home / Self Care | Payer: Medicaid Other | Source: Ambulatory Visit | Attending: Family Medicine | Admitting: Family Medicine

## 2013-09-19 VITALS — BP 113/63 | Temp 98.1°F | Wt 130.0 lb

## 2013-09-19 DIAGNOSIS — Z23 Encounter for immunization: Secondary | ICD-10-CM

## 2013-09-19 DIAGNOSIS — Z3480 Encounter for supervision of other normal pregnancy, unspecified trimester: Secondary | ICD-10-CM | POA: Insufficient documentation

## 2013-09-19 DIAGNOSIS — O0001 Abdominal pregnancy with intrauterine pregnancy: Secondary | ICD-10-CM

## 2013-09-19 DIAGNOSIS — Z3481 Encounter for supervision of other normal pregnancy, first trimester: Secondary | ICD-10-CM

## 2013-09-19 DIAGNOSIS — Z113 Encounter for screening for infections with a predominantly sexual mode of transmission: Secondary | ICD-10-CM | POA: Insufficient documentation

## 2013-09-19 LAB — OB RESULTS CONSOLE GC/CHLAMYDIA
Chlamydia: NEGATIVE
Gonorrhea: NEGATIVE

## 2013-09-19 NOTE — Progress Notes (Signed)
.    Subjective:    Alejandra Conway is a 28 y.o. female being seen today for her new obstetrical visit. She is at [redacted]w[redacted]d gestation. Patient reports headaches and nausea. Currently able to eat without frequent nausea.  Otherwise, no complaints. She reports her periods are not regular and is unsure if dates are correct. Beginning weight was 125 lbs. This is a wanted pregnancy for her and "Alejandra Conway".  Patient is planning on traveling at the beginning of March to [place] for a family trip. AVS on advice with travel during pregnancy.  Patient's last menstrual period was 07/15/2013.  Last pregnancy was delivered by NSVD at due date. No complications with pregnancy or delivery.  Menstrual History: OB History   Grav Para Term Preterm Abortions TAB SAB Ect Mult Living   3 1 1  0 1 1 0 0  1      Menarche age: age 42 Patient's last menstrual period was 07/15/2013.    The following portions of the patient's history were reviewed and updated as appropriate: allergies, current medications, past family history, past medical history, past social history, past surgical history and problem list.  Review of Systems Pertinent items are noted in HPI.   Objective:     BP 113/63  Temp(Src) 98.1 F (36.7 C)  Wt 130 lb (58.968 kg)  LMP 07/15/2013 Gen: NAD. Happy. CV: RRR  Chest: CTAB, no wheeze or crackles Abd: Soft. NTND. BS present Ext: No erythema. No edema.    Assessment:  G3P1011 @ [redacted]w[redacted]d by LMP (Unsure of dates)  integrated screening requested Flu vaccine needed   Plan:    Problem list reviewed and updated. Labs reviewed. PMH completed; with no concerns PHQ 9: completed Score 4.  Urine collected today GC urine.  Integrated screen: requested. MFM notified. Quad screen also requested, will order next visit.  Role of ultrasound in pregnancy discussed;  Dating, d/t irregular periods and request for integrated screening. Flu vac today Discussed safe traveling while pregnant. Do not foresee a  problem with her traveling during that time.  Discussed need to have re-excision of dysplastic nevi (moderate-severe). Advised pt to make appt ASAP. Advised against waiting until further along in pregnancy.  Follow up in 4 weeks for OB- prenatal care.

## 2013-09-19 NOTE — Assessment & Plan Note (Signed)
-   see OB note

## 2013-09-19 NOTE — Addendum Note (Signed)
Addended by: Corinna Capra on: 09/19/2013 04:39 PM   Modules accepted: Orders

## 2013-09-19 NOTE — Patient Instructions (Signed)
Pregnancy and Travel Most pregnant woman can safely travel until the last month of the pregnancy. If you are planning to travel while pregnant, the best time is between 14 and 28 weeks of the pregnancy. During this period of the pregnancy, morning sickness should be minimal and very few problems should develop. Here are a few things to remember when traveling:  Get a copy of your medical records and take them with you.  Limit or avoid travel if you have medical problems or problems with your pregnancy.  Discuss any trip with your caregiver and get examined shortly before leaving.  Get a good night's sleep before leaving.  Take your pillow with you, if there is room in your luggage.  Try to get names of doctors in the area you will be visiting.  Wear flat, comfortable shoes.  Rest when at your destination.  Eat a balanced diet, take your vitamins and supplements, and drink a lot of fluids.  Do not wear yourself out.  Do not ride on a motorcycle when pregnant. TRAVEL BY CAR  Wear your seat belt properly.  Sit as far away from the dashboard, if you are in the front seat, to avoid getting hit hard if the air bag deploys in an accident.  Stop about every 2 hours to use the restroom and walk around. This helps the circulation in your legs.  Keep water, crackers, and fruit in the car.  Do not travel for more than 6 hours a day.  Take a charged cell phone with you. TRAVEL BY BUS You are more confined in a bus making it hard to walk around every couple of hours.  Get out and walk around if and when the bus stops.  Move your arms and legs when seated. This helps with your circulation.  Take water, crackers, and fruit with you.  Most buses traveling long distances will have a restroom. Ask about that when making your reservation.  After a long trip, lie down for 30 or more minutes with your feet slightly raised.  Take a charged cell phone with you. TRAVEL BY TRAIN  There  usually is more than one restroom.  There is a dining room for meals.  There usually are sleepers for overnight and long trips. Ask about sleepers when making your reservation.  It is a good idea to lie down for 30 minutes with your feet elevated after your trip.  Take a charged cell phone with you. TRAVEL BY PLANE  Pregnant women may be restricted from [place] after a certain time of the pregnancy. Every airline has its own rules and regulations. You should ask about them when making your reservation.  Usually there are only 2 or 3 restrooms available for 150 or more passengers.  You should not fly above 7000 feet in an unpressurized plane.  You cannot get up and walk around freely.  Wear your seatbelt at all times.  Put all your medications and medical records in your carry-on bag.  Take water, crackers, and fruit with you.  Try to get a bulkhead or an aisle seat.  Avoid caffeine drinks and do not eat a big meal before flying.  There are special meals you can order when making your reservation, if the airline is serving meals.  Wear layered clothing because the temperature in the cabin can change.  Move your arms and legs while sitting to help with your circulation.  After a long trip, lie down for 30 or more minutes  with your feet slightly elevated.  Take a charged cell phone with you. TRAVEL BY CRUISE SHIP  You should check with the cruise line and ask several questions, such as:  Are pregnant women allowed on the cruise?  Is there a medical facility and doctor on board?  Does the ship dock in cities where there are doctors and medical facilities?  Rest and lie down with your feet raised for 30 or more minutes after arriving.  Take a charged cell phone with you.  Ask your caregiver if:  Any medications are safe for you to take if you get seasick.  It is safe for you to wear acupressure wristbands to prevent getting seasick. TRAVEL TO A FOREIGN  COUNTRY  Discuss the trip with your caregiver.  Get copies of your medical records and have them with you at all times.  Take your medications and important documents with you in your carry-on bag.  Ask your caregiver if there are any medications that are safe to take for diarrhea, constipation, nausea, or vomiting.  Check to be sure you have the required vaccinations for entry into that country. The Center for Disease Control and Prevention (CDC) can advise you [phone])334-7237) about vaccination requirements.  Ask your medical insurance company if you and your baby will have insurance coverage while traveling outside the country. Emergency medical and evacuation insurance information is available at the following website: www.travel.guard.com  Before making plans to go to a foreign country, call the [employer] for the name of the [employer] to get information regarding the embassy's location, weather, any prevalent disease problems, crime, and other questions you might have.  It is a good idea to make copies of your medical records, passport, and other important papers, in case you lose the originals.  Do not eat uncooked foods of any kind.  Drink bottled water and do not use ice.  Wash fruits and vegetables with soapy hot water.  Only drink pasteurized milk.  After you arrive, ask for the location of doctors and hospitals.  After arriving, lie down for 30 minutes or more and get some rest.  Your cell phone may not work in many foreign countries. Ask your mobile phone carrier about cell phone coverage. Document Released: 08/10/2008 Document Revised: 11/20/2011 Document Reviewed: 08/10/2008 [facility] Patient Information 2014 [place]. Pregnancy - First Trimester During sexual intercourse, millions of sperm go into the vagina. Only 1 sperm will penetrate and fertilize the female egg while it is in the Fallopian tube. One week later, the fertilized egg  implants into the wall of the uterus. An embryo begins to develop into a baby. At 6 to 8 weeks, the eyes and face are formed and the heartbeat can be seen on ultrasound. At the end of 12 weeks (first trimester), all the baby's organs are formed. Now that you are pregnant, you will want to do everything you can to have a healthy baby. Two of the most important things are to get good prenatal care and follow your caregiver's instructions. Prenatal care is all the medical care you receive before the baby's birth. It is given to prevent, find, and treat problems during the pregnancy and childbirth. PRENATAL EXAMS  During prenatal visits, your weight, blood pressure, and urine are checked. This is done to make sure you are healthy and progressing normally during the pregnancy.  A pregnant woman should gain 25 to 35 pounds during the pregnancy. However, if you are overweight or underweight, your caregiver  will advise you regarding your weight.  Your caregiver will ask and answer questions for you.  Blood work, cervical cultures, other necessary tests, and a Pap test are done during your prenatal exams. These tests are done to check on your health and the probable health of your baby. Tests are strongly recommended and done for HIV with your permission. This is the virus that causes AIDS. These tests are done because medicines can be given to help prevent your baby from being born with this infection should you have been infected without knowing it. Blood work is also used to find out your blood type, previous infections, and follow your blood levels (hemoglobin).  Low hemoglobin (anemia) is common during pregnancy. Iron and vitamins are given to help prevent this. Later in the pregnancy, blood tests for diabetes will be done along with any other tests if any problems develop.  You may need other tests to make sure you and the baby are doing well. CHANGES DURING THE FIRST TRIMESTER  Your body goes through  many changes during pregnancy. They vary from person to person. Talk to your caregiver about changes you notice and are concerned about. Changes can include:  Your menstrual period stops.  The egg and sperm carry the genes that determine what you look like. Genes from you and your partner are forming a baby. The female genes determine whether the baby is a boy or a girl.  Your body increases in girth and you may feel bloated.  Feeling sick to your stomach (nauseous) and throwing up (vomiting). If the vomiting is uncontrollable, call your caregiver.  Your breasts will begin to enlarge and become tender.  Your nipples may stick out more and become darker.  The need to urinate more. Painful urination may mean you have a bladder infection.  Tiring easily.  Loss of appetite.  Cravings for certain kinds of food.  At first, you may gain or lose a couple of pounds.  You may have changes in your emotions from day to day (excited to be pregnant or concerned something may go wrong with the pregnancy and baby).  You may have more vivid and strange dreams. HOME CARE INSTRUCTIONS   It is very important to avoid all smoking, alcohol and non-prescribed drugs during your pregnancy. These affect the formation and growth of the baby. Avoid chemicals while pregnant to ensure the delivery of a healthy infant.  Start your prenatal visits by the 12th week of pregnancy. They are usually scheduled monthly at first, then more often in the last 2 months before delivery. Keep your caregiver's appointments. Follow your caregiver's instructions regarding medicine use, blood and lab tests, exercise, and diet.  During pregnancy, you are providing food for you and your baby. Eat regular, well-balanced meals. Choose foods such as meat, fish, milk and other low fat dairy products, vegetables, fruits, and whole-grain breads and cereals. Your caregiver will tell you of the ideal weight gain.  You can help morning  sickness by keeping soda crackers at the bedside. Eat a couple before arising in the morning. You may want to use the crackers without salt on them.  Eating 4 to 5 small meals rather than 3 large meals a day also may help the nausea and vomiting.  Drinking liquids between meals instead of during meals also seems to help nausea and vomiting.  A physical sexual relationship may be continued throughout pregnancy if there are no other problems. Problems may be early (premature) leaking of amniotic  fluid from the membranes, vaginal bleeding, or belly (abdominal) pain.  Exercise regularly if there are no restrictions. Check with your caregiver or physical therapist if you are unsure of the safety of some of your exercises. Greater weight gain will occur in the last 2 trimesters of pregnancy. Exercising will help:  Control your weight.  Keep you in shape.  Prepare you for labor and delivery.  Help you lose your pregnancy weight after you deliver your baby.  Wear a good support or jogging bra for breast tenderness during pregnancy. This may help if worn during sleep too.  Ask when prenatal classes are available. Begin classes when they are offered.  Do not use hot tubs, steam rooms, or saunas.  Wear your seat belt when driving. This protects you and your baby if you are in an accident.  Avoid raw meat, uncooked cheese, cat litter boxes, and soil used by cats throughout the pregnancy. These carry germs that can cause birth defects in the baby.  The first trimester is a good time to visit your dentist for your dental health. Getting your teeth cleaned is okay. Use a softer toothbrush and brush gently during pregnancy.  Ask for help if you have financial, counseling, or nutritional needs during pregnancy. Your caregiver will be able to offer counseling for these needs as well as refer you for other special needs.  Do not take any medicines or herbs unless told by your caregiver.  Inform your  caregiver if there is any mental or physical domestic violence.  Make a list of emergency phone numbers of family, friends, hospital, and police and fire departments.  Write down your questions. Take them to your prenatal visit.  Do not douche.  Do not cross your legs.  If you have to stand for long periods of time, rotate you feet or take small steps in a circle.  You may have more vaginal secretions that may require a sanitary pad. Do not use tampons or scented sanitary pads. MEDICINES AND DRUG USE IN PREGNANCY  Take prenatal vitamins as directed. The vitamin should contain 1 milligram of folic acid. Keep all vitamins out of reach of children. Only a couple vitamins or tablets containing iron may be fatal to a baby or young child when ingested.  Avoid use of all medicines, including herbs, over-the-counter medicines, not prescribed or suggested by your caregiver. Only take over-the-counter or prescription medicines for pain, discomfort, or fever as directed by your caregiver. Do not use aspirin, ibuprofen, or naproxen unless directed by your caregiver.  Let your caregiver also know about herbs you may be using.  Alcohol is related to a number of birth defects. This includes fetal alcohol syndrome. All alcohol, in any form, should be avoided completely. Smoking will cause low birth rate and premature babies.  Street or illegal drugs are very harmful to the baby. They are absolutely forbidden. A baby born to an addicted mother will be addicted at birth. The baby will go through the same withdrawal an adult does.  Let your caregiver know about any medicines that you have to take and for what reason you take them. SEEK MEDICAL CARE IF:  You have any concerns or worries during your pregnancy. It is better to call with your questions if you feel they cannot wait, rather than worry about them. SEEK IMMEDIATE MEDICAL CARE IF:   An unexplained oral temperature above 102 F (38.9 C) develops,  or as your caregiver suggests.  You have leaking of  fluid from the vagina (birth canal). If leaking membranes are suspected, take your temperature and inform your caregiver of this when you call.  There is vaginal spotting or bleeding. Notify your caregiver of the amount and how many pads are used.  You develop a bad smelling vaginal discharge with a change in the color.  You continue to feel sick to your stomach (nauseated) and have no relief from remedies suggested. You vomit blood or coffee ground-like materials.  You lose more than 2 pounds of weight in 1 week.  You gain more than 2 pounds of weight in 1 week and you notice swelling of your face, hands, feet, or legs.  You gain 5 pounds or more in 1 week (even if you do not have swelling of your hands, face, legs, or feet).  You get exposed to [place] measles and have never had them.  You are exposed to fifth disease or chickenpox.  You develop belly (abdominal) pain. Round ligament discomfort is a common non-cancerous (benign) cause of abdominal pain in pregnancy. Your caregiver still must evaluate this.  You develop headache, fever, diarrhea, pain with urination, or shortness of breath.  You fall or are in a car accident or have any kind of trauma.  There is mental or physical violence in your home. Document Released: 08/22/2001 Document Revised: 05/22/2012 Document Reviewed: 02/23/2009 [facility] Patient Information 2014 [place].

## 2013-09-23 ENCOUNTER — Other Ambulatory Visit: Payer: Self-pay | Admitting: Family Medicine

## 2013-09-23 DIAGNOSIS — Z3682 Encounter for antenatal screening for nuchal translucency: Secondary | ICD-10-CM

## 2013-09-24 ENCOUNTER — Ambulatory Visit (HOSPITAL_COMMUNITY)
Admission: RE | Admit: 2013-09-24 | Discharge: 2013-09-24 | Disposition: A | Discharge status: Home / Self Care | Payer: Medicaid Other | Source: Ambulatory Visit | Attending: Family Medicine | Admitting: Family Medicine

## 2013-09-24 ENCOUNTER — Other Ambulatory Visit: Payer: Self-pay | Admitting: Family Medicine

## 2013-09-24 DIAGNOSIS — Z3689 Encounter for other specified antenatal screening: Secondary | ICD-10-CM | POA: Insufficient documentation

## 2013-09-24 DIAGNOSIS — Z3687 Encounter for antenatal screening for uncertain dates: Secondary | ICD-10-CM

## 2013-09-26 ENCOUNTER — Ambulatory Visit (HOSPITAL_COMMUNITY): Payer: Medicaid Other

## 2013-09-26 ENCOUNTER — Other Ambulatory Visit (HOSPITAL_COMMUNITY): Payer: Medicaid Other

## 2013-10-07 LAB — OB RESULTS CONSOLE GC/CHLAMYDIA
Chlamydia: NEGATIVE
Gonorrhea: NEGATIVE

## 2013-10-09 ENCOUNTER — Ambulatory Visit (HOSPITAL_COMMUNITY)
Admission: RE | Admit: 2013-10-09 | Discharge: 2013-10-09 | Disposition: A | Discharge status: Home / Self Care | Payer: Medicaid Other | Source: Ambulatory Visit | Attending: Family Medicine | Admitting: Family Medicine

## 2013-10-09 ENCOUNTER — Other Ambulatory Visit: Payer: Self-pay

## 2013-10-09 ENCOUNTER — Encounter (HOSPITAL_COMMUNITY): Payer: Self-pay

## 2013-10-09 DIAGNOSIS — Z3682 Encounter for antenatal screening for nuchal translucency: Secondary | ICD-10-CM

## 2013-10-09 DIAGNOSIS — O351XX0 Maternal care for (suspected) chromosomal abnormality in fetus, not applicable or unspecified: Secondary | ICD-10-CM | POA: Insufficient documentation

## 2013-10-09 DIAGNOSIS — O3510X0 Maternal care for (suspected) chromosomal abnormality in fetus, unspecified, not applicable or unspecified: Secondary | ICD-10-CM | POA: Insufficient documentation

## 2013-10-09 DIAGNOSIS — Z3689 Encounter for other specified antenatal screening: Secondary | ICD-10-CM | POA: Insufficient documentation

## 2013-10-14 ENCOUNTER — Other Ambulatory Visit: Payer: Self-pay | Admitting: Family Medicine

## 2013-10-14 ENCOUNTER — Encounter: Payer: Self-pay | Admitting: Family Medicine

## 2013-10-14 ENCOUNTER — Ambulatory Visit (INDEPENDENT_AMBULATORY_CARE_PROVIDER_SITE_OTHER): Payer: Medicaid Other | Admitting: Family Medicine

## 2013-10-14 VITALS — BP 120/64 | HR 83 | Temp 98.9°F | Ht 62.0 in | Wt 129.0 lb

## 2013-10-14 DIAGNOSIS — D239 Other benign neoplasm of skin, unspecified: Secondary | ICD-10-CM | POA: Insufficient documentation

## 2013-10-14 NOTE — Assessment & Plan Note (Signed)
Pt tolerated punch bx today. Please see procedure note.

## 2013-10-14 NOTE — Patient Instructions (Signed)
Keep dressing clean and dry. Leave dressing in place for 12-24 hours. Keep area dry for 24 hours and then you can shower as normal. Let steri-strips fall off on their own.  Any increased bleeding or drainage please be seen immediatly.

## 2013-10-14 NOTE — Progress Notes (Signed)
   Subjective:    Patient ID: Alejandra Conway, female    DOB: [birth year], 28 y.o.   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 With margins positive.  Review of Systems Negative, with the exception of above mentioned in HPI   Objective:   Physical Exam BP 120/64  Pulse 83  Temp(Src) 98.9 F (37.2 C) (Oral)  Ht 5\' 2"  (1.575 m)  Wt 129 lb (58.514 kg)  BMI 23.59 kg/m2  LMP 07/15/2013 Skin: Small area of pigmentation left from shave bx. Difficult area to bx d/t it is located in as stretch mark.    The skin biopsy was performed on the right periumbilical region. The patient was consented for skin biopsy. The complications, instructions as to how the procedure will be performed, and postoperative instructions were given to the patient.  PROCEDURE: The site was cleaned with antiseptic. 1 cc of  local anesthetic (2% lidocaine with epi) injected surrounding site. A 4 mm punch biopsy was performed obtaing retained pigmented area from shave bx.  The site was then checked for bleeding. Steri-strips was placed once hemostasis was achieved, a local antibiotic ointment was placed and the site was bandaged.  The patient was not on any anticoagulant medications. There were also no other medications which would affect the ability to conduct the skin biopsy. The patient was further instructed to keep the site completely dry for the next 24 hours, after which a new Band-Aid and antibiotic ointment should be applied to the area. They were further instructed to avoid getting the site dirty or infected. The patient completed the procedure without any complications.   F/U: 1-2 weeks.   The biopsy will  be sent for analysis.

## 2013-10-16 ENCOUNTER — Ambulatory Visit (INDEPENDENT_AMBULATORY_CARE_PROVIDER_SITE_OTHER): Payer: Medicaid Other | Admitting: Family Medicine

## 2013-10-16 VITALS — BP 107/47 | Temp 98.1°F | Wt 130.0 lb

## 2013-10-16 DIAGNOSIS — R300 Dysuria: Secondary | ICD-10-CM

## 2013-10-16 DIAGNOSIS — Z3480 Encounter for supervision of other normal pregnancy, unspecified trimester: Secondary | ICD-10-CM

## 2013-10-16 LAB — POCT URINALYSIS DIPSTICK
BILIRUBIN UA: NEGATIVE
Blood, UA: NEGATIVE
GLUCOSE UA: NEGATIVE
Ketones, UA: NEGATIVE
Leukocytes, UA: NEGATIVE
NITRITE UA: NEGATIVE
PH UA: 6.5
Protein, UA: NEGATIVE
Spec Grav, UA: 1.025
UROBILINOGEN UA: 0.2

## 2013-10-16 NOTE — Patient Instructions (Signed)
Second Trimester of Pregnancy The second trimester is from week 13 through week 28, months 4 through 6. The second trimester is often a time when you feel your best. Your body has also adjusted to being pregnant, and you begin to feel better physically. Usually, morning sickness has lessened or quit completely, you may have more energy, and you may have an increase in appetite. The second trimester is also a time when the fetus is growing rapidly. At the end of the sixth month, the fetus is about 9 inches long and weighs about 1 pounds. You will likely begin to feel the baby move (quickening) between 18 and 20 weeks of the pregnancy. BODY CHANGES Your body goes through many changes during pregnancy. The changes vary from woman to woman.   Your weight will continue to increase. You will notice your lower abdomen bulging out.  You may begin to get stretch marks on your hips, abdomen, and breasts.  You may develop headaches that can be relieved by medicines approved by your caregiver.  You may urinate more often because the fetus is pressing on your bladder.  You may develop or continue to have heartburn as a result of your pregnancy.  You may develop constipation because certain hormones are causing the muscles that push waste through your intestines to slow down.  You may develop hemorrhoids or swollen, bulging veins (varicose veins).  You may have back pain because of the weight gain and pregnancy hormones relaxing your joints between the bones in your pelvis and as a result of a shift in weight and the muscles that support your balance.  Your breasts will continue to grow and be tender.  Your gums may bleed and may be sensitive to brushing and flossing.  Dark spots or blotches (chloasma, mask of pregnancy) may develop on your face. This will likely fade after the baby is born.  A dark line from your belly button to the pubic area (linea nigra) may appear. This will likely fade after the  baby is born. WHAT TO EXPECT AT YOUR PRENATAL VISITS During a routine prenatal visit:  You will be weighed to make sure you and the fetus are growing normally.  Your blood pressure will be taken.  Your abdomen will be measured to track your baby's growth.  The fetal heartbeat will be listened to.  Any test results from the previous visit will be discussed. Your caregiver may ask you:  How you are feeling.  If you are feeling the baby move.  If you have had any abnormal symptoms, such as leaking fluid, bleeding, severe headaches, or abdominal cramping.  If you have any questions. Other tests that may be performed during your second trimester include:  Blood tests that check for:  Low iron levels (anemia).  Gestational diabetes (between 24 and 28 weeks).  Rh antibodies.  Urine tests to check for infections, diabetes, or protein in the urine.  An ultrasound to confirm the proper growth and development of the baby.  An amniocentesis to check for possible genetic problems.  Fetal screens for spina bifida and Down syndrome. HOME CARE INSTRUCTIONS   Avoid all smoking, herbs, alcohol, and unprescribed drugs. These chemicals affect the formation and growth of the baby.  Follow your caregiver's instructions regarding medicine use. There are medicines that are either safe or unsafe to take during pregnancy.  Exercise only as directed by your caregiver. Experiencing uterine cramps is a good sign to stop exercising.  Continue to eat regular,   healthy meals.  Wear a good support bra for breast tenderness.  Do not use hot tubs, steam rooms, or saunas.  Wear your seat belt at all times when driving.  Avoid raw meat, uncooked cheese, cat litter boxes, and soil used by cats. These carry germs that can cause birth defects in the baby.  Take your prenatal vitamins.  Try taking a stool softener (if your caregiver approves) if you develop constipation. Eat more high-fiber foods,  such as fresh vegetables or fruit and whole grains. Drink plenty of fluids to keep your urine clear or pale yellow.  Take warm sitz baths to soothe any pain or discomfort caused by hemorrhoids. Use hemorrhoid cream if your caregiver approves.  If you develop varicose veins, wear support hose. Elevate your feet for 15 minutes, 3 4 times a day. Limit salt in your diet.  Avoid heavy lifting, wear low heel shoes, and practice good posture.  Rest with your legs elevated if you have leg cramps or low back pain.  Visit your dentist if you have not gone yet during your pregnancy. Use a soft toothbrush to brush your teeth and be gentle when you floss.  A sexual relationship may be continued unless your caregiver directs you otherwise.  Continue to go to all your prenatal visits as directed by your caregiver. SEEK MEDICAL CARE IF:   You have dizziness.  You have mild pelvic cramps, pelvic pressure, or nagging pain in the abdominal area.  You have persistent nausea, vomiting, or diarrhea.  You have a bad smelling vaginal discharge.  You have pain with urination. SEEK IMMEDIATE MEDICAL CARE IF:   You have a fever.  You are leaking fluid from your vagina.  You have spotting or bleeding from your vagina.  You have severe abdominal cramping or pain.  You have rapid weight gain or loss.  You have shortness of breath with chest pain.  You notice sudden or extreme swelling of your face, hands, ankles, feet, or legs.  You have not felt your baby move in over an hour.  You have severe headaches that do not go away with medicine.  You have vision changes. Document Released: 08/22/2001 Document Revised: 04/30/2013 Document Reviewed: 10/29/2012 [employer] Patient Information 2014 [employer].  

## 2013-10-16 NOTE — Progress Notes (Signed)
[place] @ 101w2d. Pt has no complaints other than occasional headaches (migraines). No Leaking, gush of fluid or vaginal bleeding. Overall she is doing well. [name] is present today.  O: ABd gravid. bx site abd healing well. No drainage or erythema.   A/P:  - FHR present (~160) - urine today - [place] NORMAL, genetic screen normal - Bleeding precautions and MAU reviewed - advised pt to obtain medical records prior to going to [place] in March - 4 week f/u: Quad screen at that time.

## 2013-10-19 ENCOUNTER — Encounter (HOSPITAL_COMMUNITY): Payer: Self-pay | Admitting: *Deleted

## 2013-10-19 ENCOUNTER — Inpatient Hospital Stay (HOSPITAL_COMMUNITY)
Admission: AD | Admit: 2013-10-19 | Discharge: 2013-10-20 | Disposition: A | Discharge status: Home / Self Care | Payer: Medicaid Other | Source: Ambulatory Visit | Attending: Obstetrics & Gynecology | Admitting: Obstetrics & Gynecology

## 2013-10-19 DIAGNOSIS — O99891 Other specified diseases and conditions complicating pregnancy: Secondary | ICD-10-CM | POA: Insufficient documentation

## 2013-10-19 DIAGNOSIS — G43909 Migraine, unspecified, not intractable, without status migrainosus: Secondary | ICD-10-CM | POA: Insufficient documentation

## 2013-10-19 DIAGNOSIS — O9989 Other specified diseases and conditions complicating pregnancy, childbirth and the puerperium: Principal | ICD-10-CM

## 2013-10-19 DIAGNOSIS — H53149 Visual discomfort, unspecified: Secondary | ICD-10-CM | POA: Insufficient documentation

## 2013-10-19 HISTORY — DX: Migraine, unspecified, not intractable, without status migrainosus: G43.909

## 2013-10-19 MED ORDER — CYCLOBENZAPRINE HCL 10 MG PO TABS
10.0000 mg | ORAL_TABLET | Freq: Once | ORAL | Status: AC
  Administered 2013-10-19: 10 mg via ORAL
  Filled 2013-10-19: qty 1

## 2013-10-19 MED ORDER — SUMATRIPTAN SUCCINATE 25 MG PO TABS
25.0000 mg | ORAL_TABLET | Freq: Once | ORAL | Status: AC
  Administered 2013-10-19: 25 mg via ORAL
  Filled 2013-10-19: qty 1

## 2013-10-19 NOTE — MAU Provider Note (Signed)
  History     CSN: [id number]  Arrival date and time: 10/19/13 2154   First Provider Initiated Contact with Patient 10/19/13 2225      Chief Complaint  Patient presents with  . Headache   HPI  Pt is a 28 yo G3P1011 at [redacted]w[redacted]d wks IUP here with report of migraine headache x 5 days.  Pain is described as pounding in occipital area that radiates throughout upper head.  +photophobia.  Denies nausea and vomiting or vision changes.    Past Medical History  Diagnosis Date  . Abnormal Pap smear   . Asthma   . Migraine     History reviewed. No pertinent past surgical history.  Family History  Problem Relation Age of Onset  . Heart disease Mother   . Alkaptonuria Son   . Heart disease Paternal Uncle   . Cancer Maternal Grandfather     History  Substance Use Topics  . Smoking status: Never Smoker   . Smokeless tobacco: Not on file  . Alcohol Use: No    Allergies:  Allergies  Allergen Reactions  . Pseudoephedrine     REACTION: SOB    Prescriptions prior to admission  Medication Sig Dispense Refill  . acetaminophen (TYLENOL) 500 MG tablet Take 1,000 mg by mouth every 6 (six) hours as needed.      Marland Kitchen albuterol (PROVENTIL HFA;VENTOLIN HFA) 108 (90 BASE) MCG/ACT inhaler Inhale 2 puffs into the lungs every 6 (six) hours as needed for shortness of breath (with exercise).  1 Inhaler  4  . Prenatal Vit-Fe Fumarate-FA (PRENATAL MULTIVITAMIN) TABS tablet Take 1 tablet by mouth daily at 12 noon.      . cyclobenzaprine (FLEXERIL) 10 MG tablet Take 1 tablet (10 mg total) by mouth 3 (three) times daily as needed for muscle spasms.  30 tablet  0    Review of Systems  Constitutional: Negative for fever.  HENT: Negative for sore throat.   Eyes: Positive for photophobia. Negative for blurred vision and double vision.  Respiratory: Negative for cough and shortness of breath.   Cardiovascular: Positive for palpitations.  Gastrointestinal: Negative for nausea, vomiting and abdominal pain.   Neurological: Positive for headaches. Negative for dizziness.  All other systems reviewed and are negative.   Physical Exam   Blood pressure 114/59, pulse 87, temperature 98.7 F (37.1 C), temperature source Oral, resp. rate 20, last menstrual period 07/15/2013, SpO2 100.00%.  Physical Exam  Constitutional: She is oriented to person, place, and time. She appears well-developed and well-nourished. No distress.  HENT:  Head: Normocephalic.  Eyes: EOM are normal. Pupils are equal, round, and reactive to light.  Neck: Normal range of motion. Neck supple.  Cardiovascular: Normal rate, regular rhythm and normal heart sounds.   Respiratory: Effort normal and breath sounds normal. No respiratory distress.  Musculoskeletal: Normal range of motion. She exhibits no edema.  Neurological: She is alert and oriented to person, place, and time. She has normal reflexes.  Skin: Skin is warm and dry.    MAU Course  Procedures  Flexeril 10 mg PO > pt reports no relief Imitrex 25 mg PO > pt reports headache decreasing Assessment and Plan  28 yo G3P1011 at [redacted]w[redacted]d wks IUP Migraine Headache  Plan: Discharge to home RX Imitrex 50 mg PO, may repeat x 1 in 2 hours.  Max 200 mg in 24 hour. Refer to Michaelene Song in [place] 10/19/2013, 10:28 PM

## 2013-10-19 NOTE — MAU Note (Signed)
Patient complains of a migraine for the last 5 days. Pain is worse when walking, sounds and lights. Been having migraines that last anywhere from 3-4 days. Taken tylenol with no relief

## 2013-10-20 DIAGNOSIS — G43909 Migraine, unspecified, not intractable, without status migrainosus: Secondary | ICD-10-CM

## 2013-10-20 MED ORDER — SUMATRIPTAN SUCCINATE 50 MG PO TABS: ORAL_TABLET | ORAL | Status: DC

## 2013-10-20 NOTE — Discharge Instructions (Signed)
Migraine Headache A migraine headache is an intense, throbbing pain on one or both sides of your head. A migraine can last for 30 minutes to several hours. CAUSES  The exact cause of a migraine headache is not always known. However, a migraine may be caused when nerves in the brain become irritated and release chemicals that cause inflammation. This causes pain. Certain things may also trigger migraines, such as:  Alcohol.  Smoking.  Stress.  Menstruation.  Aged cheeses.  Foods or drinks that contain nitrates, glutamate, aspartame, or tyramine.  Lack of sleep.  Chocolate.  Caffeine.  Hunger.  Physical exertion.  Fatigue.  Medicines used to treat chest pain (nitroglycerine), birth control pills, estrogen, and some blood pressure medicines. SIGNS AND SYMPTOMS  Pain on one or both sides of your head.  Pulsating or throbbing pain.  Severe pain that prevents daily activities.  Pain that is aggravated by any physical activity.  Nausea, vomiting, or both.  Dizziness.  Pain with exposure to bright lights, loud noises, or activity.  General sensitivity to bright lights, loud noises, or smells. Before you get a migraine, you may get warning signs that a migraine is coming (aura). An aura may include:  Seeing flashing lights.  Seeing bright spots, halos, or zig-zag lines.  Having tunnel vision or blurred vision.  Having feelings of numbness or tingling.  Having trouble talking.  Having muscle weakness. DIAGNOSIS  A migraine headache is often diagnosed based on:  Symptoms.  Physical exam.  A CT scan or MRI of your head. These imaging tests cannot diagnose migraines, but they can help rule out other causes of headaches. TREATMENT Medicines may be given for pain and nausea. Medicines can also be given to help prevent recurrent migraines.  HOME CARE INSTRUCTIONS  Only take over-the-counter or prescription medicines for pain or discomfort as directed by your  health care provider. The use of long-term narcotics is not recommended.  Lie down in a dark, quiet room when you have a migraine.  Keep a journal to find out what may trigger your migraine headaches. For example, write down:  What you eat and drink.  How much sleep you get.  Any change to your diet or medicines.  Limit alcohol consumption.  Quit smoking if you smoke.  Get 7 9 hours of sleep, or as recommended by your health care provider.  Limit stress.  Keep lights dim if bright lights bother you and make your migraines worse. SEEK IMMEDIATE MEDICAL CARE IF:   Your migraine becomes severe.  You have a fever.  You have a stiff neck.  You have vision loss.  You have muscular weakness or loss of muscle control.  You start losing your balance or have trouble walking.  You feel faint or pass out.  You have severe symptoms that are different from your first symptoms. MAKE SURE YOU:   Understand these instructions.  Will watch your condition.  Will get help right away if you are not doing well or get worse. Document Released: 08/28/2005 Document Revised: 06/18/2013 Document Reviewed: 05/05/2013 [employer] Patient Information 2014 [employer].  

## 2013-11-07 ENCOUNTER — Ambulatory Visit (INDEPENDENT_AMBULATORY_CARE_PROVIDER_SITE_OTHER): Payer: Medicaid Other | Admitting: Family Medicine

## 2013-11-07 VITALS — BP 116/58 | Wt 130.3 lb

## 2013-11-07 DIAGNOSIS — O0001 Abdominal pregnancy with intrauterine pregnancy: Secondary | ICD-10-CM

## 2013-11-07 MED ORDER — SUMATRIPTAN SUCCINATE 50 MG PO TABS: ORAL_TABLET | ORAL | Status: DC

## 2013-11-07 NOTE — Progress Notes (Signed)
Kersey @ [redacted]w[redacted]d. No leak of fluid, VB, cramps or contractions. Pt is suffering from migraines frequently (1-2 a week). She was seen in the MAU for migraines and given flexeril and Imitrex. She was also asked to follow up with "migraine specialist" at [facility] clinic. [name] and her son is present today. She leaves for [place] tomorrow for a week.    O: BP 116/58  Wt 130 lb 4.8 oz (59.104 kg)  LMP 07/15/2013 Gravid  A/P: O positive  Anatomical [place] scheduled [facility] scheduled Recommended she contact migraine specialist at [facility] and follow up with her. FHR 140's Bleeding and Pain precautions discussed  Will need to discuss weight at next appointment if she does not start to gain  Next appt with OB clinic in 4 weeks

## 2013-11-07 NOTE — Patient Instructions (Signed)
Please make an appointment with OB clinic for your next appointment in 4 weeks.   Second Trimester of Pregnancy The second trimester is from week 13 through week 28, months 4 through 6. The second trimester is often a time when you feel your best. Your body has also adjusted to being pregnant, and you begin to feel better physically. Usually, morning sickness has lessened or quit completely, you may have more energy, and you may have an increase in appetite. The second trimester is also a time when the fetus is growing rapidly. At the end of the sixth month, the fetus is about 9 inches long and weighs about 1 pounds. You will likely begin to feel the baby move (quickening) between 18 and 20 weeks of the pregnancy. BODY CHANGES Your body goes through many changes during pregnancy. The changes vary from woman to woman.   Your weight will continue to increase. You will notice your lower abdomen bulging out.  You may begin to get stretch marks on your hips, abdomen, and breasts.  You may develop headaches that can be relieved by medicines approved by your caregiver.  You may urinate more often because the fetus is pressing on your bladder.  You may develop or continue to have heartburn as a result of your pregnancy.  You may develop constipation because certain hormones are causing the muscles that push waste through your intestines to slow down.  You may develop hemorrhoids or swollen, bulging veins (varicose veins).  You may have back pain because of the weight gain and pregnancy hormones relaxing your joints between the bones in your pelvis and as a result of a shift in weight and the muscles that support your balance.  Your breasts will continue to grow and be tender.  Your gums may bleed and may be sensitive to brushing and flossing.  Dark spots or blotches (chloasma, mask of pregnancy) may develop on your face. This will likely fade after the baby is born.  A dark line from your belly  button to the pubic area (linea nigra) may appear. This will likely fade after the baby is born. WHAT TO EXPECT AT YOUR PRENATAL VISITS During a routine prenatal visit:  You will be weighed to make sure you and the fetus are growing normally.  Your blood pressure will be taken.  Your abdomen will be measured to track your baby's growth.  The fetal heartbeat will be listened to.  Any test results from the previous visit will be discussed. Your caregiver may ask you:  How you are feeling.  If you are feeling the baby move.  If you have had any abnormal symptoms, such as leaking fluid, bleeding, severe headaches, or abdominal cramping.  If you have any questions. Other tests that may be performed during your second trimester include:  Blood tests that check for:  Low iron levels (anemia).  Gestational diabetes (between 24 and 28 weeks).  Rh antibodies.  Urine tests to check for infections, diabetes, or protein in the urine.  An ultrasound to confirm the proper growth and development of the baby.  An amniocentesis to check for possible genetic problems.  Fetal screens for spina bifida and Down syndrome. HOME CARE INSTRUCTIONS   Avoid all smoking, herbs, alcohol, and unprescribed drugs. These chemicals affect the formation and growth of the baby.  Follow your caregiver's instructions regarding medicine use. There are medicines that are either safe or unsafe to take during pregnancy.  Exercise only as directed by your  caregiver. Experiencing uterine cramps is a good sign to stop exercising.  Continue to eat regular, healthy meals.  Wear a good support bra for breast tenderness.  Do not use hot tubs, steam rooms, or saunas.  Wear your seat belt at all times when driving.  Avoid raw meat, uncooked cheese, cat litter boxes, and soil used by cats. These carry germs that can cause birth defects in the baby.  Take your prenatal vitamins.  Try taking a stool softener (if  your caregiver approves) if you develop constipation. Eat more high-fiber foods, such as fresh vegetables or fruit and whole grains. Drink plenty of fluids to keep your urine clear or pale yellow.  Take warm sitz baths to soothe any pain or discomfort caused by hemorrhoids. Use hemorrhoid cream if your caregiver approves.  If you develop varicose veins, wear support hose. Elevate your feet for 15 minutes, 3 4 times a day. Limit salt in your diet.  Avoid heavy lifting, wear low heel shoes, and practice good posture.  Rest with your legs elevated if you have leg cramps or low back pain.  Visit your dentist if you have not gone yet during your pregnancy. Use a soft toothbrush to brush your teeth and be gentle when you floss.  A sexual relationship may be continued unless your caregiver directs you otherwise.  Continue to go to all your prenatal visits as directed by your caregiver. SEEK MEDICAL CARE IF:   You have dizziness.  You have mild pelvic cramps, pelvic pressure, or nagging pain in the abdominal area.  You have persistent nausea, vomiting, or diarrhea.  You have a bad smelling vaginal discharge.  You have pain with urination. SEEK IMMEDIATE MEDICAL CARE IF:   You have a fever.  You are leaking fluid from your vagina.  You have spotting or bleeding from your vagina.  You have severe abdominal cramping or pain.  You have rapid weight gain or loss.  You have shortness of breath with chest pain.  You notice sudden or extreme swelling of your face, hands, ankles, feet, or legs.  You have not felt your baby move in over an hour.  You have severe headaches that do not go away with medicine.  You have vision changes. Document Released: 08/22/2001 Document Revised: 04/30/2013 Document Reviewed: 10/29/2012 [facility] Patient Information 2014 [employer].

## 2013-11-18 ENCOUNTER — Inpatient Hospital Stay (HOSPITAL_COMMUNITY)
Admission: AD | Admit: 2013-11-18 | Discharge: 2013-11-18 | Disposition: A | Discharge status: Home / Self Care | Payer: Medicaid Other | Source: Ambulatory Visit | Attending: Obstetrics & Gynecology | Admitting: Obstetrics & Gynecology

## 2013-11-18 ENCOUNTER — Encounter (HOSPITAL_COMMUNITY): Payer: Self-pay | Admitting: *Deleted

## 2013-11-18 DIAGNOSIS — O9989 Other specified diseases and conditions complicating pregnancy, childbirth and the puerperium: Principal | ICD-10-CM

## 2013-11-18 DIAGNOSIS — K921 Melena: Secondary | ICD-10-CM | POA: Insufficient documentation

## 2013-11-18 DIAGNOSIS — R197 Diarrhea, unspecified: Secondary | ICD-10-CM | POA: Insufficient documentation

## 2013-11-18 DIAGNOSIS — J45909 Unspecified asthma, uncomplicated: Secondary | ICD-10-CM | POA: Insufficient documentation

## 2013-11-18 DIAGNOSIS — O99891 Other specified diseases and conditions complicating pregnancy: Secondary | ICD-10-CM | POA: Insufficient documentation

## 2013-11-18 DIAGNOSIS — A09 Infectious gastroenteritis and colitis, unspecified: Secondary | ICD-10-CM

## 2013-11-18 DIAGNOSIS — R109 Unspecified abdominal pain: Secondary | ICD-10-CM | POA: Insufficient documentation

## 2013-11-18 LAB — URINALYSIS, ROUTINE W REFLEX MICROSCOPIC
BILIRUBIN URINE: NEGATIVE
GLUCOSE, UA: NEGATIVE mg/dL
HGB URINE DIPSTICK: NEGATIVE
Ketones, ur: NEGATIVE mg/dL
Leukocytes, UA: NEGATIVE
Nitrite: NEGATIVE
Protein, ur: NEGATIVE mg/dL
SPECIFIC GRAVITY, URINE: 1.025 (ref 1.005–1.030)
UROBILINOGEN UA: 0.2 mg/dL (ref 0.0–1.0)
pH: 6 (ref 5.0–8.0)

## 2013-11-18 LAB — COMPREHENSIVE METABOLIC PANEL
ALT: 9 U/L (ref 0–35)
AST: 13 U/L (ref 0–37)
Albumin: 3 g/dL — ABNORMAL LOW (ref 3.5–5.2)
Alkaline Phosphatase: 51 U/L (ref 39–117)
BUN: 10 mg/dL (ref 6–23)
CALCIUM: 8.9 mg/dL (ref 8.4–10.5)
CHLORIDE: 105 meq/L (ref 96–112)
CO2: 23 mEq/L (ref 19–32)
Creatinine, Ser: 0.57 mg/dL (ref 0.50–1.10)
GFR calc non Af Amer: >90 mL/min (ref >90)
GLUCOSE: 99 mg/dL (ref 70–99)
Potassium: 3.6 mEq/L — ABNORMAL LOW (ref 3.7–5.3)
Sodium: 141 mEq/L (ref 137–147)
Total Bilirubin: <0.2 mg/dL — ABNORMAL LOW (ref 0.3–1.2)
Total Protein: 6.6 g/dL (ref 6.0–8.3)

## 2013-11-18 LAB — CBC
HEMATOCRIT: 33.4 % — AB (ref 36.0–46.0)
HEMOGLOBIN: 11.6 g/dL — AB (ref 12.0–15.0)
MCH: 30.3 pg (ref 26.0–34.0)
MCHC: 34.7 g/dL (ref 30.0–36.0)
MCV: 87.2 fL (ref 78.0–100.0)
PLATELETS: 152 10*3/uL (ref 150–400)
RBC: 3.83 MIL/uL — AB (ref 3.87–5.11)
RDW: 13.6 % (ref 11.5–15.5)
WBC: 6.8 10*3/uL (ref 4.0–10.5)

## 2013-11-18 MED ORDER — AZITHROMYCIN 250 MG PO TABS
1000.0000 mg | ORAL_TABLET | Freq: Every day | ORAL | Status: DC
  Administered 2013-11-18: 1000 mg via ORAL
  Filled 2013-11-18: qty 4

## 2013-11-18 NOTE — MAU Note (Signed)
PO fluids given

## 2013-11-18 NOTE — MAU Note (Signed)
Pt reports diarrhea since this am, states it has become bloody tonight. Pt reports she just returned from [place] last night.

## 2013-11-18 NOTE — MAU Provider Note (Signed)
History     CSN: [id number]  Arrival date and time: 11/18/13 0104   First Provider Initiated Contact with Patient 11/18/13 0136      Chief Complaint  Patient presents with  . Diarrhea   HPI [name] is a 28 y.o. G3P1011 at [redacted]w[redacted]d who presents to MAU today with complaint of bloody diarrhea. The patient was in [place] last week for a wedding and states that she returned last night. Symptoms started early yesterday morning and progressed from pink in stool to gross red blood in stool last night. She states diffuse abdominal cramping. She denies N/V or fever.   OB History   Grav Para Term Preterm Abortions TAB SAB Ect Mult Living   3 1 1  0 1 1 0 0  1      Past Medical History  Diagnosis Date  . Abnormal Pap smear   . Asthma   . Migraine     History reviewed. No pertinent past surgical history.  Family History  Problem Relation Age of Onset  . Heart disease Mother   . Alkaptonuria Son   . Heart disease Paternal Uncle   . Cancer Maternal Grandfather     History  Substance Use Topics  . Smoking status: Never Smoker   . Smokeless tobacco: Not on file  . Alcohol Use: No    Allergies:  Allergies  Allergen Reactions  . Pseudoephedrine     REACTION: SOB    Prescriptions prior to admission  Medication Sig Dispense Refill  . bismuth subsalicylate (PEPTO BISMOL) 262 MG/15ML suspension Take 30 mLs by mouth every 6 (six) hours as needed.      . Prenatal Vit-Fe Fumarate-FA (PRENATAL MULTIVITAMIN) TABS tablet Take 1 tablet by mouth daily at 12 noon.      . SUMAtriptan (IMITREX) 50 MG tablet May repeat in 2 hours if headache persists or recurs. Maximum of 200 mg in 24 hours.  10 tablet  0  . acetaminophen (TYLENOL) 500 MG tablet Take 1,000 mg by mouth every 6 (six) hours as needed.      Marland Kitchen albuterol (PROVENTIL HFA;VENTOLIN HFA) 108 (90 BASE) MCG/ACT inhaler Inhale 2 puffs into the lungs every 6 (six) hours as needed for shortness of breath (with exercise).  1 Inhaler   4    Review of Systems  Constitutional: Negative for fever and malaise/fatigue.  Gastrointestinal: Positive for abdominal pain, diarrhea and blood in stool. Negative for nausea, vomiting and constipation.  Genitourinary: Negative for dysuria, urgency and frequency.       Neg - vaginal bleeding  Neurological: Negative for dizziness and weakness.   Physical Exam   Blood pressure 123/61, pulse 98, temperature 98.7 F (37.1 C), temperature source Oral, resp. rate 18, height 5\' 2"  (1.575 m), weight 130 lb (58.968 kg), last menstrual period 07/15/2013, SpO2 100.00%.  Physical Exam  Constitutional: She is oriented to person, place, and time. She appears well-developed and well-nourished. No distress.  HENT:  Head: Normocephalic and atraumatic.  Cardiovascular: Normal rate, regular rhythm and normal heart sounds.   Respiratory: Effort normal and breath sounds normal. No respiratory distress.  GI: Soft. Bowel sounds are normal. She exhibits no distension and no mass. There is no tenderness. There is no rebound and no guarding.  Neurological: She is alert and oriented to person, place, and time.  Skin: Skin is warm and dry. No erythema.  Psychiatric: She has a normal mood and affect.   Results for orders placed during the hospital encounter  of 11/18/13 (from the past 24 hour(s))  URINALYSIS, ROUTINE W REFLEX MICROSCOPIC     Status: None   Collection Time    11/18/13  1:17 AM      Result Value Ref Range   Color, Urine YELLOW  YELLOW   APPearance CLEAR  CLEAR   Specific Gravity, Urine 1.025  1.005 - 1.030   pH 6.0  5.0 - 8.0   Glucose, UA NEGATIVE  NEGATIVE mg/dL   Hgb urine dipstick NEGATIVE  NEGATIVE   Bilirubin Urine NEGATIVE  NEGATIVE   Ketones, ur NEGATIVE  NEGATIVE mg/dL   Protein, ur NEGATIVE  NEGATIVE mg/dL   Urobilinogen, UA 0.2  0.0 - 1.0 mg/dL   Nitrite NEGATIVE  NEGATIVE   Leukocytes, UA NEGATIVE  NEGATIVE  CBC     Status: Abnormal   Collection Time    11/18/13  2:13 AM       Result Value Ref Range   WBC 6.8  4.0 - 10.5 K/uL   RBC 3.83 (*) 3.87 - 5.11 MIL/uL   Hemoglobin 11.6 (*) 12.0 - 15.0 g/dL   HCT 33.4 (*) 36.0 - 46.0 %   MCV 87.2  78.0 - 100.0 fL   MCH 30.3  26.0 - 34.0 pg   MCHC 34.7  30.0 - 36.0 g/dL   RDW 13.6  11.5 - 15.5 %   Platelets 152  150 - 400 K/uL  COMPREHENSIVE METABOLIC PANEL     Status: Abnormal   Collection Time    11/18/13  2:13 AM      Result Value Ref Range   Sodium 141  137 - 147 mEq/L   Potassium 3.6 (*) 3.7 - 5.3 mEq/L   Chloride 105  96 - 112 mEq/L   CO2 23  19 - 32 mEq/L   Glucose, Bld 99  70 - 99 mg/dL   BUN 10  6 - 23 mg/dL   Creatinine, Ser 0.57  0.50 - 1.10 mg/dL   Calcium 8.9  8.4 - 10.5 mg/dL   Total Protein 6.6  6.0 - 8.3 g/dL   Albumin 3.0 (*) 3.5 - 5.2 g/dL   AST 13  0 - 37 U/L   ALT 9  0 - 35 U/L   Alkaline Phosphatase 51  39 - 117 U/L   Total Bilirubin <0.2 (*) 0.3 - 1.2 mg/dL   GFR calc non Af Amer >90  >90 mL/min   GFR calc Af Amer >90  >90 mL/min    MAU Course  Procedures None  MDM UA, CBC and CMP today  Assessment and Plan  A: Traveler's diarrhea  P: Discharge home Patient treated with Zithromax in MAU Patient advised to use Pepto bismol as directed PRN BRAT diet information given Patient advised to increase PO hydration as tolerated Patient to follow-up with [facility] as scheduled for routine prenatal care Patient may return to MAU as needed or if her condition were to change or worsen  Farris Has, PA-C  11/18/2013, 3:13 AM

## 2013-11-18 NOTE — Discharge Instructions (Signed)
Diet for Diarrhea, Adult  Frequent, runny stools (diarrhea) may be caused or worsened by food or drink. Diarrhea may be relieved by changing your diet. Since diarrhea can last up to 7 days, it is easy for you to lose too much fluid from the body and become dehydrated. Fluids that are lost need to be replaced. Along with a modified diet, make sure you drink enough fluids to keep your urine clear or pale yellow.  DIET INSTRUCTIONS  · Ensure adequate fluid intake (hydration): have 1 cup (8 oz) of fluid for each diarrhea episode. Avoid fluids that contain simple sugars or sports drinks, fruit juices, whole milk products, and sodas. Your urine should be clear or pale yellow if you are drinking enough fluids. Hydrate with an oral rehydration solution that you can purchase at pharmacies, retail stores, and online. You can prepare an oral rehydration solution at home by mixing the following ingredients together:  ·   tsp table salt.  · ¾ tsp baking soda.  ·  tsp salt substitute containing potassium chloride.  · 1  tablespoons sugar.  · 1 L (34 oz) of water.  · Certain foods and beverages may increase the speed at which food moves through the gastrointestinal (GI) tract. These foods and beverages should be avoided and include:  · Caffeinated and alcoholic beverages.  · High-fiber foods, such as raw fruits and vegetables, nuts, seeds, and whole grain breads and cereals.  · Foods and beverages sweetened with sugar alcohols, such as xylitol, sorbitol, and mannitol.  · Some foods may be well tolerated and may help thicken stool including:  · Starchy foods, such as rice, toast, pasta, low-sugar cereal, oatmeal, grits, baked potatoes, crackers, and bagels.    · Bananas.    · Applesauce.  · Add probiotic-rich foods to help increase healthy bacteria in the GI tract, such as yogurt and fermented milk products.  RECOMMENDED FOODS AND BEVERAGES  Starches  Choose foods with less than 2 g of fiber per serving.  · Recommended:  White,  French, and pita breads, plain rolls, buns, bagels. Plain muffins, matzo. Soda, saltine, or graham crackers. Pretzels, melba toast, zwieback. Cooked cereals made with water: cornmeal, farina, cream cereals. Dry cereals: refined corn, wheat, rice. Potatoes prepared any way without skins, refined macaroni, spaghetti, noodles, refined rice.  · Avoid:  Bread, rolls, or crackers made with whole wheat, multi-grains, rye, bran seeds, nuts, or coconut. Corn tortillas or taco shells. Cereals containing whole grains, multi-grains, bran, coconut, nuts, raisins. Cooked or dry oatmeal. Coarse wheat cereals, granola. Cereals advertised as "high-fiber." Potato skins. Whole grain pasta, wild or brown rice. Popcorn. Sweet potatoes, yams. Sweet rolls, doughnuts, waffles, pancakes, sweet breads.  Vegetables  · Recommended: Strained tomato and vegetable juices. Most well-cooked and canned vegetables without seeds. Fresh: Tender lettuce, cucumber without the skin, cabbage, spinach, bean sprouts.  · Avoid: Fresh, cooked, or canned: Artichokes, baked beans, beet greens, broccoli, Brussels sprouts, corn, kale, legumes, peas, sweet potatoes. Cooked: Green or red cabbage, spinach. Avoid large servings of any vegetables because vegetables shrink when cooked, and they contain more fiber per serving than fresh vegetables.  Fruit  · Recommended: Cooked or canned: Apricots, applesauce, cantaloupe, cherries, fruit cocktail, grapefruit, grapes, kiwi, mandarin oranges, peaches, pears, plums, watermelon. Fresh: Apples without skin, ripe banana, grapes, cantaloupe, cherries, grapefruit, peaches, oranges, plums. Keep servings limited to ½ cup or 1 piece.  · Avoid: Fresh: Apples with skin, apricots, mangoes, pears, raspberries, strawberries. Prune juice, stewed or dried prunes. Dried   fruits, raisins, dates. Large servings of all fresh fruits.  Protein  · Recommended: Ground or well-cooked tender beef, ham, veal, lamb, pork, or poultry. Eggs. Fish,  oysters, shrimp, lobster, other seafoods. Liver, organ meats.  · Avoid: Tough, fibrous meats with gristle. Peanut butter, smooth or chunky. Cheese, nuts, seeds, legumes, dried peas, beans, lentils.  Dairy  · Recommended: Yogurt, lactose-free milk, kefir, drinkable yogurt, buttermilk, soy milk, or plain hard cheese.  · Avoid: Milk, chocolate milk, beverages made with milk, such as milkshakes.  Soups  · Recommended: Bouillon, broth, or soups made from allowed foods. Any strained soup.  · Avoid: Soups made from vegetables that are not allowed, cream or milk-based soups.  Desserts and Sweets  · Recommended: Sugar-free gelatin, sugar-free frozen ice pops made without sugar alcohol.  · Avoid: Plain cakes and cookies, pie made with fruit, pudding, custard, cream pie. Gelatin, fruit, ice, sherbet, frozen ice pops. Ice cream, ice milk without nuts. Plain hard candy, honey, jelly, molasses, syrup, sugar, chocolate syrup, gumdrops, marshmallows.  Fats and Oils  · Recommended: Limit fats to less than 8 tsp per day.  · Avoid: Seeds, nuts, olives, avocados. Margarine, butter, cream, mayonnaise, salad oils, plain salad dressings. Plain gravy, crisp bacon without rind.  Beverages  · Recommended: Water, decaffeinated teas, oral rehydration solutions, sugar-free beverages not sweetened with sugar alcohols.  · Avoid: Fruit juices, caffeinated beverages (coffee, tea, soda), alcohol, sports drinks, or lemon-lime soda.  Condiments  · Recommended: Ketchup, mustard, horseradish, vinegar, cocoa powder. Spices in moderation: allspice, basil, bay leaves, celery powder or leaves, cinnamon, cumin powder, curry powder, ginger, mace, marjoram, onion or garlic powder, oregano, paprika, parsley flakes, ground pepper, rosemary, sage, savory, tarragon, thyme, turmeric.  · Avoid: Coconut, honey.  Document Released: 11/18/2003 Document Revised: 05/22/2012 Document Reviewed: 01/12/2012  [employer]® Patient Information ©2014 [employer].

## 2013-11-19 ENCOUNTER — Telehealth: Payer: Self-pay | Admitting: *Deleted

## 2013-11-19 NOTE — Telephone Encounter (Signed)
Message copied by Erik Obey on Wed Nov 19, 2013  2:49 PM ------      Message from: Joelyn Oms      Created: Sun Nov 16, 2013 11:01 AM      Regarding: Migraine Appt with Vaughan Basta       Is there any way we can get this pt in for evaluations of her migraines?  We're not doing them at [facility] yet.            NOBSJGG ------

## 2013-11-19 NOTE — Telephone Encounter (Signed)
Left message for patient to call our office in order to set up an appointment with Monna Fam for migraine headache consult.

## 2013-11-20 ENCOUNTER — Ambulatory Visit (HOSPITAL_COMMUNITY)
Admission: RE | Admit: 2013-11-20 | Discharge: 2013-11-20 | Disposition: A | Discharge status: Home / Self Care | Payer: Medicaid Other | Source: Ambulatory Visit | Attending: Family Medicine | Admitting: Family Medicine

## 2013-11-20 ENCOUNTER — Other Ambulatory Visit: Payer: Self-pay | Admitting: Family Medicine

## 2013-11-20 DIAGNOSIS — O0001 Abdominal pregnancy with intrauterine pregnancy: Secondary | ICD-10-CM

## 2013-11-20 DIAGNOSIS — Z3689 Encounter for other specified antenatal screening: Secondary | ICD-10-CM | POA: Insufficient documentation

## 2013-11-20 DIAGNOSIS — O9989 Other specified diseases and conditions complicating pregnancy, childbirth and the puerperium: Principal | ICD-10-CM

## 2013-11-20 DIAGNOSIS — O99891 Other specified diseases and conditions complicating pregnancy: Secondary | ICD-10-CM | POA: Insufficient documentation

## 2013-11-20 DIAGNOSIS — J45909 Unspecified asthma, uncomplicated: Secondary | ICD-10-CM | POA: Insufficient documentation

## 2013-12-05 ENCOUNTER — Encounter: Payer: Medicaid Other | Admitting: Family Medicine

## 2013-12-09 ENCOUNTER — Encounter: Payer: Self-pay | Admitting: Nurse Practitioner

## 2013-12-09 ENCOUNTER — Ambulatory Visit (INDEPENDENT_AMBULATORY_CARE_PROVIDER_SITE_OTHER): Payer: Medicaid Other | Admitting: Nurse Practitioner

## 2013-12-09 VITALS — BP 118/62 | HR 80 | Ht 62.0 in | Wt 138.0 lb

## 2013-12-09 DIAGNOSIS — G43009 Migraine without aura, not intractable, without status migrainosus: Secondary | ICD-10-CM

## 2013-12-09 DIAGNOSIS — G43709 Chronic migraine without aura, not intractable, without status migrainosus: Secondary | ICD-10-CM | POA: Insufficient documentation

## 2013-12-09 MED ORDER — ACETAMINOPHEN-CODEINE #3 300-30 MG PO TABS: 1.0000 | ORAL_TABLET | ORAL | Status: DC | PRN

## 2013-12-09 MED ORDER — PROMETHAZINE HCL 25 MG PO TABS: 25.0000 mg | ORAL_TABLET | Freq: Four times a day (QID) | ORAL | Status: DC | PRN

## 2013-12-09 MED ORDER — SUMATRIPTAN SUCCINATE 100 MG PO TABS: 100.0000 mg | ORAL_TABLET | Freq: Once | ORAL | Status: DC | PRN

## 2013-12-09 NOTE — Progress Notes (Signed)
Diagnosis: Migraine without aura  History: [place] 28 y.o. [phone] comes to [facility] office for migraine evaluation. Pt has had migraines since [employer]. Her sister and grandmother have migraines. The migraines have become worse since this planned pregnancy. She is getting her prenatal care at [name]. She ended up in ER at [facility] for a 5 day migraine and was given flexeril and Imitrex 50 mg. The Imitrex helps somewhat though not totally relieving migraine. She does not want to take daily medication, she feels the headaches are actually getting a little better.  She is currently in school for [employer] and has a 44 year old son. Her FOB works late most nights. She denies any other stressors.  She had difficulty falling asleep and maintaining sleep.   Location: left and right temples  Number of Headache days/month: almost daily Severe:2 Moderate:20 Mild:0  Current Outpatient Prescriptions on File Prior to Visit  Medication Sig Dispense Refill  . Prenatal Vit-Fe Fumarate-FA (PRENATAL MULTIVITAMIN) TABS tablet Take 1 tablet by mouth daily at 12 noon.      Marland Kitchen acetaminophen (TYLENOL) 500 MG tablet Take 1,000 mg by mouth every 6 (six) hours as needed.      Marland Kitchen albuterol (PROVENTIL HFA;VENTOLIN HFA) 108 (90 BASE) MCG/ACT inhaler Inhale 2 puffs into the lungs every 6 (six) hours as needed for shortness of breath (with exercise).  1 Inhaler  4   No current facility-administered medications on file prior to visit.    Acute / prevention: Imitrex, Flexeril, tylenol  Past Medical History  Diagnosis Date  . Abnormal Pap smear   . Asthma   . Migraine    History reviewed. No pertinent past surgical history. Family History  Problem Relation Age of Onset  . Heart disease Mother   . Alkaptonuria Son   . Heart disease Paternal Uncle   . Cancer Maternal Grandfather    Social History:  reports that she has never smoked. She does not have any smokeless tobacco history  on file. She reports that she does not drink alcohol or use illicit drugs. Allergies:  Allergies  Allergen Reactions  . Pseudoephedrine     REACTION: SOB    Triggers: stress  Birth control: pregnant  ROS: positive for migraines, sleep issues, asthma, allergies  Exam: Well developed, well nourished caucasian female  General:NAD HEENT:negative Cardiac:RRR Lungs:Clear Neuro:negative Skin:warm and dry  Impression:migraine - common  Plan: Discussed the pathophysiology of migraine and the risk benefits of medications in pregnancy. We will change Imitrex to 100 mg tabs, add tylenol # 3 and phenergan. She is asked to be aggressive with the onset of migraine. She declines any daily prevention or Trigger point injections. I expect she has underlying anxiety that is untreated. If her migraines do not improve and respond to aggressive treatment she will come back and we will revisit prevention.   Time Spent:45 min

## 2013-12-10 ENCOUNTER — Ambulatory Visit (INDEPENDENT_AMBULATORY_CARE_PROVIDER_SITE_OTHER): Payer: Medicaid Other | Admitting: Family Medicine

## 2013-12-10 VITALS — BP 104/79 | Temp 98.2°F | Wt 138.2 lb

## 2013-12-10 DIAGNOSIS — Z3480 Encounter for supervision of other normal pregnancy, unspecified trimester: Secondary | ICD-10-CM

## 2013-12-10 NOTE — Patient Instructions (Signed)
It was nice to meet you! Everything looks like it's doing well. Please return in 4 weeks.   Prenatal Care  WHAT IS PRENATAL CARE?  Prenatal care means health care during your pregnancy, before your baby is born. It is very important to take care of yourself and your baby during your pregnancy by:   Getting early prenatal care. If you know you are pregnant, or think you might be pregnant, call your health care provider as soon as possible. Schedule a visit for a prenatal exam.  Getting regular prenatal care. Follow your health care provider's schedule for blood and other necessary tests. Do not miss appointments.  Doing everything you can to keep yourself and your baby healthy during your pregnancy.  Getting complete care. Prenatal care should include evaluation of the medical, dietary, educational, psychological, and social needs of you and your significant other. The medical and genetic history of your family and the family of your baby's father should be discussed with your health care provider.  Discussing with your health care provider:  Prescription, over-the-counter, and herbal medicines that you take.  Any history of substance abuse, alcohol use, smoking, and illegal drug use.  Any history of domestic abuse and violence.  Immunizations you have received.  Your nutrition and diet.  The amount of exercise you do.  Any environmental and occupational hazards to which you are exposed.  History of sexually transmitted infections for both you and your partner.  Previous pregnancies you have had. WHY IS PRENATAL CARE SO IMPORTANT?  By regularly seeing your health care provider, you help ensure that problems can be identified early so that they can be treated as soon as possible. Other problems might be prevented. Many studies have shown that early and regular prenatal care is important for the health of mothers and their babies.  HOW CAN I TAKE CARE OF MYSELF WHILE I AM PREGNANT?   Here are ways to take care of yourself and your baby:   Start or continue taking your multivitamin with 400 micrograms (mcg) of folic acid every day.  Get early and regular prenatal care. It is very important to see a health care provider during your pregnancy. Your health care provider will check at each visit to make sure that you and the baby are healthy. If there are any problems, action can be taken right away to help you and the baby.  Eat a healthy diet that includes:  Fruits.  Vegetables.  Foods low in saturated fat.  Whole grains.  Calcium-rich foods, such as milk, yogurt, and hard cheeses.  Drink 6 to 8 glasses of liquids a day.  Unless your health care provider tells you not to, try to be physically active for 30 minutes, most days of the week. If you are pressed for time, you can get your activity in through 10-minute segments, three times a day.  Do not smoke, drink alcohol, or use drugs. These can cause long-term damage to your baby. Talk with your health care provider about steps to take to stop smoking. Talk with a member of your faith community, a counselor, a trusted friend, or your health care provider if you are concerned about your alcohol or drug use.  Ask your health care provider before taking any medicine, even over-the-counter medicines. Some medicines are not safe to take during pregnancy.  Get plenty of rest and sleep.  Avoid hot tubs and saunas during pregnancy.  Do not have X-rays taken unless absolutely necessary and with  the recommendation of your health care provider. A lead shield can be placed on your abdomen to protect the baby when X-rays are taken in other parts of the body.  Do not empty the cat litter when you are pregnant. It may contain a parasite that causes an infection called toxoplasmosis, which can cause birth defects. Also, use gloves when working in garden areas used by cats.  Do not eat uncooked or undercooked meats or fish.  Do  not eat soft, mold-ripened cheeses (Brie, Camembert, and chevre) or soft, blue-veined cheese (Danish blue and Roquefort).  Stay away from toxic chemicals like:  Insecticides.  Solvents (some cleaners or paint thinners).  Lead.  Mercury.  Sexual intercourse may continue until the end of the pregnancy, unless you have a medical problem or there is a problem with the pregnancy and your health care provider tells you not to.  Do not wear high-heel shoes, especially during the second half of the pregnancy. You can lose your balance and fall.  Do not take long trips, unless absolutely necessary. Be sure to see your health care provider before going on the trip.  Do not sit in one position for more than 2 hours when on a trip.  Take a copy of your medical records when going on a trip. Know where a hospital is located in the city you are visiting, in case of an emergency.  Most dangerous household products will have pregnancy warnings on their labels. Ask your health care provider about products if you are unsure.  Limit or eliminate your caffeine intake from coffee, tea, sodas, medicines, and chocolate.  Many women continue working through pregnancy. Staying active might help you stay healthier. If you have a question about the safety or the hours you work at your particular job, talk with your health care provider.  Get informed:  Read books.  Watch videos.  Go to childbirth classes for you and your significant other.  Talk with experienced moms.  Ask your health care provider about childbirth education classes for you and your partner. Classes can help you and your partner prepare for the birth of your baby.  Ask about a baby doctor (pediatrician) and methods and pain medicine for labor, delivery, and possible cesarean delivery. HOW OFTEN SHOULD I SEE MY HEALTH CARE PROVIDER DURING PREGNANCY?  Your health care provider will give you a schedule for your prenatal visits. You will  have visits more often as you get closer to the end of your pregnancy. An average pregnancy lasts about 40 weeks.  A typical schedule includes visiting your health care provider:   About once each month during your first 6 months of pregnancy.  Every 2 weeks during the next 2 months.  Weekly in the last month, until the delivery date. Your health care provider will probably want to see you more often if:  You are older than 35 years.  Your pregnancy is high risk because you have certain health problems or problems with the pregnancy, such as:  Diabetes.  High blood pressure.  The baby is not growing on schedule, according to the dates of the pregnancy. Your health care provider will do special tests to make sure you and the baby are not having any serious problems. WHAT HAPPENS DURING PRENATAL VISITS?   At your first prenatal visit, your health care provider will do a physical exam and talk to you about your health history and the health history of your partner and your family. Your  health care provider will be able to tell you what date to expect your baby to be born on.  Your first physical exam will include checks of your blood pressure, measurements of your height and weight, and an exam of your pelvic organs. Your health care provider will do a Pap test if you have not had one recently and will do cultures of your cervix to make sure there is no infection.  At each prenatal visit, there will be tests of your blood, urine, blood pressure, weight, and checking the progress of the baby.  At your later prenatal visits, your health care provider will check how you are doing and how the baby is developing. You may have a number of tests done as your pregnancy progresses.  Ultrasound exams are often used to check on the baby's growth and health.  You may have more urine and blood tests, as well as special tests, if needed. These may include amniocentesis to examine fluid in the  pregnancy sac, stress tests to check how the baby responds to contractions, or a biophysical profile to measure fetus well-being. Your health care provider will explain the tests and why they are necessary.  You should discuss with your health care provider your plans to breastfeed or bottle-feed your baby.  Each visit is also a chance for you to learn about staying healthy during pregnancy and to ask questions. Document Released: 08/31/2003 Document Revised: 06/18/2013 Document Reviewed: 02/13/2013 [facility] Patient Information 2014 [place], [place].

## 2013-12-10 NOTE — Progress Notes (Signed)
Alejandra Conway [name] is a 28 y.o. G3P1011 at [redacted]w[redacted]d presenting for pre-natal visit. She has received good PNC thus far and reports no complications with this or previous pregnancies. She denies contractions, LOF, VB/discharge, and reports good fetal movement. She has had migraines more frequently while pregnant and saw a migraine specialist at [facility] hospital who increased her imitrex dose to 100mg .   Filed Vitals:   12/10/13 1556  BP: 104/79  Temp: 98.2 F (36.8 C)  Weight: 138 lb 4 oz (62.71 kg)   LMP 07/15/2013 Abd gravid to umbilicus, soft, NT. FHTs 150  A/P:  Follow up 4 weeks Needs pap smear U/S at [redacted]w[redacted]d with normal female anatomy, anterior placenta without previa Discussed desire for breast feeding and currently considering POP for contraception.

## 2014-01-13 ENCOUNTER — Ambulatory Visit (INDEPENDENT_AMBULATORY_CARE_PROVIDER_SITE_OTHER): Payer: Medicaid Other | Admitting: Family Medicine

## 2014-01-13 VITALS — BP 114/60 | HR 93 | Wt 146.0 lb

## 2014-01-13 DIAGNOSIS — Z3480 Encounter for supervision of other normal pregnancy, unspecified trimester: Secondary | ICD-10-CM

## 2014-01-13 NOTE — Patient Instructions (Signed)
Third Trimester of Pregnancy The third trimester is from week 29 through week 42, months 7 through 9. The third trimester is a time when the fetus is growing rapidly. At the end of the ninth month, the fetus is about 20 inches in length and weighs 6 10 pounds.  BODY CHANGES Your body goes through many changes during pregnancy. The changes vary from woman to woman.   Your weight will continue to increase. You can expect to gain 25 35 pounds (11 16 kg) by the end of the pregnancy.  You may begin to get stretch marks on your hips, abdomen, and breasts.  You may urinate more often because the fetus is moving lower into your pelvis and pressing on your bladder.  You may develop or continue to have heartburn as a result of your pregnancy.  You may develop constipation because certain hormones are causing the muscles that push waste through your intestines to slow down.  You may develop hemorrhoids or swollen, bulging veins (varicose veins).  You may have pelvic pain because of the weight gain and pregnancy hormones relaxing your joints between the bones in your pelvis. Back aches may result from over exertion of the muscles supporting your posture.  Your breasts will continue to grow and be tender. A yellow discharge may leak from your breasts called colostrum.  Your belly button may stick out.  You may feel short of breath because of your expanding uterus.  You may notice the fetus "dropping," or moving lower in your abdomen.  You may have a bloody mucus discharge. This usually occurs a few days to a week before labor begins.  Your cervix becomes thin and soft (effaced) near your due date. WHAT TO EXPECT AT YOUR PRENATAL EXAMS  You will have prenatal exams every 2 weeks until week 36. Then, you will have weekly prenatal exams. During a routine prenatal visit:  You will be weighed to make sure you and the fetus are growing normally.  Your blood pressure is taken.  Your abdomen will be  measured to track your baby's growth.  The fetal heartbeat will be listened to.  Any test results from the previous visit will be discussed.  You may have a cervical check near your due date to see if you have effaced. At around 36 weeks, your caregiver will check your cervix. At the same time, your caregiver will also perform a test on the secretions of the vaginal tissue. This test is to determine if a type of bacteria, Group B streptococcus, is present. Your caregiver will explain this further. Your caregiver may ask you:  What your birth plan is.  How you are feeling.  If you are feeling the baby move.  If you have had any abnormal symptoms, such as leaking fluid, bleeding, severe headaches, or abdominal cramping.  If you have any questions. Other tests or screenings that may be performed during your third trimester include:  Blood tests that check for low iron levels (anemia).  Fetal testing to check the health, activity level, and growth of the fetus. Testing is done if you have certain medical conditions or if there are problems during the pregnancy. FALSE LABOR You may feel small, irregular contractions that eventually go away. These are called Braxton Hicks contractions, or false labor. Contractions may last for hours, days, or even weeks before true labor sets in. If contractions come at regular intervals, intensify, or become painful, it is best to be seen by your caregiver.  SIGNS OF LABOR   Menstrual-like cramps.  Contractions that are 5 minutes apart or less.  Contractions that start on the top of the uterus and spread down to the lower abdomen and back.  A sense of increased pelvic pressure or back pain.  A watery or bloody mucus discharge that comes from the vagina. If you have any of these signs before the 37th week of pregnancy, call your caregiver right away. You need to go to the hospital to get checked immediately. HOME CARE INSTRUCTIONS   Avoid all  smoking, herbs, alcohol, and unprescribed drugs. These chemicals affect the formation and growth of the baby.  Follow your caregiver's instructions regarding medicine use. There are medicines that are either safe or unsafe to take during pregnancy.  Exercise only as directed by your caregiver. Experiencing uterine cramps is a good sign to stop exercising.  Continue to eat regular, healthy meals.  Wear a good support bra for breast tenderness.  Do not use hot tubs, steam rooms, or saunas.  Wear your seat belt at all times when driving.  Avoid raw meat, uncooked cheese, cat litter boxes, and soil used by cats. These carry germs that can cause birth defects in the baby.  Take your prenatal vitamins.  Try taking a stool softener (if your caregiver approves) if you develop constipation. Eat more high-fiber foods, such as fresh vegetables or fruit and whole grains. Drink plenty of fluids to keep your urine clear or pale yellow.  Take warm sitz baths to soothe any pain or discomfort caused by hemorrhoids. Use hemorrhoid cream if your caregiver approves.  If you develop varicose veins, wear support hose. Elevate your feet for 15 minutes, 3 4 times a day. Limit salt in your diet.  Avoid heavy lifting, wear low heal shoes, and practice good posture.  Rest a lot with your legs elevated if you have leg cramps or low back pain.  Visit your dentist if you have not gone during your pregnancy. Use a soft toothbrush to brush your teeth and be gentle when you floss.  A sexual relationship may be continued unless your caregiver directs you otherwise.  Do not travel far distances unless it is absolutely necessary and only with the approval of your caregiver.  Take prenatal classes to understand, practice, and ask questions about the labor and delivery.  Make a trial run to the hospital.  Pack your hospital bag.  Prepare the baby's nursery.  Continue to go to all your prenatal visits as directed  by your caregiver. SEEK MEDICAL CARE IF:  You are unsure if you are in labor or if your water has broken.  You have dizziness.  You have mild pelvic cramps, pelvic pressure, or nagging pain in your abdominal area.  You have persistent nausea, vomiting, or diarrhea.  You have a bad smelling vaginal discharge.  You have pain with urination. SEEK IMMEDIATE MEDICAL CARE IF:   You have a fever.  You are leaking fluid from your vagina.  You have spotting or bleeding from your vagina.  You have severe abdominal cramping or pain.  You have rapid weight loss or gain.  You have shortness of breath with chest pain.  You notice sudden or extreme swelling of your face, hands, ankles, feet, or legs.  You have not felt your baby move in over an hour.  You have severe headaches that do not go away with medicine.  You have vision changes. Document Released: 08/22/2001 Document Revised: 04/30/2013 Document Reviewed:   You have severe abdominal cramping or pain.   You have rapid weight loss or gain.   You have shortness of breath with chest pain.   You notice sudden or extreme swelling of your face, hands, ankles, feet, or legs.   You have not felt your baby move in over an hour.   You have severe headaches that do not go away with medicine.   You have vision changes.  Document Released: 08/22/2001 Document Revised: 04/30/2013 Document Reviewed: 10/29/2012  [employer] Patient Information 2014 [employer].

## 2014-01-13 NOTE — Progress Notes (Signed)
Alejandra Conway is a 28 y.o. G3P1011 at [redacted]w[redacted]d today. Complains of pubic pressure for the past month. She states it is worse mid-day and night time discomfort. Her migraine headaches have eased, she has seen the headache specialist in [facility]. She is prescribed Imitrex, Zofran and tylenol with codeine. No leaking, gush of fluids, vaginal bleeding or contractions. Taking prenatal vitamins. [place] with female baby Alejandra Conway).  O:  BP 114/60  Pulse 93  Wt 146 lb (66.225 kg)  LMP 07/15/2013 Abd: gravid Ext:  No edema   A/P: FHR: 145, good fetal movement appreciated Next appt: GTT, repeat labs and PHQ to be completed. Female infant per [place] - desires circumcision.  BF preferred , BCP after delivery F/U 2 weeks

## 2014-01-29 ENCOUNTER — Other Ambulatory Visit: Payer: Medicaid Other

## 2014-01-29 ENCOUNTER — Ambulatory Visit (INDEPENDENT_AMBULATORY_CARE_PROVIDER_SITE_OTHER): Payer: Medicaid Other | Admitting: Family Medicine

## 2014-01-29 VITALS — BP 106/71 | HR 93 | Temp 97.7°F | Wt 148.0 lb

## 2014-01-29 DIAGNOSIS — Z3480 Encounter for supervision of other normal pregnancy, unspecified trimester: Secondary | ICD-10-CM

## 2014-01-29 LAB — CBC
HCT: 34.2 % — ABNORMAL LOW (ref 36.0–46.0)
HEMOGLOBIN: 11.9 g/dL — AB (ref 12.0–15.0)
MCH: 30.4 pg (ref 26.0–34.0)
MCHC: 34.8 g/dL (ref 30.0–36.0)
MCV: 87.5 fL (ref 78.0–100.0)
Platelets: 171 10*3/uL (ref 150–400)
RBC: 3.91 MIL/uL (ref 3.87–5.11)
RDW: 14 % (ref 11.5–15.5)
WBC: 6.7 10*3/uL (ref 4.0–10.5)

## 2014-01-29 LAB — GLUCOSE, CAPILLARY
Comment 1: 1
GLUCOSE-CAPILLARY: 103 mg/dL — AB (ref 70–99)

## 2014-01-29 LAB — RPR

## 2014-01-29 LAB — HIV ANTIBODY (ROUTINE TESTING W REFLEX): HIV: NONREACTIVE

## 2014-01-29 NOTE — Progress Notes (Signed)
Alejandra Conway Alejandra Conway is a 28 y.o. female G34P1011 [redacted]w[redacted]d today, with no complaints. She denies LOF, vaginal bleeding or contractions. Her ligament pain is improved. Her headaches have improved as well. [place] and her son are present for exam today. Pt has questions about induction of labor. She starts school 1 week after babies due date and is wondering if she can be induced during that week if she does not go on due date.   O: BP 106/71  Pulse 93  Temp(Src) 97.7 F (36.5 C)  Wt 148 lb (67.132 kg)  LMP 07/15/2013 Abd: gravid.  Ext: trace edema  A/P:  - good fetal heart rate: 135. Good movement noted. - female baby; circumcision desired. - Family is undecided with a pediatrician. 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 I have asked her and [name] to make this decision by 36 week, just in case she delivers early. - Patient received her GTT today. - Patient received her third trimester labs. - Mother plans to breast-feed although it sounds like she has concerns that she will not be able to do so since she was unable to breast-feed her first child due to lack of milk production. She plans to use birth control pills for contraception. - Preterm labor and fetal kick counts were discussed today.  - Patient to follow-up in 2 weeks. PHQ to be completed on next visit.

## 2014-01-29 NOTE — Patient Instructions (Signed)
Labor Induction  Labor induction is when steps are taken to cause a pregnant woman to begin the labor process. Most women go into labor on their own between 37 weeks and 42 weeks of the pregnancy. When this does not happen or when there is a medical need, methods may be used to induce labor. Labor induction causes a pregnant woman's uterus to contract. It also causes the cervix to soften (ripen), open (dilate), and thin out (efface). Usually, labor is not induced before 39 weeks of the pregnancy unless there is a problem with the baby or mother.  Before inducing labor, your health care provider will consider a number of factors, including the following:  The medical condition of you and the baby.   How many weeks along you are.   The status of the baby's lung maturity.   The condition of the cervix.   The position of the baby.  WHAT ARE THE REASONS FOR LABOR INDUCTION? Labor may be induced for the following reasons:  The health of the baby or mother is at risk.   The pregnancy is overdue by 1 week or more.   The water breaks but labor does not start on its own.   The mother has a health condition or serious illness, such as high blood pressure, infection, placental abruption, or diabetes.  The amniotic fluid amounts are low around the baby.   The baby is distressed.  Convenience or wanting the baby to be born on a certain date is not a reason for inducing labor. WHAT METHODS ARE USED FOR LABOR INDUCTION? Several methods of labor induction may be used, such as:   Prostaglandin medicine. This medicine causes the cervix to dilate and ripen. The medicine will also start contractions. It can be taken by mouth or by inserting a suppository into the vagina.   Inserting a thin tube (catheter) with a balloon on the end into the vagina to dilate the cervix. Once inserted, the balloon is expanded with water, which causes the cervix to open.   Stripping the membranes. Your health  care provider separates amniotic sac tissue from the cervix, causing the cervix to be stretched and causing the release of a hormone called progesterone. This may cause the uterus to contract. It is often done during an office visit. You will be sent home to wait for the contractions to begin. You will then come in for an induction.   Breaking the water. Your health care provider makes a hole in the amniotic sac using a small instrument. Once the amniotic sac breaks, contractions should begin. This may still take hours to see an effect.   Medicine to trigger or strengthen contractions. This medicine is given through an IV access tube inserted into a vein in your arm.  All of the methods of induction, besides stripping the membranes, will be done in the hospital. Induction is done in the hospital so that you and the baby can be carefully monitored.  HOW LONG DOES IT TAKE FOR LABOR TO BE INDUCED? Some inductions can take up to 2 3 days. Depending on the cervix, it usually takes less time. It takes longer when you are induced early in the pregnancy or if this is your first pregnancy. If a mother is still pregnant and the induction has been going on for 2 3 days, either the mother will be sent home or a cesarean delivery will be needed. WHAT ARE THE RISKS ASSOCIATED WITH LABOR INDUCTION? Some of the risks   of induction include:   Changes in fetal heart rate, such as too high, too low, or erratic.   Fetal distress.   Chance of infection for the mother and baby.   Increased chance of having a cesarean delivery.   Breaking off (abruption) of the placenta from the uterus (rare).   Uterine rupture (very rare).  When induction is needed for medical reasons, the benefits of induction may outweigh the risks. WHAT ARE SOME REASONS FOR NOT INDUCING LABOR? Labor induction should not be done if:   It is shown that your baby does not tolerate labor.   You have had previous surgeries on your  uterus, such as a myomectomy or the removal of fibroids.   Your placenta lies very low in the uterus and blocks the opening of the cervix (placenta previa).   Your baby is not in a head-down position.   The umbilical cord drops down into the birth canal in front of the baby. This could cut off the baby's blood and oxygen supply.   You have had a previous cesarean delivery.   There are unusual circumstances, such as the baby being extremely premature.  Document Released: 01/17/2007 Document Revised: 04/30/2013 Document Reviewed: 03/27/2013 [employer] Patient Information 2014 [employer].  

## 2014-02-13 ENCOUNTER — Ambulatory Visit (INDEPENDENT_AMBULATORY_CARE_PROVIDER_SITE_OTHER): Payer: Medicaid Other | Admitting: Family Medicine

## 2014-02-13 VITALS — BP 108/49 | HR 99 | Temp 98.7°F | Wt 155.0 lb

## 2014-02-13 DIAGNOSIS — Z23 Encounter for immunization: Secondary | ICD-10-CM

## 2014-02-13 MED ORDER — TETANUS-DIPHTH-ACELL PERTUSSIS 5-2.5-18.5 LF-MCG/0.5 IM SUSP: 0.5000 mL | Freq: Once | INTRAMUSCULAR | Status: DC

## 2014-02-13 NOTE — Patient Instructions (Addendum)
2 weeks next appt  tetanus today  Third Trimester of Pregnancy The third trimester is from week 29 through week 42, months 7 through 9. The third trimester is a time when the fetus is growing rapidly. At the end of the ninth month, the fetus is about 20 inches in length and weighs 6 10 pounds.  BODY CHANGES Your body goes through many changes during pregnancy. The changes vary from woman to woman.   Your weight will continue to increase. You can expect to gain 25 35 pounds (11 16 kg) by the end of the pregnancy.  You may begin to get stretch marks on your hips, abdomen, and breasts.  You may urinate more often because the fetus is moving lower into your pelvis and pressing on your bladder.  You may develop or continue to have heartburn as a result of your pregnancy.  You may develop constipation because certain hormones are causing the muscles that push waste through your intestines to slow down.  You may develop hemorrhoids or swollen, bulging veins (varicose veins).  You may have pelvic pain because of the weight gain and pregnancy hormones relaxing your joints between the bones in your pelvis. Back aches may result from over exertion of the muscles supporting your posture.  Your breasts will continue to grow and be tender. A yellow discharge may leak from your breasts called colostrum.  Your belly button may stick out.  You may feel short of breath because of your expanding uterus.  You may notice the fetus "dropping," or moving lower in your abdomen.  You may have a bloody mucus discharge. This usually occurs a few days to a week before labor begins.  Your cervix becomes thin and soft (effaced) near your due date. WHAT TO EXPECT AT YOUR PRENATAL EXAMS  You will have prenatal exams every 2 weeks until week 36. Then, you will have weekly prenatal exams. During a routine prenatal visit:  You will be weighed to make sure you and the fetus are growing normally.  Your blood  pressure is taken.  Your abdomen will be measured to track your baby's growth.  The fetal heartbeat will be listened to.  Any test results from the previous visit will be discussed.  You may have a cervical check near your due date to see if you have effaced. At around 36 weeks, your caregiver will check your cervix. At the same time, your caregiver will also perform a test on the secretions of the vaginal tissue. This test is to determine if a type of bacteria, Group B streptococcus, is present. Your caregiver will explain this further. Your caregiver may ask you:  What your birth plan is.  How you are feeling.  If you are feeling the baby move.  If you have had any abnormal symptoms, such as leaking fluid, bleeding, severe headaches, or abdominal cramping.  If you have any questions. Other tests or screenings that may be performed during your third trimester include:  Blood tests that check for low iron levels (anemia).  Fetal testing to check the health, activity level, and growth of the fetus. Testing is done if you have certain medical conditions or if there are problems during the pregnancy. FALSE LABOR You may feel small, irregular contractions that eventually go away. These are called Braxton Hicks contractions, or false labor. Contractions may last for hours, days, or even weeks before true labor sets in. If contractions come at regular intervals, intensify, or become painful, it is  best to be seen by your caregiver.  SIGNS OF LABOR   Menstrual-like cramps.  Contractions that are 5 minutes apart or less.  Contractions that start on the top of the uterus and spread down to the lower abdomen and back.  A sense of increased pelvic pressure or back pain.  A watery or bloody mucus discharge that comes from the vagina. If you have any of these signs before the 37th week of pregnancy, call your caregiver right away. You need to go to the hospital to get checked  immediately. HOME CARE INSTRUCTIONS   Avoid all smoking, herbs, alcohol, and unprescribed drugs. These chemicals affect the formation and growth of the baby.  Follow your caregiver's instructions regarding medicine use. There are medicines that are either safe or unsafe to take during pregnancy.  Exercise only as directed by your caregiver. Experiencing uterine cramps is a good sign to stop exercising.  Continue to eat regular, healthy meals.  Wear a good support bra for breast tenderness.  Do not use hot tubs, steam rooms, or saunas.  Wear your seat belt at all times when driving.  Avoid raw meat, uncooked cheese, cat litter boxes, and soil used by cats. These carry germs that can cause birth defects in the baby.  Take your prenatal vitamins.  Try taking a stool softener (if your caregiver approves) if you develop constipation. Eat more high-fiber foods, such as fresh vegetables or fruit and whole grains. Drink plenty of fluids to keep your urine clear or pale yellow.  Take warm sitz baths to soothe any pain or discomfort caused by hemorrhoids. Use hemorrhoid cream if your caregiver approves.  If you develop varicose veins, wear support hose. Elevate your feet for 15 minutes, 3 4 times a day. Limit salt in your diet.  Avoid heavy lifting, wear low heal shoes, and practice good posture.  Rest a lot with your legs elevated if you have leg cramps or low back pain.  Visit your dentist if you have not gone during your pregnancy. Use a soft toothbrush to brush your teeth and be gentle when you floss.  A sexual relationship may be continued unless your caregiver directs you otherwise.  Do not travel far distances unless it is absolutely necessary and only with the approval of your caregiver.  Take prenatal classes to understand, practice, and ask questions about the labor and delivery.  Make a trial run to the hospital.  Pack your hospital bag.  Prepare the baby's  nursery.  Continue to go to all your prenatal visits as directed by your caregiver. SEEK MEDICAL CARE IF:  You are unsure if you are in labor or if your water has broken.  You have dizziness.  You have mild pelvic cramps, pelvic pressure, or nagging pain in your abdominal area.  You have persistent nausea, vomiting, or diarrhea.  You have a bad smelling vaginal discharge.  You have pain with urination. SEEK IMMEDIATE MEDICAL CARE IF:   You have a fever.  You are leaking fluid from your vagina.  You have spotting or bleeding from your vagina.  You have severe abdominal cramping or pain.  You have rapid weight loss or gain.  You have shortness of breath with chest pain.  You notice sudden or extreme swelling of your face, hands, ankles, feet, or legs.  You have not felt your baby move in over an hour.  You have severe headaches that do not go away with medicine.  You have vision changes.  Document Released: 08/22/2001 Document Revised: 04/30/2013 Document Reviewed: 10/29/2012 [facility] Patient Information 2014 [place].

## 2014-02-16 NOTE — Progress Notes (Signed)
Alejandra Conway is a 28 y.o. G3P1011 @[redacted]w[redacted]d . She reports she is doing well and has no complaints. She denies LOF or vaginal bleeding. She is feeling good fetal movement.   BP 108/49  Pulse 99  Temp(Src) 98.7 F (37.1 C)  Wt 155 lb (70.308 kg)  LMP 07/15/2013 Abd: gravid Ext: trace non pitting edema.   A/P: FHR 135- accels appreciated tdap given today Fetal kick count discussed Pre term labor precautions discussed BF, circ desired F/u 2 weeks (Needs PHQ) F/u 2 weeks

## 2014-03-05 ENCOUNTER — Ambulatory Visit (INDEPENDENT_AMBULATORY_CARE_PROVIDER_SITE_OTHER): Payer: Medicaid Other | Admitting: Family Medicine

## 2014-03-05 VITALS — BP 111/51 | HR 98 | Temp 98.7°F | Wt 156.0 lb

## 2014-03-05 DIAGNOSIS — Z3493 Encounter for supervision of normal pregnancy, unspecified, third trimester: Secondary | ICD-10-CM

## 2014-03-05 DIAGNOSIS — Z3480 Encounter for supervision of other normal pregnancy, unspecified trimester: Secondary | ICD-10-CM

## 2014-03-05 NOTE — Progress Notes (Signed)
Alejandra Conway [name] is a 28 y.o. [phone] @ [redacted]w[redacted]d present for routine prenatal appointment. Patient denies any leaking of fluid, vaginal bleeding or regular contractions. Patient has been experiencing irritability. Her headaches have been controlled. She has felt her baby move a great deal.  BP 111/51  Pulse 98  Temp(Src) 98.7 F (37.1 C)  Wt 156 lb (70.761 kg)  LMP 07/15/2013 Abdomen: Gravid Extremities: No edema or erythema.  A/P: - Fetal heart rate 125. - Fetal kick counts discussed - Preterm labor discussed - Pregnancy Home Risk  screening form completed. No concerns. PHQ 2 - Patient up-to-date with labs and immunizations. - AVS on GBS given today. GBS will need to be collected after 36 weeks - Patient desires circumcision for her child - Followup 2 weeks

## 2014-03-05 NOTE — Patient Instructions (Signed)
Group B Streptococcus Infection During Pregnancy Group B streptococcus (GBS) is a type of bacteria often found in healthy women. GBS is not the same as the bacteria that causes strep throat. You may have GBS in your vagina, rectum, or bladder. GBS does not spread through sexual contact, but it can be passed to a baby during childbirth. This can be dangerous for your baby. It is not dangerous to you and usually does not cause any symptoms. Your health care provider may test you for GBS when your pregnancy is between 35 and 37 weeks. GBS is dangerous only during birth, so there is no need to test for it earlier. It is possible to have GBS during pregnancy and never pass it to your baby. If your test results are positive for GBS, your health care provider may recommend giving you antibiotic medicine during delivery to make sure your baby stays healthy. RISK FACTORS You are more likely to pass GBS to your baby if:   Your water breaks (ruptured membrane) or you go into labor before 37 weeks.  Your water breaks 18 hours before you deliver.  You passed GBS during a previous pregnancy.  You have a urinary tract infection caused by GBS any time during pregnancy.  You have a fever during labor. SYMPTOMS Most women who have GBS do not have any symptoms. If you have a urinary tract infection caused by GBS, you might have frequent or painful urination and fever. Babies who get GBS usually show symptoms within 7 days of birth. Symptoms may include:   Breathing problems.  Heart and blood pressure problems.  Digestive and kidney problems. DIAGNOSIS Routine screening for GBS is recommended for all pregnant women. A health care provider takes a sample of the fluid in your vagina and rectum with a swab. It is then sent to a lab to be checked for GBS. A sample of your urine may also be checked for the bacteria.  TREATMENT If you test positive for GBS, you may need treatment with an antibiotic medicine during  labor. As soon as you go into labor, or as soon as your membranes rupture, you will get the antibiotic medicine through an IV access. You will continue to get the medicine until after you give birth. You do not need antibiotic medicine if you are having a cesarean delivery.If your baby shows signs or symptoms of GBS after birth, your baby can also be treated with an antibiotic medicine. HOME CARE INSTRUCTIONS   Take all antibiotic medicine as prescribed by your health care provider. Only take medicine as directed.   Continue with prenatal visits and care.   Keep all follow-up appointments.  SEEK MEDICAL CARE IF:   You have pain when you urinate.   You have to urinate frequently.   You have a fever.  SEEK IMMEDIATE MEDICAL CARE IF:   Your membranes rupture.  You go into labor. Document Released: 12/05/2007 Document Revised: 09/02/2013 Document Reviewed: 06/20/2013 Exit[employer] Patient Information 2015 [employer]. This information is not intended to replace advice given to you by your health care provider. Make sure you discuss any questions you have with your health care provider.  

## 2014-03-26 ENCOUNTER — Ambulatory Visit (INDEPENDENT_AMBULATORY_CARE_PROVIDER_SITE_OTHER): Payer: Medicaid Other | Admitting: Family Medicine

## 2014-03-26 VITALS — BP 98/60 | HR 98 | Wt 161.0 lb

## 2014-03-26 DIAGNOSIS — Z3480 Encounter for supervision of other normal pregnancy, unspecified trimester: Secondary | ICD-10-CM

## 2014-03-26 DIAGNOSIS — Z3483 Encounter for supervision of other normal pregnancy, third trimester: Secondary | ICD-10-CM

## 2014-03-26 NOTE — Patient Instructions (Signed)
Natural Childbirth Natural childbirth is going through labor and delivery without any drugs to relieve pain. You also do not use fetal monitors, have a cesarean delivery, or get a sugical cut to enlarge the vaginal opening (episiotomy). With the help of a birthing professional (midwife), you will direct your own labor and delivery as you choose. Many women chose natural childbirth because they feel more in control and in touch with their labor and delivery. They are also concerned about the medications affecting themselves and the baby. Pregnant women with a high risk pregnancy should not attempt natural childbirth. It is better to deliver the infant in a hospital if an emergency situation arises. Sometimes, the caregiver has to intervene for the health and safety of the mother and infant. TWO TECHNIQUES FOR NATURAL CHILDBIRTH:   The Lamaze method. This method teaches women that having a baby is normal, healthy, and natural. It also teaches the mother to take a neutral position regarding pain medication and anesthesia and to make an informed decision if and when it is right for them.  The Bradley method (also called husband coached birth). This method teaches the father to be the birth coach and stresses a natural approach. It also encourages exercise and a balanced diet with good nutrition. The exercises teach relaxation and deep breathing techniques. However, there are also classes to prepare the parents for an emergency situation that may occur. METHODS OF DEALING WITH LABOR PAIN AND DELIVERY:  Meditation.  Yoga.  Hypnosis.  Acupuncture.  Massage.  Changing positions (walking, rocking, showering, leaning on birth balls).  Lying in warm water or a jacuzzi.  Find an activity that keeps your mind off of the labor pain.  Listen to soft music.  Visual imagery (focus on a particular object). BEFORE GOING INTO LABOR  Be sure you and your spouse/partner are in agreement to have natural  childbirth.  Decide if your caregiver or a midwife will deliver your baby.  Decide if you will have your baby in the hospital, birthing center, or at home.  If you have children, make plans to have someone to take care of them when you go to the hospital.  Know the distance and the time it takes to go to the delivery center. Make a dry run to be sure.  Have a bag packed with a night gown, bathrobe, and toiletries ready to take when you go into labor.  Keep phone numbers of your family and friends handy if you need to call someone when you go into labor.  Your spouse or partner should go to all the teaching classes.  Talk with your caregiver about the possibility of a medical emergency and what will happen if that occurs. ADVANTAGES OF NATURAL CHILDBIRTH  You are in control of your labor and delivery.  It is safe.  There are no medications or anesthetics that may affect you and the fetus.  There are no invasive procedures such as an episiotomy.  You and your partner will work together, which can increase your bond.  Meditation, yoga, massage, and breathing exercises can be learned while pregnant and help you when you are in labor and at delivery.  In most delivery centers, the family and friends can be involved in the labor and delivery process. DISADVANTAGES OF NATURAL CHILDBIRTH  You will experience pain during your labor and delivery.  The methods of helping relieve your labor pains may not work for you.  You may feel embarrassed, disappointed, and like a failure   if you decide to change your mind during labor and not have natural childbirth. AFTER THE DELIVERY  You will be very tired.  You will be uncomfortable because of your uterus contracting. You will feel soreness around the vagina.  You may feel cold and shaky.This is a natural reaction.  You will be excited, overwhelmed, accomplished, and proud to be a mother. HOME CARE INSTRUCTIONS   Follow the advice and  instructions of your caregiver.  Follow the instructions of your natural childbirth instructor (Lamaze or Momence). Document Released: 08/10/2008 Document Revised: 11/20/2011 Document Reviewed: 05/05/2013 [facility] Patient Information 2015 [place], [place]. This information is not intended to replace advice given to you by your health care provider. Make sure you discuss any questions you have with your health care provider.  I will need to see you weekly until you  Deliver! Looking forward to meeting lil [name].

## 2014-03-26 NOTE — Progress Notes (Signed)
Alejandra Conway is a 28 y.o. female G61P1011 @ [redacted]w[redacted]d present today for routine prenatal appointment. Pt has no complaints other than lower abd pressure. No leaking, gush of fluid or vaginal bleeding. Feeling her baby move frequently.   Abd: gravid Ext: no edema  A/P: - FHR 135 - GBS, G/C collected today. - PTL and fetal kick counts discussed. - BF, Circ, BCP after delivery - discussed  circ clinic at [facility] - F/U weekly, until delivery

## 2014-03-28 LAB — STREP B DNA PROBE: GBSP: DETECTED

## 2014-04-02 ENCOUNTER — Ambulatory Visit (INDEPENDENT_AMBULATORY_CARE_PROVIDER_SITE_OTHER): Payer: Medicaid Other | Admitting: Family Medicine

## 2014-04-02 VITALS — BP 127/58 | HR 87 | Wt 161.0 lb

## 2014-04-02 DIAGNOSIS — Z3493 Encounter for supervision of normal pregnancy, unspecified, third trimester: Secondary | ICD-10-CM

## 2014-04-02 DIAGNOSIS — Z331 Pregnant state, incidental: Secondary | ICD-10-CM

## 2014-04-02 NOTE — Progress Notes (Signed)
[name] is a 28 y.o. G3P1011 @ [redacted]w[redacted]d, feeling well, No LOF, bleeding or contractions. Feeling the baby move well.   BP 127/58  Pulse 87  Wt 161 lb (73.029 kg)  LMP 07/15/2013 Abd: Gravid Ext: no edema  A/P:  - GBS positive, will need abx coverage in labor. Discussed with pt today. She has no allergies.  - Kick counts discussed - BCP (considering vasectomy in spouse), BF, CIRC - f/u 1 week

## 2014-04-02 NOTE — Patient Instructions (Signed)

## 2014-04-09 ENCOUNTER — Ambulatory Visit (INDEPENDENT_AMBULATORY_CARE_PROVIDER_SITE_OTHER): Payer: Medicaid Other | Admitting: Family Medicine

## 2014-04-09 VITALS — BP 103/51 | HR 105 | Temp 98.0°F | Wt 163.0 lb

## 2014-04-09 DIAGNOSIS — Z3493 Encounter for supervision of normal pregnancy, unspecified, third trimester: Secondary | ICD-10-CM

## 2014-04-09 DIAGNOSIS — Z331 Pregnant state, incidental: Secondary | ICD-10-CM

## 2014-04-09 NOTE — Progress Notes (Signed)
Ellanie Oppedisano [name] is a 28 y.o. [phone] @[redacted]w[redacted]d  today for routine prenatal exam. Patient states she thinks she lost her mucous plug a few days ago. She denies leaking, gush of fluid, vaginal bleeding or regular consistent contractions. She has felt her baby move a great deal. She has been noticing more irregular contractions, and lower back ache.  O: BP 103/51  Pulse 105  Temp(Src) 98 F (36.7 C)  Wt 163 lb (73.936 kg)  LMP 07/15/2013 ABD. Gravid 38.5 cm Ext: No erythema, no edema nontender.  Plan: - Mildly tachycardic on exam, patient is mildly dehydrated. Encouraged fluid intake. -GBS positive, will need antibiotic coverage in labor. Patient is not allergic to penicillin. - Fetal kick counts and labor precautions given today. - Birth control pills, breast-feeding, circumcision - May consider ultrasound for fetal position on next appointment. - Followup in one week.

## 2014-04-09 NOTE — Patient Instructions (Signed)
Braxton Hicks Contractions °Contractions of the uterus can occur throughout pregnancy. Contractions are not always a sign that you are in labor.  °WHAT ARE BRAXTON HICKS CONTRACTIONS?  °Contractions that occur before labor are called Braxton Hicks contractions, or false labor. Toward the end of pregnancy (32-34 weeks), these contractions can develop more often and may become more forceful. This is not true labor because these contractions do not result in opening (dilatation) and thinning of the cervix. They are sometimes difficult to tell apart from true labor because these contractions can be forceful and people have different pain tolerances. You should not feel embarrassed if you go to the hospital with false labor. Sometimes, the only way to tell if you are in true labor is for your health care provider to look for changes in the cervix. °If there are no prenatal problems or other health problems associated with the pregnancy, it is completely safe to be sent home with false labor and await the onset of true labor. °HOW CAN YOU TELL THE DIFFERENCE BETWEEN TRUE AND FALSE LABOR? °False Labor °· The contractions of false labor are usually shorter and not as hard as those of true labor.   °· The contractions are usually irregular.   °· The contractions are often felt in the front of the lower abdomen and in the groin.   °· The contractions may go away when you walk around or change positions while lying down.   °· The contractions get weaker and are shorter lasting as time goes on.   °· The contractions do not usually become progressively stronger, regular, and closer together as with true labor.   °True Labor °· Contractions in true labor last 30-70 seconds, become very regular, usually become more intense, and increase in frequency.   °· The contractions do not go away with walking.   °· The discomfort is usually felt in the top of the uterus and spreads to the lower abdomen and low back.   °· True labor can be  determined by your health care provider with an exam. This will show that the cervix is dilating and getting thinner.   °WHAT TO REMEMBER °· Keep up with your usual exercises and follow other instructions given by your health care provider.   °· Take medicines as directed by your health care provider.   °· Keep your regular prenatal appointments.   °· Eat and drink lightly if you think you are going into labor.   °· If Braxton Hicks contractions are making you uncomfortable:   °¨ Change your position from lying down or resting to walking, or from walking to resting.   °¨ Sit and rest in a tub of warm water.   °¨ Drink 2-3 glasses of water. Dehydration may cause these contractions.   °¨ Do slow and deep breathing several times an hour.   °WHEN SHOULD I SEEK IMMEDIATE MEDICAL CARE? °Seek immediate medical care if: °· Your contractions become stronger, more regular, and closer together.   °· You have fluid leaking or gushing from your vagina.   °· You have a fever.   °· You pass blood-tinged mucus.   °· You have vaginal bleeding.   °· You have continuous abdominal pain.   °· You have low back pain that you never had before.   °· You feel your baby's head pushing down and causing pelvic pressure.   °· Your baby is not moving as much as it used to.   °Document Released: 08/28/2005 Document Revised: 09/02/2013 Document Reviewed: 06/09/2013 °ExitCare® Patient Information ©2015 [employer]. This information is not intended to replace advice given to you by your health care   provider. Make sure you discuss any questions you have with your health care provider. ° °

## 2014-04-11 ENCOUNTER — Encounter (HOSPITAL_COMMUNITY): Payer: Self-pay | Admitting: *Deleted

## 2014-04-11 ENCOUNTER — Inpatient Hospital Stay (HOSPITAL_COMMUNITY)
Admission: AD | Admit: 2014-04-11 | Discharge: 2014-04-13 | DRG: 775 | Disposition: A | Discharge status: Home / Self Care | Payer: Medicaid Other | Source: Ambulatory Visit | Attending: Obstetrics and Gynecology | Admitting: Obstetrics and Gynecology

## 2014-04-11 DIAGNOSIS — J45909 Unspecified asthma, uncomplicated: Secondary | ICD-10-CM | POA: Diagnosis present

## 2014-04-11 DIAGNOSIS — Z8249 Family history of ischemic heart disease and other diseases of the circulatory system: Secondary | ICD-10-CM | POA: Diagnosis not present

## 2014-04-11 DIAGNOSIS — O4290 Premature rupture of membranes, unspecified as to length of time between rupture and onset of labor, unspecified weeks of gestation: Principal | ICD-10-CM | POA: Diagnosis present

## 2014-04-11 DIAGNOSIS — G43909 Migraine, unspecified, not intractable, without status migrainosus: Secondary | ICD-10-CM | POA: Diagnosis present

## 2014-04-11 DIAGNOSIS — Z22330 Carrier of Group B streptococcus: Secondary | ICD-10-CM | POA: Diagnosis not present

## 2014-04-11 DIAGNOSIS — O99892 Other specified diseases and conditions complicating childbirth: Secondary | ICD-10-CM | POA: Diagnosis present

## 2014-04-11 DIAGNOSIS — O9989 Other specified diseases and conditions complicating pregnancy, childbirth and the puerperium: Secondary | ICD-10-CM

## 2014-04-11 LAB — CBC
HCT: 36.1 % (ref 36.0–46.0)
Hemoglobin: 11.8 g/dL — ABNORMAL LOW (ref 12.0–15.0)
MCH: 29.3 pg (ref 26.0–34.0)
MCHC: 32.7 g/dL (ref 30.0–36.0)
MCV: 89.6 fL (ref 78.0–100.0)
Platelets: 144 10*3/uL — ABNORMAL LOW (ref 150–400)
RBC: 4.03 MIL/uL (ref 3.87–5.11)
RDW: 14.8 % (ref 11.5–15.5)
WBC: 6.7 10*3/uL (ref 4.0–10.5)

## 2014-04-11 LAB — RPR

## 2014-04-11 LAB — POCT FERN TEST: POCT Fern Test: POSITIVE

## 2014-04-11 MED ORDER — ACETAMINOPHEN 325 MG PO TABS: 650.0000 mg | ORAL_TABLET | ORAL | Status: DC | PRN

## 2014-04-11 MED ORDER — ONDANSETRON HCL 4 MG/2ML IJ SOLN: 4.0000 mg | Freq: Four times a day (QID) | INTRAMUSCULAR | Status: DC | PRN

## 2014-04-11 MED ORDER — DEXTROSE 5 % IV SOLN
5.0000 10*6.[IU] | Freq: Once | INTRAVENOUS | Status: AC
  Administered 2014-04-11: 5 10*6.[IU] via INTRAVENOUS
  Filled 2014-04-11: qty 5

## 2014-04-11 MED ORDER — OXYTOCIN BOLUS FROM INFUSION
500.0000 mL | INTRAVENOUS | Status: DC
  Administered 2014-04-12: 500 mL via INTRAVENOUS

## 2014-04-11 MED ORDER — LACTATED RINGERS IV SOLN: 500.0000 mL | INTRAVENOUS | Status: DC | PRN

## 2014-04-11 MED ORDER — IBUPROFEN 600 MG PO TABS: 600.0000 mg | ORAL_TABLET | Freq: Four times a day (QID) | ORAL | Status: DC | PRN

## 2014-04-11 MED ORDER — LACTATED RINGERS IV SOLN
INTRAVENOUS | Status: DC
  Administered 2014-04-11 – 2014-04-12 (×3): via INTRAVENOUS

## 2014-04-11 MED ORDER — SODIUM CHLORIDE 0.9 % IV SOLN
2.0000 g | Freq: Once | INTRAVENOUS | Status: DC
  Filled 2014-04-11: qty 2000

## 2014-04-11 MED ORDER — LIDOCAINE HCL (PF) 1 % IJ SOLN
30.0000 mL | INTRAMUSCULAR | Status: DC | PRN
  Filled 2014-04-11: qty 30

## 2014-04-11 MED ORDER — TERBUTALINE SULFATE 1 MG/ML IJ SOLN: 0.2500 mg | Freq: Once | INTRAMUSCULAR | Status: AC | PRN

## 2014-04-11 MED ORDER — CITRIC ACID-SODIUM CITRATE 334-500 MG/5ML PO SOLN: 30.0000 mL | ORAL | Status: DC | PRN

## 2014-04-11 MED ORDER — OXYCODONE-ACETAMINOPHEN 5-325 MG PO TABS
1.0000 | ORAL_TABLET | ORAL | Status: DC | PRN
Start: 2014-04-11 — End: 2014-04-12

## 2014-04-11 MED ORDER — OXYTOCIN 40 UNITS IN LACTATED RINGERS INFUSION - SIMPLE MED
1.0000 m[IU]/min | INTRAVENOUS | Status: DC
  Administered 2014-04-11: 2 m[IU]/min via INTRAVENOUS
  Filled 2014-04-11: qty 1000

## 2014-04-11 MED ORDER — PENICILLIN G POTASSIUM 5000000 UNITS IJ SOLR
2.5000 10*6.[IU] | INTRAVENOUS | Status: DC
  Administered 2014-04-11 – 2014-04-12 (×4): 2.5 10*6.[IU] via INTRAVENOUS
  Filled 2014-04-11 (×7): qty 2.5

## 2014-04-11 MED ORDER — OXYTOCIN 40 UNITS IN LACTATED RINGERS INFUSION - SIMPLE MED
62.5000 mL/h | INTRAVENOUS | Status: DC
  Administered 2014-04-12: 62.5 mL/h via INTRAVENOUS

## 2014-04-11 MED ORDER — PENICILLIN G POTASSIUM 5000000 UNITS IJ SOLR: 5.0000 10*6.[IU] | INTRAMUSCULAR | Status: DC

## 2014-04-11 MED ORDER — FLEET ENEMA 7-19 GM/118ML RE ENEM: 1.0000 | ENEMA | RECTAL | Status: DC | PRN

## 2014-04-11 NOTE — Progress Notes (Signed)
[id number] [redacted]w[redacted]d, GBS Detected (07/16 1426) here with premature rupture of membranes.  Dr. Raoul Pitch notified, continuity pt.  Will start pitocin, penicillin, orders placed.  Merla Riches, MD 10:49 AM

## 2014-04-11 NOTE — H&P (Signed)
Alejandra Conway is a 28 y.o. female presenting for premature rupture of membranes at 9:30 this morning.Patient states she experienced a large gush of fluid that was clear to light yellow in nature. She had been feeling contractions for a few hours yesterday evening, but nothing today.   Maternal Medical History:  Reason for admission: Rupture of membranes.     OB History   Grav Para Term Preterm Abortions TAB SAB Ect Mult Living   3 1 1  0 1 1 0 0  1     Past Medical History  Diagnosis Date  . Abnormal Pap smear   . Asthma   . Migraine    History reviewed. No pertinent past surgical history. Family History: family history includes Alkaptonuria in her son; Cancer in her maternal grandfather; Heart disease in her mother and paternal uncle. Social History:  reports that she has never smoked. She does not have any smokeless tobacco history on file. She reports that she does not drink alcohol or use illicit drugs.   Prenatal Transfer Tool  Maternal Diabetes: No Genetic Screening: Normal Maternal Ultrasounds/Referrals: Normal Fetal Ultrasounds or other Referrals:  None Maternal Substance Abuse:  No Significant Maternal Medications:  None Significant Maternal Lab Results:  Lab values include: Group B Strep positive Other Comments:  None  ROS  Dilation: 1.5 Effacement (%): 60 Station: -2 Exam by:: S. Carrera, RNC Blood pressure 111/67, pulse 82, temperature 98.5 F (36.9 C), temperature source Oral, resp. rate 18, height 5\' 2"  (1.575 m), weight 73.936 kg (163 lb), last menstrual period 07/15/2013.  Exam Physical Exam  Prenatal labs: ABO, Rh: O/POS/-- (12/31 1342) Antibody: NEG (12/31 1342) Rubella: 1.22 (12/31 1342) RPR: NON REAC (05/21 1149)  HBsAg: NEGATIVE (12/31 1342)  HIV: NONREACTIVE (05/21 1149)  GBS: Detected (07/16 1426)   Assessment/Plan: [id number] @ [redacted]w[redacted]d Premature ROM Continue penicillin coverage for GBS positive Pit per protocol Vertex confirmed by  [place]  Anticipate vaginal delivery   [place] 04/11/2014, 12:31 PM     I have seen and examined this patient and agree with above documentation in the resident's note.   Nila Nephew, MD OB Fellow 04/11/2014 11:28 PM

## 2014-04-11 NOTE — MAU Note (Signed)
SROM @ 0930. Clear fluid. Minimal contractions.

## 2014-04-11 NOTE — Progress Notes (Signed)
Alejandra Conway is a 28 y.o. G3P1011 at [redacted]w[redacted]d by ultrasound admitted for PROM  Subjective: Patient is feeling her contractions and becoming more uncomfortable. She does not desire an epidural.   Objective: BP 116/65  Pulse 93  Temp(Src) 98.1 F (36.7 C) (Oral)  Resp 18  Ht 5\' 2"  (1.575 m)  Wt 73.936 kg (163 lb)  BMI 29.81 kg/m2  LMP 07/15/2013      FHT:  FHR: 130 bpm, variability: moderate,  accelerations:  Present,  decelerations:  Absent UC:   regular, every 2-4 minutes SVE:   Dilation: 6 Effacement (%): 90 Station: -1 Exam by:: Dr. Raoul Pitch  Labs: Lab Results  Component Value Date   WBC 6.7 04/11/2014   HGB 11.8* 04/11/2014   HCT 36.1 04/11/2014   MCV 89.6 04/11/2014   PLT 144* 04/11/2014    Assessment / Plan: Augmentation of labor, progressing well, PROM at 9:30 am  Labor: progressing on Pit Preeclampsia:  no signs or symptoms of toxicity Fetal Wellbeing:  Category II Pain Control:  Labor support without medications I/D:  n/a Anticipated MOD:  NSVD  Alejandra Conway 04/11/2014, 10:59 PM  Supervised resident Agree with note Alejandra Conway, CNM

## 2014-04-11 NOTE — Progress Notes (Signed)
Ampicillin not available in pyxis. Requested from pharmacy.

## 2014-04-12 ENCOUNTER — Encounter (HOSPITAL_COMMUNITY): Payer: Self-pay | Admitting: *Deleted

## 2014-04-12 DIAGNOSIS — O4290 Premature rupture of membranes, unspecified as to length of time between rupture and onset of labor, unspecified weeks of gestation: Secondary | ICD-10-CM

## 2014-04-12 DIAGNOSIS — O99892 Other specified diseases and conditions complicating childbirth: Secondary | ICD-10-CM

## 2014-04-12 DIAGNOSIS — J45909 Unspecified asthma, uncomplicated: Secondary | ICD-10-CM

## 2014-04-12 DIAGNOSIS — O9989 Other specified diseases and conditions complicating pregnancy, childbirth and the puerperium: Secondary | ICD-10-CM

## 2014-04-12 DIAGNOSIS — G43909 Migraine, unspecified, not intractable, without status migrainosus: Secondary | ICD-10-CM

## 2014-04-12 MED ORDER — PHENYLEPHRINE 40 MCG/ML (10ML) SYRINGE FOR IV PUSH (FOR BLOOD PRESSURE SUPPORT)
80.0000 ug | PREFILLED_SYRINGE | INTRAVENOUS | Status: DC | PRN
  Filled 2014-04-12: qty 2

## 2014-04-12 MED ORDER — IBUPROFEN 600 MG PO TABS
600.0000 mg | ORAL_TABLET | Freq: Four times a day (QID) | ORAL | Status: DC
  Administered 2014-04-12 – 2014-04-13 (×4): 600 mg via ORAL
  Filled 2014-04-12 (×4): qty 1

## 2014-04-12 MED ORDER — LANOLIN HYDROUS EX OINT: TOPICAL_OINTMENT | CUTANEOUS | Status: DC | PRN

## 2014-04-12 MED ORDER — EPHEDRINE 5 MG/ML INJ
10.0000 mg | INTRAVENOUS | Status: DC | PRN
  Filled 2014-04-12: qty 2

## 2014-04-12 MED ORDER — LACTATED RINGERS IV SOLN: 500.0000 mL | Freq: Once | INTRAVENOUS | Status: DC

## 2014-04-12 MED ORDER — DIBUCAINE 1 % RE OINT: 1.0000 "application " | TOPICAL_OINTMENT | RECTAL | Status: DC | PRN

## 2014-04-12 MED ORDER — ONDANSETRON HCL 4 MG/2ML IJ SOLN: 4.0000 mg | INTRAMUSCULAR | Status: DC | PRN

## 2014-04-12 MED ORDER — SIMETHICONE 80 MG PO CHEW: 80.0000 mg | CHEWABLE_TABLET | ORAL | Status: DC | PRN

## 2014-04-12 MED ORDER — DIPHENHYDRAMINE HCL 50 MG/ML IJ SOLN: 12.5000 mg | INTRAMUSCULAR | Status: DC | PRN

## 2014-04-12 MED ORDER — BENZOCAINE-MENTHOL 20-0.5 % EX AERO
1.0000 "application " | INHALATION_SPRAY | CUTANEOUS | Status: DC | PRN
  Administered 2014-04-12: 1 via TOPICAL
  Filled 2014-04-12: qty 56

## 2014-04-12 MED ORDER — ZOLPIDEM TARTRATE 5 MG PO TABS: 5.0000 mg | ORAL_TABLET | Freq: Every evening | ORAL | Status: DC | PRN

## 2014-04-12 MED ORDER — OXYCODONE-ACETAMINOPHEN 5-325 MG PO TABS: 1.0000 | ORAL_TABLET | ORAL | Status: DC | PRN

## 2014-04-12 MED ORDER — FENTANYL 2.5 MCG/ML BUPIVACAINE 1/10 % EPIDURAL INFUSION (WH - ANES): 14.0000 mL/h | INTRAMUSCULAR | Status: DC | PRN

## 2014-04-12 MED ORDER — SENNOSIDES-DOCUSATE SODIUM 8.6-50 MG PO TABS
2.0000 | ORAL_TABLET | ORAL | Status: DC
  Administered 2014-04-12: 2 via ORAL
  Filled 2014-04-12: qty 2

## 2014-04-12 MED ORDER — ONDANSETRON HCL 4 MG PO TABS: 4.0000 mg | ORAL_TABLET | ORAL | Status: DC | PRN

## 2014-04-12 MED ORDER — WITCH HAZEL-GLYCERIN EX PADS: 1.0000 "application " | MEDICATED_PAD | CUTANEOUS | Status: DC | PRN

## 2014-04-12 MED ORDER — FENTANYL CITRATE 0.05 MG/ML IJ SOLN
25.0000 ug | INTRAMUSCULAR | Status: DC | PRN
  Administered 2014-04-12: 25 ug via INTRAVENOUS
  Filled 2014-04-12: qty 2

## 2014-04-12 MED ORDER — PRENATAL MULTIVITAMIN CH
1.0000 | ORAL_TABLET | Freq: Every day | ORAL | Status: DC
  Administered 2014-04-12: 1 via ORAL
  Filled 2014-04-12: qty 1

## 2014-04-12 MED ORDER — DIPHENHYDRAMINE HCL 25 MG PO CAPS: 25.0000 mg | ORAL_CAPSULE | Freq: Four times a day (QID) | ORAL | Status: DC | PRN

## 2014-04-12 MED ORDER — TETANUS-DIPHTH-ACELL PERTUSSIS 5-2.5-18.5 LF-MCG/0.5 IM SUSP: 0.5000 mL | Freq: Once | INTRAMUSCULAR | Status: DC

## 2014-04-12 NOTE — Progress Notes (Signed)
Alejandra Conway is a 28 y.o. G3P1011 at [redacted]w[redacted]d by ultrasound admitted for PROM  Subjective: Patient is in pain and desiring IV pain medications.   Objective: BP 113/65  Pulse 88  Temp(Src) 98.4 F (36.9 C) (Oral)  Resp 18  Ht 5\' 2"  (1.575 m)  Wt 73.936 kg (163 lb)  BMI 29.81 kg/m2  LMP 07/15/2013      FHT:  FHR: 120 bpm, variability: moderate,  accelerations:  Present,  decelerations:  Absent UC:   regular, every 2-3 minutes SVE:   Dilation: 6.5 Effacement (%): 90 Station: 0 Exam by:: Dr. Raoul Pitch  Labs: Lab Results  Component Value Date   WBC 6.7 04/11/2014   HGB 11.8* 04/11/2014   HCT 36.1 04/11/2014   MCV 89.6 04/11/2014   PLT 144* 04/11/2014    Assessment / Plan: Augmentation of labor, progressing well, PROM at 9:30 am  Labor: progressing on Pit Preeclampsia:  no signs or symptoms of toxicity Fetal Wellbeing:  Category II Pain Control:  Labor support without medications I/D:  n/a Anticipated MOD:  NSVD  Howard Pouch 04/12/2014, 5:05 AM  Agree with note Seabron Spates, CNM

## 2014-04-12 NOTE — Progress Notes (Signed)
Alejandra Conway is a 28 y.o. G3P1011 at [redacted]w[redacted]d by ultrasound admitted for PROM  Subjective: Patient is sleeping.   Objective: BP 123/70  Pulse 89  Temp(Src) 98.2 F (36.8 C) (Oral)  Resp 20  Ht 5\' 2"  (1.575 m)  Wt 73.936 kg (163 lb)  BMI 29.81 kg/m2  LMP 07/15/2013      FHT:  FHR: 120 bpm, variability: moderate,  accelerations:  Present,  decelerations:  Absent UC:   regular, every 2-3 minutes SVE:   Dilation: 6.5 Effacement (%): 90 Station: 0 Exam by:: Dr. Raoul Pitch  Labs: Lab Results  Component Value Date   WBC 6.7 04/11/2014   HGB 11.8* 04/11/2014   HCT 36.1 04/11/2014   MCV 89.6 04/11/2014   PLT 144* 04/11/2014    Assessment / Plan: Augmentation of labor, progressing well, PROM at 9:30 am  Labor: progressing on Pit Preeclampsia:  no signs or symptoms of toxicity Fetal Wellbeing:  Category II Pain Control:  Labor support without medications I/D:  n/a Anticipated MOD:  NSVD  Alejandra Conway 04/12/2014, 2:40 AM  Supervised resident Agree with note Seabron Spates, CNM

## 2014-04-12 NOTE — Progress Notes (Signed)
Alejandra Conway is a 28 y.o. G3P1011 at [redacted]w[redacted]d by ultrasound admitted for PROM  Subjective: Patient is feeling more pressure.   Objective: BP 122/64  Pulse 88  Temp(Src) 98.1 F (36.7 C) (Oral)  Resp 18  Ht 5\' 2"  (1.575 m)  Wt 73.936 kg (163 lb)  BMI 29.81 kg/m2  LMP 07/15/2013      FHT:  FHR: 130 bpm, variability: moderate,  accelerations:  Present,  decelerations:  Absent UC:   regular, every 2-3 minutes SVE:   Dilation: 6 Effacement (%): 90 Station: 0 Exam by:: Dr. Raoul Pitch  Labs: Lab Results  Component Value Date   WBC 6.7 04/11/2014   HGB 11.8* 04/11/2014   HCT 36.1 04/11/2014   MCV 89.6 04/11/2014   PLT 144* 04/11/2014    Assessment / Plan: Augmentation of labor, progressing well, PROM at 9:30 am  Labor: progressing on Pit Preeclampsia:  no signs or symptoms of toxicity Fetal Wellbeing:  Category II Pain Control:  Labor support without medications I/D:  n/a Anticipated MOD:  NSVD  Howard Pouch 04/12/2014, 12:44 AM  Supervised resident Agree with note Seabron Spates, CNM

## 2014-04-12 NOTE — Progress Notes (Signed)
Delivery Note At 6:35 AM a viable female was delivered via Vaginal, Spontaneous Delivery (Presentation: ROA).  APGAR: 9, 9; weight TBD Placenta status: Intact, Spontaneous.  Cord: 3V with no complications  Anesthesia: None  Episiotomy: None Lacerations: None Suture Repair: N/A Est. Blood Loss (mL): 150  Mom to postpartum.  Baby to Couplet care / Skin to Skin.  Howard Pouch 04/12/2014, 6:52 AM  Attended delivery Agree with note Seabron Spates, CNM

## 2014-04-12 NOTE — Progress Notes (Signed)
Geselle Cardosa [name] is a 28 y.o. G3P1011 at [redacted]w[redacted]d by ultrasound admitted for PROM  Subjective: Patient is feeling pressure pain.  Objective: BP 116/75  Pulse 92  Temp(Src) 98.4 F (36.9 C) (Oral)  Resp 20  Ht 5\' 2"  (1.575 m)  Wt 73.936 kg (163 lb)  BMI 29.81 kg/m2  LMP 07/15/2013      FHT:  FHR: 120 bpm, variability: moderate,  accelerations:  Present,  decelerations:  Absent UC:   regular, every 2 minutes SVE:   Dilation: 9 Effacement (%): 100 Station: +1 Exam by:: Dr. Raoul Pitch  Labs: Lab Results  Component Value Date   WBC 6.7 04/11/2014   HGB 11.8* 04/11/2014   HCT 36.1 04/11/2014   MCV 89.6 04/11/2014   PLT 144* 04/11/2014    Assessment / Plan: Augmentation of labor, progressing well, PROM at 9:30 am  Labor: progressing on Pit Preeclampsia:  no signs or symptoms of toxicity Fetal Wellbeing:  Category II Pain Control:  Fentanyl I/D:  n/a Anticipated MOD:  NSVD  Howard Pouch 04/12/2014, 6:05 AM  Seen also by me Agree with note Seabron Spates, CNM

## 2014-04-13 ENCOUNTER — Encounter: Payer: Medicaid Other | Admitting: Family Medicine

## 2014-04-13 MED ORDER — NORETHINDRONE 0.35 MG PO TABS: 1.0000 | ORAL_TABLET | Freq: Every day | ORAL | Status: DC

## 2014-04-13 NOTE — Lactation Note (Signed)
This note was copied from the chart of [place]. Lactation Consultation Note Mom stating baby having shallow painful latch. C/O nipple pain, Rt. Has small blister to nipple, short shaft and edema noted #24 NS fitted. To the Lt. Nipple fitted #24 and nipple is slanted w/shorter shaft to bottom of nipple than top. Making latches painful, bruised to end of nipple and sm. Scab to Posterior outer Lt. Nipple. Comfort gels given, encouraged to express colostrum to rub on nipples. Has shells. Baby is fussy, noted gas, difficulty latching d/t fussing. Mom c/o severe pain, very tense, adjusted flange of mouth. After several times mom stated a little better. Encouraged mom to relax during BF. Hand pump given to relieve filling of colostrum and pull nipples out prior to feeding.  Patient Name: [place] Today's Date: 04/13/2014 Reason for consult: Follow-up assessment;Breast/nipple pain;Difficult latch   Maternal Data    Feeding Feeding Type: Breast Fed Length of feed: 5 min  LATCH Score/Interventions Latch: Repeated attempts needed to sustain latch, nipple held in mouth throughout feeding, stimulation needed to elicit sucking reflex. Intervention(s): Skin to skin;Teach feeding cues;Waking techniques Intervention(s): Adjust position;Assist with latch;Breast massage;Breast compression  Audible Swallowing: A few with stimulation Intervention(s): Skin to skin;Hand expression Intervention(s): Skin to skin;Hand expression;Alternate breast massage  Type of Nipple: Everted at rest and after stimulation (uneven shaft/slanted Lt. nipple)  Comfort (Breast/Nipple): Engorged, cracked, bleeding, large blisters, severe discomfort Problem noted: Cracked, bleeding, blisters, bruises Intervention(s): Expressed breast milk to nipple;Hand pump     Hold (Positioning): Assistance needed to correctly position infant at breast and maintain latch. Intervention(s): Breastfeeding basics reviewed;Support  Pillows;Position options;Skin to skin  LATCH Score: 5  Lactation Tools Discussed/Used Tools: Shells;Nipple Shields;Pump;Comfort gels Nipple shield size: 20;24 Shell Type: Inverted Breast pump type: Manual Pump Review: Setup, frequency, and cleaning Initiated by:: Allayne Stack RN Date initiated:: 04/13/14   Consult Status Consult Status: Follow-up Date: 04/13/14 Follow-up type: In-patient    Theodoro Kalata 04/13/2014, 4:56 AM

## 2014-04-13 NOTE — Discharge Summary (Signed)
Obstetric Discharge Summary Reason for Admission: rupture of membranes Prenatal Procedures: ultrasound Intrapartum Procedures: spontaneous vaginal delivery and GBS prophylaxis Postpartum Procedures: none Complications-Operative and Postpartum: none Hemoglobin  Date Value Ref Range Status  04/11/2014 11.8* 12.0 - 15.0 g/dL Final     HCT  Date Value Ref Range Status  04/11/2014 36.1  36.0 - 46.0 % Final    Physical Exam:  Filed Vitals:   04/13/14 0605  BP: 98/47  Pulse: 82  Temp: 97.6 F (36.4 C)  Resp: 18    General: alert, cooperative and appears stated age Lochia: appropriate Uterine Fundus: firm Incision: N/A DVT Evaluation: No evidence of DVT seen on physical exam. Negative Homan's sign. No cords or calf tenderness. No significant calf/ankle edema.  Discharge Diagnoses: Term Pregnancy-delivered and PROM x22 hours  Discharge Information: Date: 04/13/2014 Activity: pelvic rest; 5-6 weeks until appointment Diet: routine Medications: None Condition: stable Instructions: refer to practice specific booklet Discharge to: home   Newborn Data: Live born female  Birth Weight: 6 lb 11.8 oz (3055 g) APGAR: 9, 9  Home with mother.  Howard Pouch 04/13/2014, 7:51 AM  I examined pt and agree with documentation above and resident plan of care. Venia Carbon Michiel Cowboy, CNM

## 2014-04-13 NOTE — Discharge Instructions (Signed)
Vaginal Delivery, Care After °Refer to this sheet in the next few weeks. These discharge instructions provide you with information on caring for yourself after delivery. Your caregiver may also give you specific instructions. Your treatment has been planned according to the most current medical practices available, but problems sometimes occur. Call your caregiver if you have any problems or questions after you go home. °HOME CARE INSTRUCTIONS °· Take over-the-counter or prescription medicines only as directed by your caregiver or pharmacist. °· Do not drink alcohol, especially if you are breastfeeding or taking medicine to relieve pain. °· Do not chew or smoke tobacco. °· Do not use illegal drugs. °· Continue to use good perineal care. Good perineal care includes: °¨ Wiping your perineum from front to back. °¨ Keeping your perineum clean. °· Do not use tampons or douche until your caregiver says it is okay. °· Shower, wash your hair, and take tub baths as directed by your caregiver. °· Wear a well-fitting bra that provides breast support. °· Eat healthy foods. °· Drink enough fluids to keep your urine clear or pale yellow. °· Eat high-fiber foods such as whole grain cereals and breads, brown rice, beans, and fresh fruits and vegetables every day. These foods may help prevent or relieve constipation. °· Follow your caregiver's recommendations regarding resumption of activities such as climbing stairs, driving, lifting, exercising, or traveling. °· Talk to your caregiver about resuming sexual activities. Resumption of sexual activities is dependent upon your risk of infection, your rate of healing, and your comfort and desire to resume sexual activity. °· Try to have someone help you with your household activities and your newborn for at least a few days after you leave the hospital. °· Rest as much as possible. Try to rest or take a nap when your newborn is sleeping. °· Increase your activities gradually. °· Keep  all of your scheduled postpartum appointments. It is very important to keep your scheduled follow-up appointments. At these appointments, your caregiver will be checking to make sure that you are healing physically and emotionally. °SEEK MEDICAL CARE IF:  °· You are passing large clots from your vagina. Save any clots to show your caregiver. °· You have a foul smelling discharge from your vagina. °· You have trouble urinating. °· You are urinating frequently. °· You have pain when you urinate. °· You have a change in your bowel movements. °· You have increasing redness, pain, or swelling near your vaginal incision (episiotomy) or vaginal tear. °· You have pus draining from your episiotomy or vaginal tear. °· Your episiotomy or vaginal tear is separating. °· You have painful, hard, or reddened breasts. °· You have a severe headache. °· You have blurred vision or see spots. °· You feel sad or depressed. °· You have thoughts of hurting yourself or your newborn. °· You have questions about your care, the care of your newborn, or medicines. °· You are dizzy or light-headed. °· You have a rash. °· You have nausea or vomiting. °· You were breastfeeding and have not had a menstrual period within 12 weeks after you stopped breastfeeding. °· You are not breastfeeding and have not had a menstrual period by the 12th week after delivery. °· You have a fever. °SEEK IMMEDIATE MEDICAL CARE IF:  °· You have persistent pain. °· You have chest pain. °· You have shortness of breath. °· You faint. °· You have leg pain. °· You have stomach pain. °· Your vaginal bleeding saturates two or more sanitary pads   in 1 hour. MAKE SURE YOU:   Understand these instructions.  Will watch your condition.  Will get help right away if you are not doing well or get worse. Document Released: 08/25/2000 Document Revised: 01/12/2014 Document Reviewed: 04/24/2012 [facility] Patient Information 2015 [place], [place]. This information is not intended to  replace advice given to you by your health care provider. Make sure you discuss any questions you have with your health care provider.  Please make an appointment today for a  5 weeks postpartum appointment.  I have called in a prescription for a birth control pill that is safe in breast feeding. You can start it 2-3 weeks after you delivered. If you change your mind on birth control method please call the office so we can help you with getting git before your pregnancy medicaid runs out.  Great Job Mom!

## 2014-04-13 NOTE — Lactation Note (Signed)
This note was copied from the chart of [place]. Lactation Consultation Note Mom holding baby STS, states breastfeeding is very painful on the left side, and is tolerable on the right. Baby starting to show hunger cues. Offered to assist with a feeding, mom accepts. Assisted mom to hand express colostrum from the left breast, positioned baby in football, and attempted to latch. Despite baby's wide mouth and flanged lips, mom was in extreme pain and requested to stop within a few seconds of latch. I unlatched the baby and placed comfort gel on breast.  Mom then changed baby over to the right side where he latched well and mom was comfortable.  Discussed feeding plan with mom: Feed on the left as tolerated, using nipple shield if that helps. Use comfort gels until comfortable. If cannot latch baby due to pain, pump the left side with every feeding. If cannot pump, hand express.  Feed baby on demand on the right side, using comfort gels as needed.  Call if any concerns.  Encouraged mom to call the lactation office if she has any concerns, and to attend the BFSG. Patient Name: Alejandra Conway Today's Date: 04/13/2014 Reason for consult: Follow-up assessment;Breast/nipple pain   Maternal Data    Feeding Feeding Type: Breast Fed Length of feed: 30 min  LATCH Score/Interventions Latch: Grasps breast easily, tongue down, lips flanged, rhythmical sucking.  Audible Swallowing: Spontaneous and intermittent  Type of Nipple: Everted at rest and after stimulation  Comfort (Breast/Nipple): Filling, red/small blisters or bruises, mild/mod discomfort  Problem noted: Mild/Moderate discomfort Interventions (Mild/moderate discomfort): Comfort gels;Hand massage;Hand expression  Hold (Positioning): No assistance needed to correctly position infant at breast.  LATCH Score: 9  Lactation Tools Discussed/Used     Consult Status Consult Status: Complete    Dorise Bullion 04/13/2014, 10:37 AM

## 2014-04-14 NOTE — H&P (Signed)
Attestation of Attending Supervision of Advanced Practitioner: Evaluation and management procedures were performed by the PA/NP/CNM/OB Fellow under my supervision/collaboration. Chart reviewed and agree with management and plan.  Bertice Risse V 04/14/2014 7:57 AM

## 2014-04-14 NOTE — Discharge Summary (Signed)
Attestation of Attending Supervision of Advanced Practitioner (CNM/NP): Evaluation and management procedures were performed by the Advanced Practitioner under my supervision and collaboration.  I have reviewed the Advanced Practitioner's note and chart, and I agree with the management and plan.  Lavaughn Haberle 04/14/2014 10:54 AM

## 2014-04-14 NOTE — Progress Notes (Signed)
`````  Attestation of Attending Supervision of Advanced Practitioner: Evaluation and management procedures were performed by the PA/NP/CNM/OB Fellow under my supervision/collaboration. Chart reviewed and agree with management and plan.  Agastya Meister V 04/14/2014 7:57 AM

## 2014-04-23 ENCOUNTER — Encounter: Payer: Medicaid Other | Admitting: Family Medicine

## 2014-05-28 ENCOUNTER — Encounter: Payer: Self-pay | Admitting: Family Medicine

## 2014-05-28 ENCOUNTER — Ambulatory Visit (INDEPENDENT_AMBULATORY_CARE_PROVIDER_SITE_OTHER): Payer: Medicaid Other | Admitting: Family Medicine

## 2014-05-28 VITALS — BP 107/70 | HR 68 | Ht 62.0 in | Wt 134.0 lb

## 2014-05-28 DIAGNOSIS — J45909 Unspecified asthma, uncomplicated: Secondary | ICD-10-CM

## 2014-05-28 DIAGNOSIS — Z3009 Encounter for other general counseling and advice on contraception: Secondary | ICD-10-CM

## 2014-05-28 MED ORDER — ALBUTEROL SULFATE HFA 108 (90 BASE) MCG/ACT IN AERS: 2.0000 | INHALATION_SPRAY | Freq: Four times a day (QID) | RESPIRATORY_TRACT | Status: DC | PRN

## 2014-05-28 MED ORDER — NORGESTIMATE-ETH ESTRADIOL 0.25-35 MG-MCG PO TABS: 1.0000 | ORAL_TABLET | Freq: Every day | ORAL | Status: DC

## 2014-05-28 NOTE — Progress Notes (Signed)
Patient ID: Alejandra Conway, female   DOB: 06-25-1986, 28 y.o.   MRN: [id number] Subjective:     [name] is a 28 y.o. female who presents for a postpartum visit. She is 7 weeks postpartum following a spontaneous vaginal delivery. I have fully reviewed the prenatal and intrapartum course. The delivery was at 69 gestational weeks. Outcome: spontaneous vaginal delivery. Anesthesia: none. Postpartum course has been good. Baby's course has been good. Baby is feeding by bottle - gerber gentle. Bleeding no bleeding. Bowel function is normal. Bladder function is normal. Patient is not sexually active. Contraception method is none. Postpartum depression screening: negative.  The following portions of the patient's history were reviewed and updated as appropriate: allergies, current medications, past family history, past medical history, past social history, past surgical history and problem list.  No signs of depression. Attempted to breast feed, however milk supply was not sufficient. Mother states baby likes to be held more than her first child. They are all bonding well with [name].   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 She has not needed her inhaler, but plans to become more physically active and lost her prescription.   Review of Systems Pertinent items are noted in HPI.   Objective:    BP 107/70  Pulse 68  Ht 5\' 2"  (1.575 m)  Wt 134 lb (60.782 kg)  BMI 24.50 kg/m2  General:  alert, cooperative and appears stated age   Breasts:  inspection negative, no nipple discharge or bleeding, no masses or nodularity palpable  Lungs: clear to auscultation bilaterally  Heart:  regular rate and rhythm, S1, S2 normal, no murmur, click, rub or gallop  Abdomen: soft, non-tender; bowel sounds normal; no masses,  no organomegaly   Assessment:  6-7 week postpartum exam.  PAP smear UTD (normal 2104) Birth Control Counseling  Asthma, Intermittent  Plan:   1. Contraception:  Sprintec BCP 2. Asthma, Intermittent >> albuterol prescribed 3. Follow up in: PRN

## 2014-05-28 NOTE — Patient Instructions (Signed)
Oral Contraception Information Oral contraceptive pills (OCPs) are medicines taken to prevent pregnancy. OCPs work by preventing the ovaries from releasing eggs. The hormones in OCPs also cause the cervical mucus to thicken, preventing the sperm from entering the uterus. The hormones also cause the uterine lining to become thin, not allowing a fertilized egg to attach to the inside of the uterus. OCPs are highly effective when taken exactly as prescribed. However, OCPs do not prevent sexually transmitted diseases (STDs). Safe sex practices, such as using condoms along with the pill, can help prevent STDs.  Before taking the pill, you may have a physical exam and Pap test. Your health care provider may order blood tests. The health care provider will make sure you are a good candidate for oral contraception. Discuss with your health care provider the possible side effects of the OCP you may be prescribed. When starting an OCP, it can take 2 to 3 months for the body to adjust to the changes in hormone levels in your body.  TYPES OF ORAL CONTRACEPTION  The combination pill--This pill contains estrogen and progestin (synthetic progesterone) hormones. The combination pill comes in 21-day, 28-day, or 91-day packs. Some types of combination pills are meant to be taken continuously (365-day pills). With 21-day packs, you do not take pills for 7 days after the last pill. With 28-day packs, the pill is taken every day. The last 7 pills are without hormones. Certain types of pills have more than 21 hormone-containing pills. With 91-day packs, the first 84 pills contain both hormones, and the last 7 pills contain no hormones or contain estrogen only.  The minipill--This pill contains the progesterone hormone only. The pill is taken every day continuously. It is very important to take the pill at the same time each day. The minipill comes in packs of 28 pills. All 28 pills contain the hormone.  ADVANTAGES OF ORAL  CONTRACEPTIVE PILLS  Decreases premenstrual symptoms.   Treats menstrual period cramps.   Regulates the menstrual cycle.   Decreases a heavy menstrual flow.   May treatacne, depending on the type of pill.   Treats abnormal uterine bleeding.   Treats polycystic ovarian syndrome.   Treats endometriosis.   Can be used as emergency contraception.  THINGS THAT CAN MAKE ORAL CONTRACEPTIVE PILLS LESS EFFECTIVE OCPs can be less effective if:   You forget to take the pill at the same time every day.   You have a stomach or intestinal disease that lessens the absorption of the pill.   You take OCPs with other medicines that make OCPs less effective, such as antibiotics, certain HIV medicines, and some seizure medicines.   You take expired OCPs.   You forget to restart the pill on day 7, when using the packs of 21 pills.  RISKS ASSOCIATED WITH ORAL CONTRACEPTIVE PILLS  Oral contraceptive pills can sometimes cause side effects, such as:  Headache.  Nausea.  Breast tenderness.  Irregular bleeding or spotting. Combination pills are also associated with a small increased risk of:  Blood clots.  Heart attack.  Stroke. Document Released: 11/18/2002 Document Revised: 06/18/2013 Document Reviewed: 02/16/2013 [facility] Patient Information 2015 [place], [place]. This information is not intended to replace advice given to you by your health care provider. Make sure you discuss any questions you have with your health care provider.  I have called in your prescription for asthma and birth control If you have any questions on when to start pills, please ask your pharmacist and they  can give you the options.  Congrats!!!!

## 2014-05-28 NOTE — Assessment & Plan Note (Signed)
Sprintec prescribed.

## 2014-05-28 NOTE — Assessment & Plan Note (Signed)
Patient and Family doing well.  Bottle/formula feeding No signs of post partum depression BCP prescribed.  May resume sexual activity as desired F/u as needed.

## 2014-05-28 NOTE — Assessment & Plan Note (Signed)
Plans of increase physical activity.  Albuterol inhaler prescribed.

## 2014-07-13 ENCOUNTER — Encounter: Payer: Self-pay | Admitting: Family Medicine

## 2014-10-01 ENCOUNTER — Encounter: Payer: Self-pay | Admitting: Family Medicine

## 2014-10-01 ENCOUNTER — Ambulatory Visit (INDEPENDENT_AMBULATORY_CARE_PROVIDER_SITE_OTHER): Payer: Medicaid Other | Admitting: Family Medicine

## 2014-10-01 ENCOUNTER — Ambulatory Visit (HOSPITAL_COMMUNITY): Payer: Medicaid Other

## 2014-10-01 ENCOUNTER — Ambulatory Visit (HOSPITAL_COMMUNITY)
Admission: RE | Admit: 2014-10-01 | Discharge: 2014-10-01 | Disposition: A | Discharge status: Home / Self Care | Payer: Medicaid Other | Source: Ambulatory Visit | Attending: Family Medicine | Admitting: Family Medicine

## 2014-10-01 VITALS — BP 118/72 | HR 88 | Temp 98.2°F | Ht 62.0 in | Wt 132.8 lb

## 2014-10-01 DIAGNOSIS — R002 Palpitations: Secondary | ICD-10-CM

## 2014-10-01 DIAGNOSIS — R079 Chest pain, unspecified: Secondary | ICD-10-CM | POA: Insufficient documentation

## 2014-10-01 DIAGNOSIS — G43009 Migraine without aura, not intractable, without status migrainosus: Secondary | ICD-10-CM

## 2014-10-01 MED ORDER — METOPROLOL TARTRATE 50 MG PO TABS: 50.0000 mg | ORAL_TABLET | Freq: Two times a day (BID) | ORAL | Status: DC

## 2014-10-01 NOTE — Patient Instructions (Addendum)
I have placed an echo for you to get completed. If you have acute chest pain, you should be evaluated in emergency room setting. I will start you on a beta blocker called metoprolol. This will help with your palpitations, racing heart and help prevent migraine headaches. If your migraines do not resolve on this medication, we can increase the dose on her next appointment. Follow-up in 2-4 weeks.  Angina Pectoris Angina pectoris, often just called angina, is extreme discomfort in your chest, neck, or arm caused by a lack of blood in the middle and thickest layer of your heart wall (myocardium). It may feel like tightness or heavy pressure. It may feel like a crushing or squeezing pain. Some people say it feels like gas or indigestion. It may go down your shoulders, back, and arms. Some people may have symptoms other than pain. These symptoms include fatigue, shortness of breath, cold sweats, or nausea. There are four different types of angina:  Stable angina--Stable angina usually occurs in episodes of predictable frequency and duration. It usually is brought on by physical activity, emotional stress, or excitement. These are all times when the myocardium needs more oxygen. Stable angina usually lasts a few minutes and often is relieved by taking a medicine that can be taken under your tongue (sublingually). The medicine is called nitroglycerin. Stable angina is caused by a buildup of plaque inside the arteries, which restricts blood flow to the heart muscle (atherosclerosis).  Unstable angina--Unstable angina can occur even when your body experiences little or no physical exertion. It can occur during sleep. It can also occur at rest. It can suddenly increase in severity or frequency. It might not be relieved by sublingual nitroglycerin. It can last up to 30 minutes. The most common cause of unstable angina is a blood clot that has developed on the top of plaque buildup inside a coronary artery. It can lead  to a heart attack if the blood clot completely blocks the artery.  Microvascular angina--This type of angina is caused by a disorder of tiny blood vessels called arterioles. Microvascular angina is more common in women. The pain may be more severe and last longer than other types of angina pectoris.  Prinzmetal or variant angina--This type of angina pectoris usually occurs when your body experiences little or no physical exertion. It especially occurs in the early morning hours. It is caused by a spasm of your coronary artery. HOME CARE INSTRUCTIONS   Only take over-the-counter and prescription medicines as directed by your health care provider.  Stay active or increase your exercise as directed by your health care provider.  Limit strenuous activity as directed by your health care provider.  Limit heavy lifting as directed by your health care provider.  Maintain a healthy weight.  Learn about and eat heart-healthy foods.  Do not use any tobacco products including cigarettes, chewing tobacco or electronic cigarettes. SEEK IMMEDIATE MEDICAL CARE IF:  You experience the following symptoms:  Chest, neck, deep shoulder, or arm pain or discomfort that lasts more than a few minutes.  Chest, neck, deep shoulder, or arm pain or discomfort that goes away and comes back, repeatedly.  Heavy sweating with discomfort, without a noticeable cause.  Shortness of breath or difficulty breathing.  Angina that does not get better after a few minutes of rest or after taking sublingual nitroglycerin. These can all be symptoms of a heart attack, which is a medical emergency! Get medical help at once. Call your local emergency service [phone]  in [place] immediately. Do not  drive yourself to the hospital and do not  wait to for your symptoms to go away. MAKE SURE YOU:  Understand these instructions.  Will watch your condition.  Will get help right away if you are not doing well or get worse. Document  Released: 08/28/2005 Document Revised: 09/02/2013 Document Reviewed: 12/30/2013 [facility] Patient Information 2015 [place], [place]. This information is not intended to replace advice given to you by your health care provider. Make sure you discuss any questions you have with your health care provider.

## 2014-10-01 NOTE — Assessment & Plan Note (Signed)
Started metoprolol 50 milligrams twice a day today. This was mainly started for migraine prophylaxis, but hopefully this will help with her palpitations as well. - Discussed caffeine use today - EKG was normal, and unchanged from prior. Uncertain etiology of chest pain, no current pain today or reproducible pain on exam. Advised naproxen use in the event his muscle skeletal in nature. - Echocardiogram reordered. - Red flags discussed and when to seek emergent care. - Follow-up in 2-4 weeks, may need cardiology referral for event monitor set up.

## 2014-10-01 NOTE — Assessment & Plan Note (Signed)
We'll start metoprolol 50 mg twice a day for migraine prophylaxis. Can taper up if she tolerates. Patient does have a remote history of asthma, that is usually only if she is not feeling well or has upper respiratory infection. Could consider Topamax, or referral to headache clinic as well. Patient to continue Imitrex if able, he does not like the feeling this gives her. Could consider another form of abortive therapy of prophylaxis medications are not useful. - Follow-up in 2 weeks

## 2014-10-01 NOTE — Progress Notes (Signed)
   Subjective:    Patient ID: Alejandra Conway, female    DOB: 27-Mar-1986, 27 y.o.   MRN: [id number]  HPI  Chest pain with dizziness: Recent states for the last 2 weeks she has had intermittent chest pain. Noticed it after doing laundry, she had midsternal chest pain that did not radiate. He stated it hurts worse to move and take a deep breath. It lasted throughout that first night, and then eased off. She states it still felt achy, but the sharpness of the pain had resolved. A few days after the initial encounter, she had again felt the pain while doing dishes. She also states that when she started her cardio workout she feels a chest pain mid sternum. She endorses increased palpitations. On Friday, she felt so dizzy, that she needed to lay down for a second for about 15 minutes. Had a history of palpitations in the past, and has had a 24-hour Holter monitor that did not show any events. He was scheduled for an echocardiogram in 2014, but did not have it completed.  Migraines: Patient has had a known history of migraines without aura. She had them through her pregnancy as well as was given Imitrex for abortive therapy. He states the Imitrex works, but they make her feel "like liquid "and she is unable to take them in care for her children. Her has never been any identifiable triggers with her migraines. She now states she is wondering if there are surrounding her menses. She had a headache all last week, that she could not get to go away with the Imitrex, and her menstrual period was last week. Her migraine headaches usually affects the left side of her head, causes nausea and photophobia.  Health maintenance: Patient is due for a Pap smear. Pap smear was in 2014, and was normal. Prior to that she had a Pap smear in 2011 that was abnormal. SHe is up-to-date on her tetanus and flu shots.  Nonsmoker Past Medical History  Diagnosis Date  . Abnormal Pap smear   . Asthma   . Migraine    Allergies    Allergen Reactions  . Pseudoephedrine     REACTION: shortness of breath    Review of Systems Per history of present illness    Objective:   Physical Exam BP 118/72 mmHg  Pulse 88  Temp(Src) 98.2 F (36.8 C) (Oral)  Ht 5\' 2"  (1.575 m)  Wt 132 lb 12.8 oz (60.238 kg)  BMI 24.28 kg/m2 Gen: Very pleasant, Caucasian female, no acute distress, nontoxic in appearance, well-developed, well-nourished, alert, oriented 3 HEENT: AT. [place].Bilateral eyes without injections or icterus. MMM. CV: RRR no murmur appreciated Chest: CTAB, no wheeze or crackles. No reproducible chest pain with palpation or full range of motion of arms. Ext: No erythema. No edema.  Neuro: Normal gait. PERLA. EOMi. Alert. Cranial nerves II through XII intact. Muscle strength 5 out of 5 upper and lower extremity. DTRs normal and equal bilaterally.       Assessment & Plan:

## 2014-10-01 NOTE — Assessment & Plan Note (Addendum)
Started metoprolol 50 milligrams twice a day today. This was mainly started for migraine prophylaxis, but hopefully this will help with her palpitations as well. - Discussed caffeine use today - EKG was normal, and unchanged from prior. Uncertain etiology of chest pain, no current pain today or reproducible pain on exam. Advised naproxen use in the event his muscle skeletal in nature. - Echocardiogram reordered. - Red flags discussed and when to seek emergent care. - Follow-up in 2-4 weeks, may need cardiology referral for event monitor set up.

## 2014-10-02 LAB — CBC WITH DIFFERENTIAL/PLATELET
Basophils Absolute: 0 10*3/uL (ref 0.0–0.1)
Basophils Relative: 0 % (ref 0–1)
EOS ABS: 0.2 10*3/uL (ref 0.0–0.7)
Eosinophils Relative: 3 % (ref 0–5)
HCT: 40.7 % (ref 36.0–46.0)
Hemoglobin: 13.8 g/dL (ref 12.0–15.0)
Lymphocytes Relative: 44 % (ref 12–46)
Lymphs Abs: 2.7 10*3/uL (ref 0.7–4.0)
MCH: 30.3 pg (ref 26.0–34.0)
MCHC: 33.9 g/dL (ref 30.0–36.0)
MCV: 89.3 fL (ref 78.0–100.0)
MPV: 11.6 fL (ref 8.6–12.4)
Monocytes Absolute: 0.4 10*3/uL (ref 0.1–1.0)
Monocytes Relative: 7 % (ref 3–12)
NEUTROS PCT: 46 % (ref 43–77)
Neutro Abs: 2.9 10*3/uL (ref 1.7–7.7)
Platelets: 213 10*3/uL (ref 150–400)
RBC: 4.56 MIL/uL (ref 3.87–5.11)
RDW: 13.8 % (ref 11.5–15.5)
WBC: 6.2 10*3/uL (ref 4.0–10.5)

## 2014-10-05 ENCOUNTER — Telehealth: Payer: Self-pay | Admitting: Family Medicine

## 2014-10-05 NOTE — Telephone Encounter (Signed)
Please call [name], her labs were good. No anemia.  Thanks.

## 2014-10-05 NOTE — Telephone Encounter (Signed)
Spoke with pt and informed her of below. Zimmerman Rumple, Ananth Fiallos D  

## 2014-10-15 ENCOUNTER — Ambulatory Visit (HOSPITAL_COMMUNITY)
Admission: RE | Admit: 2014-10-15 | Discharge: 2014-10-15 | Disposition: A | Discharge status: Home / Self Care | Payer: Medicaid Other | Source: Ambulatory Visit | Attending: Family Medicine | Admitting: Family Medicine

## 2014-10-15 ENCOUNTER — Telehealth: Payer: Self-pay | Admitting: Family Medicine

## 2014-10-15 DIAGNOSIS — R002 Palpitations: Secondary | ICD-10-CM

## 2014-10-15 DIAGNOSIS — R079 Chest pain, unspecified: Secondary | ICD-10-CM | POA: Insufficient documentation

## 2014-10-15 NOTE — Telephone Encounter (Signed)
Please call pt, her echo was normal. Thanks.

## 2014-10-15 NOTE — Progress Notes (Signed)
  Echocardiogram 2D Echocardiogram has been performed.  Johny Chess 10/15/2014, 10:42 AM

## 2014-10-16 NOTE — Telephone Encounter (Signed)
LVM for patient to call back to give below results

## 2015-03-25 ENCOUNTER — Ambulatory Visit (INDEPENDENT_AMBULATORY_CARE_PROVIDER_SITE_OTHER): Payer: Medicaid Other | Admitting: Student

## 2015-03-25 ENCOUNTER — Encounter: Payer: Self-pay | Admitting: Student

## 2015-03-25 VITALS — BP 129/82 | HR 68 | Temp 98.5°F | Ht 62.0 in | Wt 130.0 lb

## 2015-03-25 DIAGNOSIS — R002 Palpitations: Secondary | ICD-10-CM | POA: Diagnosis not present

## 2015-03-25 DIAGNOSIS — R079 Chest pain, unspecified: Secondary | ICD-10-CM | POA: Diagnosis present

## 2015-03-25 DIAGNOSIS — M25539 Pain in unspecified wrist: Secondary | ICD-10-CM | POA: Insufficient documentation

## 2015-03-25 DIAGNOSIS — M25531 Pain in right wrist: Secondary | ICD-10-CM

## 2015-03-25 DIAGNOSIS — G43009 Migraine without aura, not intractable, without status migrainosus: Secondary | ICD-10-CM | POA: Diagnosis not present

## 2015-03-25 NOTE — Progress Notes (Signed)
   Subjective:    Patient ID: Alejandra Conway, female    DOB: 24-Mar-1986, 30 y.o.   MRN: [id number]  HPI Rt forearm pain: New, for two months. Hx of fracture in the same arm when she was in 5th grade. Able to lift things with the same arm but very careful out concern for pain. She describes the pain as "ache or pull". Pain 1/10 now. 8/10 at its worst. Haven't tried anything. Worse with lifting,  pushing and pulling. Alleviated with resting. The pain about the same since it started.  She thinks her right arm is weaker than her left. No numbness, but "little sensation". No burning sensation. No fever or chill. No neurologic symptom elsewhere. She is right handed. 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 No frequent keyboard use.  Review of Systems Per HPI    Objective:   Physical Exam Gen: No acute distress. Alert, cooperative with exam CV: RRR, no murmurs Resp: No WOB RUE: symmetrical, no swelling or skin changes. No tenderness to palpation. Good perfusion in all fingers. 4+/5 motor strength in right wrist on dorsiflexion. 5/5 elsewhere (shoulder, elbow and left wrist) in both UE's. Sensation intact over all dermatomes. Biceps and triceps reflexs 2+ bilaterally.  Psych: normal affect    Assessment & Plan:

## 2015-03-25 NOTE — Patient Instructions (Signed)
It is nice meeting you today! We have talked about your arm pain. It is likely that it is some sort of inflammatory process in your muscle that may need time to heal. You can also try some ibuprofen 600 mg three time a day for 4 to 5 days.  If your pain worsen or doesn't improve, you can return to clinic or give [place] a call.

## 2015-03-25 NOTE — Assessment & Plan Note (Signed)
Likely soft tissue injury/inflammation although she is not tender to palpation. Less likely to be neurologic given that it is alleviated with rest. Less suspicious for bone fracture also she has hx of fracture in the same arm when she was young. Also, no hx of trauma. Less likely to be vascular given appropriate perfusion and no swelling. - Ibuprofen 600 mg tid for 4-5 days - warm compression/massage - RTC with worsening of sxs, swelling or no improvement on med.

## 2015-04-13 ENCOUNTER — Telehealth: Payer: Self-pay | Admitting: Student

## 2015-04-13 NOTE — Telephone Encounter (Signed)
Called pt after reviewing her chart about her abnormal pap smear in 2011. There was some confusion when patient was here last time. It showed in EPIC that patient won't be due for another PAP smear until 2017. However, patient doesn't appear to have another after the abnormal pap smear in 2011. So, I advised the pt to call and make an appointment as soon as she can. Patient voiced understanding the need for this and agreed to do so.

## 2015-05-03 ENCOUNTER — Ambulatory Visit (INDEPENDENT_AMBULATORY_CARE_PROVIDER_SITE_OTHER): Payer: Medicaid Other | Admitting: Family Medicine

## 2015-05-03 ENCOUNTER — Encounter: Payer: Self-pay | Admitting: Family Medicine

## 2015-05-03 DIAGNOSIS — R002 Palpitations: Secondary | ICD-10-CM | POA: Diagnosis not present

## 2015-05-03 DIAGNOSIS — F418 Other specified anxiety disorders: Secondary | ICD-10-CM

## 2015-05-03 DIAGNOSIS — R079 Chest pain, unspecified: Secondary | ICD-10-CM | POA: Diagnosis present

## 2015-05-03 DIAGNOSIS — F430 Acute stress reaction: Secondary | ICD-10-CM

## 2015-05-03 DIAGNOSIS — G43009 Migraine without aura, not intractable, without status migrainosus: Secondary | ICD-10-CM | POA: Diagnosis not present

## 2015-05-03 HISTORY — DX: Other specified anxiety disorders: F41.8

## 2015-05-03 NOTE — Assessment & Plan Note (Deleted)
PHQ-9: 17 with very difficult  GAD7: 17 with somewhat difficult  MDQ: 9 with minor problem Patient will follow up with psychologist Dr. Gwenlyn Saran on 05/13/15 to learn coping mechanisms and counseling. Will continue to monitor symptoms. No medications indicated at this time, will try frequent meetings with Dr. Gwenlyn Saran first and then reassess.

## 2015-05-03 NOTE — Progress Notes (Signed)
Dr. Juanito Doom (and preceptor Dr. Mingo Amber) requested a Alejandra Conway.  Presenting Issue: See history from Dr. Juanito Doom below.  Patient has difficulty describing her experience but states that about every day to every other day she has the feeling of "losing control" where she gets tense and overwhelmed in, what sounds like, a response to stress.  On the heels of these "episodes" she feels regret especially because of the potential negative effect it is having on her children.  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  Reports her symptoms do occasionally get in the way of her taking care of her house the way she normally would.  Is concerned about impact on kids.  Psychiatric History - Diagnoses: Denies. - Hospitalizations:  Denies. - Pharmacotherapy: Denies. - Outpatient therapy: Attempted to connect with [facility] about two months ago; on waiting list.    Family history of psychiatric issues: Mother was on "depression pills" that patient thinks was mainly related to a really bad marital relationship (patient's stepfather).  "Doing great" now.  Dad reported as having paranoid schizophrenia, depression, and chronic pain.  Lives in [place].  She takes him to his doctor's appointments.  Older half-sister that has "ups and downs" but lives far from home with a "controlling husband" and four children.    Current and history of substance use:  Reports drinking about two Smihrnoffs about one time a week.  Denies the use of drugs including marijuana.  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  First job since she graduated from [employer].  Thinks this might "give her a break from the kids" or "make things worse."  Husband is a Doctor, general practice and is really into healthy eating and fitness.  She describes him as a "dude" which means not very supportive from an emotional standpoint.  Is supportive of her schooling and other aspects.   She plans to start [employer] in the fall for Bachelor's.    PHQ-9: 17 with very difficult GAD7: 17 with somewhat difficult MDQ:  9 with minor problem  Assessment / Plan / Recommendations:  Met with patient along with Dr. Juanito Doom.  Diagnosis remains unclear.  Family history coupled with self-report measures are concerning for a mood or anxiety disorder.  Further questioning is needed.  Patient had difficulty describing her experience and had trouble endorsing mood symptoms (e.g. Depressed, anxious, overwhelmed).  Working diagnosis is "stress reaction" with poor coping.  She was unable to identify anything that she does to manage stress.  This gives [place] lot of room.  Discussed the possibility of medication, therapy, exercise: Medication:  No one in the room thinks medicine is indicated at this time (although self-report measures remain concerning for some type of mood disorder and family history gives her a genetic load). Counseling:  She voiced an interest in counseling however she has a job interview on Thursday and if she gets the job, it will be during business hours.  She scheduled to come back and see me on 9/1 at 9:00 to learn some coping strategies and instructed to call if she can not attend. Exercise:  This would likely be a good place to start to manage stress better.  She has a Building services engineer and is encouraged to go by her husband but early morning and later evenings when he is around she is tired and she doesn't feel comfortable leaving the kids at her mom's to go  do something like this "for herself."  There may be some room here.  Creating some space for her "self" independent of her role as mom and wife seems important and if exercise isn't it, could explore other options.    Will see her back on 9/1 or call to follow on or around that day if she can not attend.

## 2015-05-03 NOTE — Progress Notes (Signed)
Subjective:    Patient ID: Alejandra Conway , female   DOB: 01/22/1986 , 29 y.o..   MRN: [id number]  HPI  Alejandra Conway initally presented for a pap smear but her main concern today was anxiety and depression. No psychiatric history on file. Admits that for the past couple years she has been feeling depressed and having anxiety but she never brought it up to a healthcare professional. She has tried talking to her husband but has received little emotional support. Recently over the last couple months, she has been having intermittent "bad thoughts" of ending her life. She states she has never come up with a plan for ending her life but the idea of her being dead crosses her mind a couple times a month. She is not currently suicidal. At times she feels like her she is an unfit mother and her children deserve better. Admits to feeling sad, hopeless, has loss of interest, feels guilty, decreased energy, decreased concentration, and occasional suicidal thoughts. Denies change in appetite, psychomotor retardation/agitation, visual or auditory hallucinations.  Health Maintenance:  -Needs a pap smear. Last pap smear was 2011.   -  reports that she has never smoked. She does not have any smokeless tobacco history on file. - Review of Systems: Per HPI. All other systems reviewed and are negative. - Past Medical History: Patient Active Problem List   Diagnosis Date Noted  . Pain in joint, forearm 03/25/2015    Class: Acute  . Chest pain 10/01/2014  . Birth control counseling 05/28/2014  . Routine postpartum follow-up 05/28/2014  . Premature rupture of membranes 04/11/2014  . Migraine without aura 12/09/2013  . Dysplastic nevi 10/14/2013  . Pregnancy, subsequent 09/19/2013  . Skin lesion 08/26/2013  . Abdominal pregnancy with intrauterine pregnancy 08/18/2013  . Lumbar back pain 04/16/2013  . Syncope 10/03/2011  . Rubella non-immune status 12/14/2010  . ABNORMAL PAP SMEAR, LGSIL 11/25/2009    . 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 PALPITATIONS 01/01/2008  . ASTHMA, INTERMITTENT 11/08/2006   - Medications: reviewed and updated Current Outpatient Prescriptions  Medication Sig Dispense Refill  . albuterol (PROVENTIL HFA;VENTOLIN HFA) 108 (90 BASE) MCG/ACT inhaler Inhale 2 puffs into the lungs every 6 (six) hours as needed for shortness of breath (with exercise). 1 Inhaler 4  . norgestimate-ethinyl estradiol (SPRINTEC 28) 0.25-35 MG-MCG tablet Take 1 tablet by mouth daily. 1 Package 11  . acetaminophen-codeine (TYLENOL #3) 300-30 MG per tablet Take 1 tablet by mouth every 4 (four) hours as needed for moderate pain. (Patient not taking: Reported on 05/03/2015) 30 tablet 0  . metoprolol (LOPRESSOR) 50 MG tablet Take 1 tablet (50 mg total) by mouth 2 (two) times daily. (Patient not taking: Reported on 05/03/2015) 60 tablet 3  . SUMAtriptan (IMITREX) 100 MG tablet Take 1 tablet (100 mg total) by mouth once as needed for migraine. May repeat in 2 hours if headache persists or recurs. 9 tablet 11   No current facility-administered medications for this visit.    SHx: Drinks alchohol, about 2 smirnoff ice per week. No tobacco use or illicit drug use  PHQ-9: 17 with very difficult  GAD7: 17 with somewhat difficult  MDQ: 9 with minor problem    Objective:   Physical Exam BP 118/67 mmHg  Pulse 78  Temp(Src) 99.2 F (37.3 C) (Oral)  Ht 5\' 2"  (1.575 m)  Wt 131 lb 1.6 oz (59.467 kg)  BMI 23.97 kg/m2 Gen: NAD, alert, cooperative with exam, well-appearing HEENT: NCAT, PERRL, clear  conjunctiva, oropharynx clear, supple neck CV: RRR, good S1/S2, no murmur, no edema, capillary refill brisk  Resp: CTABL, no wheezes, non-labored Abd: SNTND, BS present, no guarding or organomegaly Skin: no rashes, normal turgor  Neuro: no gross deficits.  Psych: Depressed mood, anxious, no suicidal or homicidal ideations      Assessment & Plan:

## 2015-05-03 NOTE — Patient Instructions (Signed)
Thank you for coming in today, it was so nice meeting you! Today we discussed your mood and anxiety and we also had a discussion with Dr. Gwenlyn Saran. I would like you to come in and speak with Dr. Gwenlyn Saran on 05/13/15 at [facility]. If you cannot make this appointment and need to reschedule, please call her at [phone]. If you feel like harming yourself or others, please go to the emergency department immediately.  Additionally, we will reschedule your pap smear for a future date.

## 2015-05-04 DIAGNOSIS — F430 Acute stress reaction: Secondary | ICD-10-CM | POA: Insufficient documentation

## 2015-05-04 HISTORY — DX: Acute stress reaction: F43.0

## 2015-05-13 ENCOUNTER — Ambulatory Visit (INDEPENDENT_AMBULATORY_CARE_PROVIDER_SITE_OTHER): Payer: Medicaid Other | Admitting: Psychology

## 2015-05-13 DIAGNOSIS — F321 Major depressive disorder, single episode, moderate: Secondary | ICD-10-CM | POA: Insufficient documentation

## 2015-05-13 HISTORY — DX: Major depressive disorder, single episode, moderate: F32.1

## 2015-05-13 NOTE — Patient Instructions (Signed)
Please schedule a follow-up for:  September 15th at 9:00.  Call [phone] if you can not attend.  You will get me or my voice mail.   We talked about two different things that have good evidence for helping with your mood: 1.  Exercise five times a week for about an hour AND EVEN IF YOU DON'T FEEL LIKE IT.  Remember the five minute rule. 2.  Journaling can help you worry less and manage your thoughts.  It can help you sleep easier at night too.

## 2015-05-13 NOTE — Progress Notes (Signed)
Reason for follow-up:  Discuss further her reaction to stress and explore potential coping mechanisms.  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  May not be a good fit. She has been hired at [employer] and plans to start working there.  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  Mood issues: No one diagnosed with Bipolar Disorder in her family.  Could not report any distinct period of times lasting more than three days where her mood was elevated or her energy shifted or she had a decreased need for sleep.  She reports at least half of the days in the last week, she had bothersome depressed mood that she (often) awoke with and lasted all day.  She lacks energy and is not engaged in pleasurable activities despite opportunity.  She has difficulty with sleep onset, maintenance and early morning awakenings.  She struggles with feelings of guilt and worthlessness (shame) mostly on the heels of when she manages her stress poorly.  She said she has not had another "moment" (where she responds poorly to feeling stressed) since our last meeting.  She is has been working on this.    Identified goals:  "To be more alive" and "happy."  I asked her for specific ways she would know this is true.  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

## 2015-05-13 NOTE — Assessment & Plan Note (Addendum)
Diagnosis still remains a bit unclear mainly because it is difficult for Alejandra Conway to express herself.  Based on self-report measures and history, I think she likely meets criteria for major depression.  I think the anxiety is a separate but related issue.  I think the likelihood of a Bipolar Disorder exists but there is no strong evidence for it.  Alejandra Conway agrees that mood issues are present more than half of the days and that the constellation of symptoms affects her function to a significant degree.  She would like to avoid medicine if possible.  We explored two evidenced based strategies:  Exercise and journaling.  I attempted both to be as patient driven / decided as possible however she seemed mostly focused on barriers, likely reflecting a fair amount of ambivalence or perhaps a reflection of her mood.  See patient instructions for further plan.    One other note:  Migraine is reported as occurring about 2x a month lasting about a week each time.  This could be impacting mood / function / energy as well.

## 2015-05-27 ENCOUNTER — Ambulatory Visit (INDEPENDENT_AMBULATORY_CARE_PROVIDER_SITE_OTHER): Payer: Medicaid Other | Admitting: Psychology

## 2015-05-27 DIAGNOSIS — F331 Major depressive disorder, recurrent, moderate: Secondary | ICD-10-CM

## 2015-05-27 DIAGNOSIS — F430 Acute stress reaction: Secondary | ICD-10-CM

## 2015-05-27 NOTE — Progress Notes (Signed)
Reason for follow-up:  Alejandra Conway presents for follow-up to check in on her mood and progress with goals she set last visit.  Issues discussed:  Exercise, activity in general, coping with stress, sleep, journaling, job  Identified goals:  She identified exercise and journaling last visit.  She made little to no progress going to the gym.  This is not likely a good match for her despite her identifying it as a goal.  She was more successful in being active - getting outside several times with her son.  She also noted that she used to like to garden and would like to do more of that.  She journaled once and it was somewhat helpful.  Not a regular practice.  Today the focus was on sleep.  She wonders whether sleep might be the reason she is tired, unenergetic, fatigued, and irritable.  Discussed.  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  She has kid coverage but is concerned about being away from her sons that much.

## 2015-05-27 NOTE — Patient Instructions (Signed)
I will give you a phone call a week from today to check-in. We discussed possibly using Melatonin as a sleep aid.  A physician colleague recommended getting 1 mg tablets and cutting them in half initially to see how it feels. The job will change things.  Remember you have choices.  When feeling stressed - it starts with the breath.  If you can take three breaths before reacting, your feeling will often shift.

## 2015-05-27 NOTE — Assessment & Plan Note (Addendum)
Based on patient's evolving report, I suspect she has depression with difficulty coping with anxiety on occasion.  PHQ-9 today was 18.  No evidence of active SI / HI.  She reports one time in the past two weeks she had ideation but she was able to manage.  She is not at all interested in medicine and the two evidenced based strategies we identified last visit were not adhered to.  Patient's motivation appears to be shifting more toward better sleep and the getting settled in her new job.  Discussed possibilities regarding sleep.  She was most interested in Melatonin (although still ambivalent about taking any "medication.").  Discussed with preceptor, Dr. Lindell Noe.  Recommended 1/2 mg at night at first.    See patient instructions for further plan.

## 2015-05-27 NOTE — Assessment & Plan Note (Signed)
Improved with increased awareness and intentionality.  27 year old son has learned to "count to 4" whenever stressed or angry and he and his mom employ this technique together - often resulted in laughter.

## 2015-06-03 ENCOUNTER — Telehealth: Payer: Self-pay | Admitting: Psychology

## 2015-06-03 NOTE — Telephone Encounter (Signed)
Called patient per our plan last visit.  She started her new job on Monday and it is reportedly going well.  She got the Melatonin yesterday and took one last night around 10:00.  She is trying to go to bed at 11:00 since starting her job and getting up at 6:00.  She did not notice a difference with the Melatonin.  She thinks the more regular sleep schedule might be helpful.  Suggested she back up the Melatonin to 9:00 or 9:30 and see if that makes a difference.  She thinks she will try it for at least one week to see if it makes a difference.  Discussed keeping a log for better reporting.  She continues to state that her stress-reactions are improved.  She still experiences moments but she "diffuses" them adequately.  Discussed that life is hard and that this may be perfectly normal.  Feeling shame about her behavior may be an indicator that things are starting to get more out of hand again.  She reports continued depressed mood most days of the week along with fatigue and other associated symptoms of depression.  She did report that she likes her job however.    We agreed to meet again on 10/10 in person at 8:30.

## 2015-06-10 ENCOUNTER — Other Ambulatory Visit: Payer: Self-pay | Admitting: Family Medicine

## 2015-06-14 ENCOUNTER — Other Ambulatory Visit: Payer: Self-pay | Admitting: Family Medicine

## 2015-06-15 ENCOUNTER — Other Ambulatory Visit: Payer: Self-pay | Admitting: Student

## 2015-06-15 ENCOUNTER — Other Ambulatory Visit: Payer: Self-pay | Admitting: Family Medicine

## 2015-06-15 DIAGNOSIS — Z3009 Encounter for other general counseling and advice on contraception: Secondary | ICD-10-CM

## 2015-06-15 NOTE — Telephone Encounter (Signed)
Needs refill on birth control-sprintec?-- [facility] on [address]

## 2015-06-16 MED ORDER — NORGESTIMATE-ETH ESTRADIOL 0.25-35 MG-MCG PO TABS: 1.0000 | ORAL_TABLET | Freq: Every day | ORAL | Status: DC

## 2015-06-21 ENCOUNTER — Ambulatory Visit (INDEPENDENT_AMBULATORY_CARE_PROVIDER_SITE_OTHER): Payer: Medicaid Other | Admitting: Psychology

## 2015-06-21 DIAGNOSIS — F430 Acute stress reaction: Secondary | ICD-10-CM

## 2015-06-21 DIAGNOSIS — F321 Major depressive disorder, single episode, moderate: Secondary | ICD-10-CM

## 2015-06-21 NOTE — Progress Notes (Signed)
Reason for follow-up:  Integrated care follow-up to check in on use of melatonin for sleep and mood.  Issues discussed:  Alejandra Conway presented for follow-up.  She is starting her fourth week of work and it is reportedly going well.  She is adjusting to the demands of working outside the home.  Of note, she is attempting to incorporate some routine exercise.  This past week she went 2 or 3 days to the gym.  She has not noticed a benefit.  She is taking the melatonin nightly with only a few missed doses.  She reports going to sleep reasonably well but has frequent awakenings between 2 am and 4 am.  She states it does not take long for her to go back to sleep but it is disturbing nonetheless.  Identified goals:  Consider the pros and cons of a trial of medication.  Continue with regular exercise to see if this makes a difference.

## 2015-06-21 NOTE — Assessment & Plan Note (Addendum)
PHQ-9 is 11 (somewhat difficult).  At her initial visit with Dr. Juanito Doom, it was 17 so there has been some decrease.  She continues to have fleeting suicidal ideation and unexpected tearfulness.  She smiles brightly in the room. She reports that her demeanor at work is such that no one would thinks he were depressed.  Brought up the idea of a trial of medication - specifically Wellbutrin since lack of energy and fatigue are such prominent symptoms.  She has not been interested in medication in the past, however she has been unable to engage in behavioral strategies to a significant degree.  Therapy that targets her cognitions (in addition to behaviors) might be helpful.  She has tried to get connected to [facility].  We agreed to meet in one week at 8:20.  At that time, I will give her feedback on her self-report measures.  She is supposed to bring a pro and con list for medication.  I will also likely recommend getting connected with a therapist through [facility] to do some more intensive work around depression.

## 2015-06-21 NOTE — Assessment & Plan Note (Addendum)
GAD7 today was 14 (somewhat difficult) - down from 17 on 8/23.  She reports she does not get any short-term or long-term relief in stress / anxiety from her exercise.  She is hoping that it will benefit her health long-term.

## 2015-06-28 ENCOUNTER — Ambulatory Visit (INDEPENDENT_AMBULATORY_CARE_PROVIDER_SITE_OTHER): Payer: Medicaid Other | Admitting: Psychology

## 2015-06-28 DIAGNOSIS — F321 Major depressive disorder, single episode, moderate: Secondary | ICD-10-CM

## 2015-06-28 NOTE — Assessment & Plan Note (Signed)
Gave feedback on self-report measures.    If she decides medication is worth a try, she can schedule with Dr. Juanito Doom to discuss further.  She will plan to follow-up with [facility] for some more intensive therapy (more frequent and longer sessions).  I plan to call in two weeks to do a brief check-in.

## 2015-06-28 NOTE — Progress Notes (Signed)
Reason for follow-up:  [facility]ated [facility] follow-up to discuss pros and cons of medication and to review self-report measures.  Issues discussed:  [place] stated that the main benefit for taking a medication for her mood is the possibility it could help.  The major disadvantages are the possible short-term and long-term effects of medication and fear of getting "dependent" on it.  With questioning, she also stated that taking a daily medicine for mood would be hard for her to do and a concern she had was not remembering to take one.  She remains quite ambivalent about this.  When asked what she would think if she started taking it and there was a significant positive impact on her mood, energy level, sleep, and motivation.  She said "it would be my new best friend."  Discussed the idea of an "experiment" of one month duration to see whether it would be worth taking.  Touched on short-term and long-term side effects as well as the issue of dependency.

## 2015-06-30 ENCOUNTER — Encounter: Payer: Self-pay | Admitting: Psychology

## 2015-06-30 ENCOUNTER — Telehealth: Payer: Self-pay | Admitting: Psychology

## 2015-06-30 NOTE — Telephone Encounter (Signed)
Patient left a VM requesting a letter stating she has been seen by me.  Would like to give it to [facility] in hopes of being allowed to drop some classes that she is not doing well in.  I called her back and left a VM asking for a return call so i can get some additional details.

## 2015-07-01 ENCOUNTER — Telehealth: Payer: Self-pay | Admitting: Psychology

## 2015-07-01 NOTE — Telephone Encounter (Signed)
[name] called yesterday requesting a letter documenting her diagnosis and appointment times with me so that she can take it to [facility].  I let her know she would need to complete a ROI.  I put the letter at the front desk and left a VM letting her know it was there.

## 2015-07-15 ENCOUNTER — Telehealth: Payer: Self-pay | Admitting: Psychology

## 2015-07-15 NOTE — Telephone Encounter (Signed)
[place] as planned to follow-up on progress securing therapy through [facility].  Also wanted to see if she was able to pick up the letter I wrote for her and how this was going.  Left a brief VM and will await follow-up.

## 2015-07-18 ENCOUNTER — Other Ambulatory Visit: Payer: Self-pay | Admitting: Family Medicine

## 2015-08-24 ENCOUNTER — Encounter: Payer: Self-pay | Admitting: Family Medicine

## 2015-08-24 ENCOUNTER — Ambulatory Visit (INDEPENDENT_AMBULATORY_CARE_PROVIDER_SITE_OTHER): Payer: Medicaid Other | Admitting: Family Medicine

## 2015-08-24 VITALS — BP 136/92 | HR 66 | Temp 98.1°F | Ht 62.0 in | Wt 129.0 lb

## 2015-08-24 DIAGNOSIS — Z3009 Encounter for other general counseling and advice on contraception: Secondary | ICD-10-CM

## 2015-08-24 DIAGNOSIS — G43009 Migraine without aura, not intractable, without status migrainosus: Secondary | ICD-10-CM | POA: Diagnosis not present

## 2015-08-24 DIAGNOSIS — R079 Chest pain, unspecified: Secondary | ICD-10-CM | POA: Diagnosis not present

## 2015-08-24 DIAGNOSIS — Z309 Encounter for contraceptive management, unspecified: Secondary | ICD-10-CM

## 2015-08-24 DIAGNOSIS — R002 Palpitations: Secondary | ICD-10-CM | POA: Diagnosis not present

## 2015-08-24 MED ORDER — NORGESTIMATE-ETH ESTRADIOL 0.25-35 MG-MCG PO TABS: 1.0000 | ORAL_TABLET | Freq: Every day | ORAL | Status: DC

## 2015-08-24 NOTE — Patient Instructions (Signed)
Thanks again for coming to visit me today! A refill of your birth control has been sent to your pharmacy. Please return in 04/2016 for your pap smear.  Thanks again! Dr. Gerlean Ren

## 2015-08-24 NOTE — Assessment & Plan Note (Addendum)
-   Sprintec refilled - Follow up in 04/2016 for pap smear

## 2015-08-24 NOTE — Progress Notes (Signed)
Subjective:     Patient ID: Alejandra Conway, female   DOB: [birth year], 29 y.o.   MRN: [id number]  HPI [name] is a 29yo female presenting today for birth control refill. - Denies problems with birth control, no adverse effects noted - Does not smoke - No personal or family history of blood clots - Does not wish to change method at this time. Has been on OCPs for a long time and prefers this method, tried patch once  Review of Systems Per HPI    Objective:   Physical Exam  Constitutional: She appears well-developed and well-nourished. No distress.  Cardiovascular: Normal rate.  Exam reveals no gallop and no friction rub.   No murmur heard. Pulmonary/Chest: Effort normal. No respiratory distress. She has no wheezes. She has no rales.  Musculoskeletal: She exhibits no edema.  Psychiatric: She has a normal mood and affect. Her behavior is normal.       Assessment and Plan:     Birth control counseling - Sprintec refilled - Follow up in 04/2016 for pap smear

## 2015-08-30 ENCOUNTER — Ambulatory Visit: Payer: Medicaid Other | Admitting: Student

## 2015-11-16 ENCOUNTER — Ambulatory Visit (INDEPENDENT_AMBULATORY_CARE_PROVIDER_SITE_OTHER): Payer: Medicaid Other | Admitting: Family Medicine

## 2015-11-16 ENCOUNTER — Encounter: Payer: Self-pay | Admitting: Family Medicine

## 2015-11-16 VITALS — BP 120/71 | HR 78 | Temp 98.3°F | Ht 62.0 in | Wt 129.0 lb

## 2015-11-16 DIAGNOSIS — G43009 Migraine without aura, not intractable, without status migrainosus: Secondary | ICD-10-CM | POA: Diagnosis not present

## 2015-11-16 DIAGNOSIS — G43909 Migraine, unspecified, not intractable, without status migrainosus: Secondary | ICD-10-CM | POA: Insufficient documentation

## 2015-11-16 DIAGNOSIS — F321 Major depressive disorder, single episode, moderate: Secondary | ICD-10-CM

## 2015-11-16 DIAGNOSIS — G43119 Migraine with aura, intractable, without status migrainosus: Secondary | ICD-10-CM

## 2015-11-16 DIAGNOSIS — R002 Palpitations: Secondary | ICD-10-CM | POA: Diagnosis not present

## 2015-11-16 DIAGNOSIS — R079 Chest pain, unspecified: Secondary | ICD-10-CM | POA: Diagnosis present

## 2015-11-16 MED ORDER — ALBUTEROL SULFATE HFA 108 (90 BASE) MCG/ACT IN AERS: 2.0000 | INHALATION_SPRAY | Freq: Four times a day (QID) | RESPIRATORY_TRACT | Status: DC | PRN

## 2015-11-16 MED ORDER — FLUOXETINE HCL 10 MG PO CAPS: 10.0000 mg | ORAL_CAPSULE | Freq: Every day | ORAL | Status: DC

## 2015-11-16 MED ORDER — TOPIRAMATE 25 MG PO CPSP: 25.0000 mg | ORAL_CAPSULE | Freq: Every day | ORAL | Status: DC

## 2015-11-16 NOTE — Patient Instructions (Addendum)
Thank you for coming in today, it was nice to see you!  For your mood, we will start you start you on Prozac 10 mg daily. If you start having thoughts about ending your life, go to the emergency room.  For your migraines, we will start you on Topamax 25 mg every night and Ibuprofen as needed.   I would like to see you back in 4-6 weeks to follow up and see how you're doing.  If you have any questions or concerns, please do not hesitate to call the office at [phone].  Sincerely,  Smitty Cords, MD   Contraception Choices Contraception (birth control) is the use of any methods or devices to prevent pregnancy. Below are some methods to help avoid pregnancy. HORMONAL METHODS   Contraceptive implant. This is a thin, plastic tube containing progesterone hormone. It does not contain estrogen hormone. Your health care provider inserts the tube in the inner part of the upper arm. The tube can remain in place for up to 3 years. After 3 years, the implant must be removed. The implant prevents the ovaries from releasing an egg (ovulation), thickens the cervical mucus to prevent sperm from entering the uterus, and thins the lining of the inside of the uterus.  Progesterone-only injections. These injections are given every 3 months by your health care provider to prevent pregnancy. This synthetic progesterone hormone stops the ovaries from releasing eggs. It also thickens cervical mucus and changes the uterine lining. This makes it harder for sperm to survive in the uterus.  Birth control pills. These pills contain estrogen and progesterone hormone. They work by preventing the ovaries from releasing eggs (ovulation). They also cause the cervical mucus to thicken, preventing the sperm from entering the uterus. Birth control pills are prescribed by a health care provider.Birth control pills can also be used to treat heavy periods.  Minipill. This type of birth control pill contains only the  progesterone hormone. They are taken every day of each month and must be prescribed by your health care provider.  Birth control patch. The patch contains hormones similar to those in birth control pills. It must be changed once a week and is prescribed by a health care provider.  Vaginal ring. The ring contains hormones similar to those in birth control pills. It is left in the vagina for 3 weeks, removed for 1 week, and then a new one is put back in place. The patient must be comfortable inserting and removing the ring from the vagina.A health care provider's prescription is necessary.  Emergency contraception. Emergency contraceptives prevent pregnancy after unprotected sexual intercourse. This pill can be taken right after sex or up to 5 days after unprotected sex. It is most effective the sooner you take the pills after having sexual intercourse. Most emergency contraceptive pills are available without a prescription. Check with your pharmacist. Do not use emergency contraception as your only form of birth control. BARRIER METHODS   Female condom. This is a thin sheath (latex or rubber) that is worn over the penis during sexual intercourse. It can be used with spermicide to increase effectiveness.  Female condom. This is a soft, loose-fitting sheath that is put into the vagina before sexual intercourse.  Diaphragm. This is a soft, latex, dome-shaped barrier that must be fitted by a health care provider. It is inserted into the vagina, along with a spermicidal jelly. It is inserted before intercourse. The diaphragm should be left in the vagina for 6 to 8  hours after intercourse.  Cervical cap. This is a round, soft, latex or plastic cup that fits over the cervix and must be fitted by a health care provider. The cap can be left in place for up to 48 hours after intercourse.  Sponge. This is a soft, circular piece of polyurethane foam. The sponge has spermicide in it. It is inserted into the vagina  after wetting it and before sexual intercourse.  Spermicides. These are chemicals that kill or block sperm from entering the cervix and uterus. They come in the form of creams, jellies, suppositories, foam, or tablets. They do not require a prescription. They are inserted into the vagina with an applicator before having sexual intercourse. The process must be repeated every time you have sexual intercourse. INTRAUTERINE CONTRACEPTION  Intrauterine device (IUD). This is a T-shaped device that is put in a woman's uterus during a menstrual period to prevent pregnancy. There are 2 types:  Copper IUD. This type of IUD is wrapped in copper wire and is placed inside the uterus. Copper makes the uterus and fallopian tubes produce a fluid that kills sperm. It can stay in place for 10 years.  Hormone IUD. This type of IUD contains the hormone progestin (synthetic progesterone). The hormone thickens the cervical mucus and prevents sperm from entering the uterus, and it also thins the uterine lining to prevent implantation of a fertilized egg. The hormone can weaken or kill the sperm that get into the uterus. It can stay in place for 3-5 years, depending on which type of IUD is used. PERMANENT METHODS OF CONTRACEPTION  Female tubal ligation. This is when the woman's fallopian tubes are surgically sealed, tied, or blocked to prevent the egg from traveling to the uterus.  Hysteroscopic sterilization. This involves placing a small coil or insert into each fallopian tube. Your doctor uses a technique called hysteroscopy to do the procedure. The device causes scar tissue to form. This results in permanent blockage of the fallopian tubes, so the sperm cannot fertilize the egg. It takes about 3 months after the procedure for the tubes to become blocked. You must use another form of birth control for these 3 months.  Female sterilization. This is when the female has the tubes that carry sperm tied off (vasectomy).This  blocks sperm from entering the vagina during sexual intercourse. After the procedure, the man can still ejaculate fluid (semen). NATURAL PLANNING METHODS  Natural family planning. This is not having sexual intercourse or using a barrier method (condom, diaphragm, cervical cap) on days the woman could become pregnant.  Calendar method. This is keeping track of the length of each menstrual cycle and identifying when you are fertile.  Ovulation method. This is avoiding sexual intercourse during ovulation.  Symptothermal method. This is avoiding sexual intercourse during ovulation, using a thermometer and ovulation symptoms.  Post-ovulation method. This is timing sexual intercourse after you have ovulated. Regardless of which type or method of contraception you choose, it is important that you use condoms to protect against the transmission of sexually transmitted infections (STIs). Talk with your health care provider about which form of contraception is most appropriate for you.   This information is not intended to replace advice given to you by your health care provider. Make sure you discuss any questions you have with your health care provider.   Document Released: 08/28/2005 Document Revised: 09/02/2013 Document Reviewed: 02/20/2013 Elsevier Interactive Patient Education 2016 Elsevier Inc.       Fluoxetine capsules or tablets (  Depression/Mood Disorders) What is this medicine? FLUOXETINE (floo OX e teen) belongs to a class of drugs known as selective serotonin reuptake inhibitors (SSRIs). It helps to treat mood problems such as depression, obsessive compulsive disorder, and panic attacks. It can also treat certain eating disorders. This medicine may be used for other purposes; ask your health care provider or pharmacist if you have questions. What should I tell my health care provider before I take this medicine? They need to know if you have any of these conditions: -bipolar disorder  or mania -diabetes -glaucoma -liver disease -psychosis -seizures -suicidal thoughts or history of attempted suicide -an unusual or allergic reaction to fluoxetine, other medicines, foods, dyes, or preservatives -pregnant or trying to get pregnant -breast-feeding How should I use this medicine? Take this medicine by mouth with a glass of water. Follow the directions on the prescription label. You can take this medicine with or without food. Take your medicine at regular intervals. Do not take it more often than directed. Do not stop taking this medicine suddenly except upon the advice of your doctor. Stopping this medicine too quickly may cause serious side effects or your condition may worsen. A special MedGuide will be given to you by the pharmacist with each prescription and refill. Be sure to read this information carefully each time. Talk to your pediatrician regarding the use of this medicine in children. While this drug may be prescribed for children as young as 7 years for selected conditions, precautions do apply. Overdosage: If you think you have taken too much of this medicine contact a poison control center or emergency room at once. NOTE: This medicine is only for you. Do not share this medicine with others. What if I miss a dose? If you miss a dose, skip the missed dose and go back to your regular dosing schedule. Do not take double or extra doses. What may interact with this medicine? Do not take fluoxetine with any of the following medications: -other medicines containing fluoxetine, like Sarafem or Symbyax -cisapride -linezolid -MAOIs like Carbex, Eldepryl, Marplan, Nardil, and Parnate -methylene blue (injected into a vein) -pimozide -thioridazine This medicine may also interact with the following medications: -alcohol -aspirin and aspirin-like medicines -carbamazepine -certain medicines for depression, anxiety, or psychotic disturbances -certain medicines for migraine  headaches like almotriptan, eletriptan, frovatriptan, naratriptan, rizatriptan, sumatriptan, zolmitriptan -digoxin -diuretics -fentanyl -flecainide -furazolidone -isoniazid -lithium -medicines for sleep -medicines that treat or prevent blood clots like warfarin, enoxaparin, and dalteparin -NSAIDs, medicines for pain and inflammation, like ibuprofen or naproxen -phenytoin -procarbazine -propafenone -rasagiline -ritonavir -supplements like St. John's wort, kava kava, valerian -tramadol -tryptophan -vinblastine This list may not describe all possible interactions. Give your health care provider a list of all the medicines, herbs, non-prescription drugs, or dietary supplements you use. Also tell them if you smoke, drink alcohol, or use illegal drugs. Some items may interact with your medicine. What should I watch for while using this medicine? Tell your doctor if your symptoms do not get better or if they get worse. Visit your doctor or health care professional for regular checks on your progress. Because it may take several weeks to see the full effects of this medicine, it is important to continue your treatment as prescribed by your doctor. Patients and their families should watch out for new or worsening thoughts of suicide or depression. Also watch out for sudden changes in feelings such as feeling anxious, agitated, panicky, irritable, hostile, aggressive, impulsive, severely restless, overly excited and hyperactive,  or not being able to sleep. If this happens, especially at the beginning of treatment or after a change in dose, call your health care professional. Dennis Bast may get drowsy or dizzy. Do not drive, use machinery, or do anything that needs mental alertness until you know how this medicine affects you. Do not stand or sit up quickly, especially if you are an older patient. This reduces the risk of dizzy or fainting spells. Alcohol may interfere with the effect of this medicine. Avoid  alcoholic drinks. Your mouth may get dry. Chewing sugarless gum or sucking hard candy, and drinking plenty of water may help. Contact your doctor if the problem does not go away or is severe. This medicine may affect blood sugar levels. If you have diabetes, check with your doctor or health care professional before you change your diet or the dose of your diabetic medicine. What side effects may I notice from receiving this medicine? Side effects that you should report to your doctor or health care professional as soon as possible: -allergic reactions like skin rash, itching or hives, swelling of the face, lips, or tongue -breathing problems -confusion -eye pain, changes in vision -fast or irregular heart rate, palpitations -flu-like fever, chills, cough, muscle or joint aches and pains -seizures -suicidal thoughts or other mood changes -swelling or redness in or around the eye -tremors -trouble sleeping -unusual bleeding or bruising -unusually tired or weak -vomiting Side effects that usually do not require medical attention (report to your doctor or health care professional if they continue or are bothersome): -change in sex drive or performance -diarrhea -dry mouth -flushing -headache -increased or decreased appetite -nausea -sweating This list may not describe all possible side effects. Call your doctor for medical advice about side effects. You may report side effects to FDA at 1-800-FDA-1088. Where should I keep my medicine? Keep out of the reach of children. Store at room temperature between 15 and 30 degrees C (59 and 86 degrees F). Throw away any unused medicine after the expiration date. NOTE: This sheet is a summary. It may not cover all possible information. If you have questions about this medicine, talk to your doctor, pharmacist, or health care provider.    2016, Elsevier/Gold Standard. (2014-08-21 12:40:07)

## 2015-11-16 NOTE — Progress Notes (Signed)
Subjective:    Patient ID: Alejandra Conway , female   DOB: 1986-01-06 , 30 y.o..   MRN: [id number]  HPI  [name] is here for depression, anxiety, and migraines.  Migraines: Has had a migraine almost every day. She felt as if she had a migraine for all of February. Tried excedrin before, is not helping anymore. Tried Imitrex before, it did help but she did not like this medicine because she had to lay down after taking it. Headache is mostly frontal, bilateral, and pain is sometimes pounding. Develops nausea a times. Denies blurry vision, eye tearing, or rhinorrhea. Positive aura. Sensitive to light and sound.   Depression and Anxiety: Still having depression, but anxiety has subsided somewhat. No panic attacks. Has not been able to schedule appointment with [facility] psychiatry as there was some "phone tag" and just couldn't get an appointment. Pt states sometimes she has good and bad days. Lately she is feeling very depressed though. It is to the point where she feels like she is ready to take a medication. She states she does not want to kill herself but sometimes just wishes she could die. She knows that she needs to be there for her children and tries not to be in a bad mood with them.  Depression screen PHQ 2/9 11/16/2015  Decreased Interest 2  Down, Depressed, Hopeless 3  PHQ - 2 Score 5  Altered sleeping 2  Tired, decreased energy 3  Change in appetite 1  Feeling bad or failure about yourself  1  Trouble concentrating 1  Moving slowly or fidgety/restless 1  Suicidal thoughts 0  PHQ-9 Score 14  Difficult doing work/chores Very difficult    Review of Systems: Per HPI. All other systems reviewed and are negative.  Medications: reviewed and updated  Social Hx:   reports that she has never smoked. She does not have any smokeless tobacco history on file.    Objective:   BP 120/71 mmHg  Pulse 78  Temp(Src) 98.3 F (36.8 C) (Oral)  Ht 5\' 2"  (1.575 m)  Wt 129 lb (58.514 kg)   BMI 23.59 kg/m2  LMP 11/02/2015 Physical Exam  Gen: NAD, alert, cooperative with exam, well-appearing HEENT: NCAT, PERRL, clear conjunctiva, oropharynx clear, supple neck CV: RRR, good S1/S2, no murmur, no edema, capillary refill brisk  Resp: CTABL, no wheezes, non-labored Psych: depressed mood. Normal affect. No Suicidal or homicidal ideations. No visual or auditory hallucinations     Assessment & Plan:  Depression, Moderate Uncontrolled. Associated with some anxiety. No panic attacks. PHQ-9 of 14 today. No suicidal or homicidal ideations but does have passive death wish. No visual or auditory hallucinations.  - Pt agreeable to start SSRI therapy today. Will start on low dose: 10 mg Prozac daily - Side effects reviewed with patient, including increased risk of suicidal thoughts.  - Hand out on Fluoxetine provided - Pt instructed to call 911 or go to emergency department if actively suicidal. - Follow up in 4-6 weeks  Migraine Uncontrolled. Was previously controlled with Excedrin migraine but now that is not helping. Was considering starting Imitrex again but is not appropriate medication with SSRI. Pt using OCPs which could be making migraines worse as headache is a known side effect. Normotensive. No red flag symptoms.  - Will begin low dose Topomax 25 mg qhs. Discussed treatment options with Dr. Gwendlyn Deutscher prior to prescribing. Of note: Topomax has been shown to decrease efficacy of OCPs if taking 200 mg or more/day.  Likely will not need to increase Topomax to that dose, but did review alternative BC options with patient and provided informational handout. - Follow up in 4-6 weeks

## 2015-11-17 NOTE — Assessment & Plan Note (Addendum)
Uncontrolled. Was previously controlled with Excedrin migraine but now that is not helping. Was considering starting Imitrex again but is not appropriate medication with SSRI. Pt using OCPs which could be making migraines worse as headache is a known side effect. Normotensive. No red flag symptoms.  - Will begin low dose Topomax 25 mg qhs. Discussed treatment options with Dr. Gwendlyn Deutscher prior to prescribing. Of note: Topomax has been shown to decrease efficacy of OCPs if taking 200 mg or more/day. Likely will not need to increase Topomax to that dose, but did review alternative BC options with patient and provided informational handout. - Follow up in 4-6 weeks

## 2015-11-17 NOTE — Assessment & Plan Note (Addendum)
Uncontrolled. Associated with some anxiety. No panic attacks. PHQ-9 of 14 today. No suicidal or homicidal ideations but does have passive death wish. No visual or auditory hallucinations.  - Pt agreeable to start SSRI therapy today. Will start on low dose: 10 mg Prozac daily - Side effects reviewed with patient, including increased risk of suicidal thoughts.  - Hand out on Fluoxetine provided - Pt instructed to call 911 or go to emergency department if actively suicidal. - Follow up in 4-6 weeks

## 2015-12-17 ENCOUNTER — Ambulatory Visit (INDEPENDENT_AMBULATORY_CARE_PROVIDER_SITE_OTHER): Payer: Self-pay | Admitting: Family Medicine

## 2015-12-17 ENCOUNTER — Encounter: Payer: Self-pay | Admitting: Family Medicine

## 2015-12-17 VITALS — BP 126/88 | HR 78 | Temp 98.5°F | Wt 122.1 lb

## 2015-12-17 DIAGNOSIS — G43019 Migraine without aura, intractable, without status migrainosus: Secondary | ICD-10-CM

## 2015-12-17 DIAGNOSIS — F321 Major depressive disorder, single episode, moderate: Secondary | ICD-10-CM

## 2015-12-17 MED ORDER — FLUOXETINE HCL 20 MG PO TABS: 20.0000 mg | ORAL_TABLET | Freq: Every day | ORAL | Status: DC

## 2015-12-17 NOTE — Assessment & Plan Note (Addendum)
Improving. No suicidal ideations or homicidal ideations.  - Increase Prozac to 20 mg daily to reach a more therapeutic dose - Pt instructed to call 911 or go to the emergency department if she is actively suicidal - Follow up in 4-6 weeks
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
## 2015-12-17 NOTE — Patient Instructions (Addendum)
Thank you for coming in today, it was so nice to see you!  We will increase your dose of Prozac to 20 mg daily.   I would like to follow up with you in 4-6 weeks.  If you have any questions or concerns, please do not hesitate to call the office at [phone].  Sincerely,  Smitty Cords, MD

## 2015-12-17 NOTE — Assessment & Plan Note (Signed)
Controlled. No headaches in last couple weeks. Positive intermittent tingling in hands, likely side effect of Topomax. - Continue Topomax 25 mg qhs - Patient instructed to keep well hydrated - Follow up in 4-6 weeks

## 2016-01-06 ENCOUNTER — Other Ambulatory Visit: Payer: Self-pay | Admitting: *Deleted

## 2016-01-26 ENCOUNTER — Telehealth: Payer: Self-pay | Admitting: Family Medicine

## 2016-01-26 DIAGNOSIS — F321 Major depressive disorder, single episode, moderate: Secondary | ICD-10-CM

## 2016-01-26 MED ORDER — FLUOXETINE HCL 20 MG PO TABS: 20.0000 mg | ORAL_TABLET | Freq: Every day | ORAL | Status: DC

## 2016-01-26 NOTE — Telephone Encounter (Signed)
Called patient back. Patient stated she was unable to get the new prescription medication. Sent prescription again to [facility] on [address]. Called pharmacy and confirmed they have the refill there for her so it should be available. Thank you.

## 2016-01-26 NOTE — Telephone Encounter (Signed)
Pt called because she had a new prescription called to the pharmacy in April for her Prozac. She didn't know that it was there since she still had medication from before and this was a new dosage amount. The pharmacy has already put it back can we call and let them know she can get it filled or do we have to send another prescription. jw

## 2016-01-27 ENCOUNTER — Telehealth: Payer: Self-pay | Admitting: Family Medicine

## 2016-01-27 NOTE — Telephone Encounter (Signed)
Pt called and needs the tablets to be switched to capsules so that she can afford them. They are on the 4.00 list. jw

## 2016-01-27 NOTE — Telephone Encounter (Signed)
Called patient's pharmacy and switched tablets to capsules for Prozac prescription. Called patient and left voicemail to inform her. Thank you.

## 2016-04-10 ENCOUNTER — Other Ambulatory Visit: Payer: Self-pay | Admitting: Family Medicine

## 2016-04-10 DIAGNOSIS — G43119 Migraine with aura, intractable, without status migrainosus: Secondary | ICD-10-CM

## 2016-06-01 ENCOUNTER — Telehealth: Payer: Self-pay | Admitting: Family Medicine

## 2016-06-01 NOTE — Telephone Encounter (Signed)
Returned patient's call. Patient states she is concerned because for the past month she feels like she's "slipping". Her anxiety attacks have been creeping up on her and she is worried that they will get as bad as they were before if she doesn't address the issue now. She admits that the other night she even woke up out of sleep and had an anxiety attack. She denies any new stressors in her life and says her relationships are all in a good place. Denies any current suicidal ideation but admits she has had suicidal thoughts in the past prior to being on Prozac. Discussed with patient that I am glad she called and I think she should be seen ASAP in our clinic. Additionally, discussed downloading the app "calm" and trying deep breathing exercises.    Called [facility] and scheduled patient to be seen by Dr. Gwenlyn Saran tomorrow at 10:00 AM. Patient is aware of appointment time and grateful she will be seeing Dr. Gwenlyn Saran.   Side note: Patient is concerned about the cost of the visit because she does not have insurance anymore. Discussed that she will need to call the clinic and schedule an appointment with the financial department about obtaining an orange card.   Smitty Cords, MD [place], PGY-2

## 2016-06-01 NOTE — Telephone Encounter (Signed)
Pt is calling and needs to speak to Dr. Juanito Doom about her anxiety, She is really having some issues today and would like to speak to the doctor. jw

## 2016-06-02 ENCOUNTER — Ambulatory Visit (INDEPENDENT_AMBULATORY_CARE_PROVIDER_SITE_OTHER): Payer: Self-pay | Admitting: Psychology

## 2016-06-02 DIAGNOSIS — F321 Major depressive disorder, single episode, moderate: Secondary | ICD-10-CM

## 2016-06-02 NOTE — Assessment & Plan Note (Addendum)
No PHQ-9 today.  She continues to have a bit of difficulty describing her mood symptoms.  She denies anxiety today (other than feeling jittery inside) but appears anxious.  Smiles on occasion - fully.  Laughs.  Affect within normal range in this regard.  No evidence of SI / HI.    I am concerned with the multitude of symptoms she presents with and wonder if this might be worth exploring.  I also wonder about Topamax and if this might be contributing to "lethargy" or other symptoms she was concerned about.  This medication has been on board for a time though without these issues.  Communicated thoughts to PCP.  Patient is currently without insurance.  Will follow in [place].  There is no charge for this service.  Provided her info for financial assistance.  Formal counseling, formal exercise, and increase in medication are options she is currently NOT interested in pursuing.  Interested in pursuing options as detailed in patient instructions.

## 2016-06-02 NOTE — Telephone Encounter (Signed)
This sounds like a great plan.  She will be seen as part of Integrated Care so there is no cost for the visit.

## 2016-06-02 NOTE — Progress Notes (Signed)
Reason for follow-up:  Patient was seen about a year ago for stress and mood symptoms.  Reluctant to start medications.  Integrated Care treatment was minimally effective.  Referral made to [facility] for more traditional therapy.  Patient had trouble with phone tag with them.  She did decide to start medicine - Fluoxetine 20 mg and noticed an improvement.  Issues discussed:   Last month or two has had an increase in anxiety.  Feels jittery.  Has had a 1-2 week period of time where she felt lethargic, "out of it," and wondered if she could safely operate a vehicle.  She also reports nausea (not vomiting), light headedness, one period of time with ringing in her left ear, her "face feeling different," and flashes or dots in her vision.  Appetite is reported normal.  Able to get to sleep but has some difficulty staying asleep.  Mood is sad and scared.  She has noticed a downturn in mood and feels frustrated and fearful that she will return to where she was a year ago.    Function at home is affected - not playing as much with her kids.  Falls asleep at desk.  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  He is supportive of her.    Identified goals:  Use Calm App.  Explore Yoga options at home or via a class schedule permitting.  Discussed medication increase - not interested.  Formal exercise used at an antidepressant dose would be really hard to do given work and home schedule.

## 2016-06-02 NOTE — Patient Instructions (Signed)
It was good to see you today.  Please schedule a follow-up for:  October 6th at 8:30.    We talked about several options to get you headed in a better direction:  1.  Yoga:  [employer].  Ask for the yoga person's name.  She does classes Monday and Wednesday at 6:00.  Phone: [phone].  2.  Calm App:  You said you thought you could do three times a day 3-5 minutes each time.   3.  I am going to touch base with Dr. Juanito Doom and brainstorm other options as well.

## 2016-06-13 ENCOUNTER — Other Ambulatory Visit: Payer: Self-pay | Admitting: Family Medicine

## 2016-06-16 ENCOUNTER — Ambulatory Visit (INDEPENDENT_AMBULATORY_CARE_PROVIDER_SITE_OTHER): Payer: Self-pay | Admitting: Psychology

## 2016-06-16 DIAGNOSIS — F321 Major depressive disorder, single episode, moderate: Secondary | ICD-10-CM

## 2016-06-16 DIAGNOSIS — F430 Acute stress reaction: Secondary | ICD-10-CM

## 2016-06-16 NOTE — Progress Notes (Signed)
Reason for follow-up:  Patient recently noticed a return of depressive and anxiety symptoms.    Issues discussed:  Had an anxiety attack at work a week ago Monday.  She had been feeling "off" and then got an upsetting text that pushed her over the edge.  Her husband picked her up and she spent the rest of the day at home.  The rest of the week she felt guilty about leaving work and still felt off.  She went to work the rest of that week but was functioning at a significantly lower level.  She has a lot of guilt about her function at home and at work.  Sister was recently diagnosed with Bipolar Disorder.  She is in a bad psychosocial situation and Alejandra Conway is her main source of support.  Alejandra Conway said she learned to separate herself from her mom's problems (including violent relationships) but has a harder time creating / maintaining boundaries with her sister despite the fact her sister lives in [place].  Dad has been diagnosed with paranoid schizophrenia.  At session end, she reported feeling alarmed about a man in a truck near her at [place].  She dismissed it initially but noticed him driving near her several times (in conditions that seemed suspicious to her).  She wonder  Identified goals:  She plans to revamp her resume and has here eye on four jobs in [employer] firms that will hopefully be a better match for her than her current employer.  Might also afford her health insurance.    Continue with belly breathing.  Thought record provided.

## 2016-06-16 NOTE — Assessment & Plan Note (Signed)
Symptoms sound consistent with a panic attack although she has some residual physical symptoms and some that don't seem temporally associated with a panic attack that are perplexing.  She felt "drugged" and "out of it" even four days after her panic attack.    Noted the positive MDQ in the overview.  She believes her sister's diagnosis of Bipolar Disorder is accurate.  She also believes Prozac has been helpful.  Does not seem to have pushed her mood in a negative direction but I question whether it is the most effective medicine to treat her mood.  She is very ambivalent about medication so hesitate to make any changes.  Will continue to monitor.

## 2016-06-16 NOTE — Patient Instructions (Signed)
Please schedule a follow-up for:  October 27th at 8:30.  Call me in the mean time if you experience a worsening of symptoms.  [phone].  Following up with the financial people might allow [place] to dig a little deeper into your symptoms.  Continue with deep breathing (belly breathing) as frequently as you can.  The Breathe app isn't necessary.  Just make sure you are breathing from your belly - not your chest.  A count of 4 in and 4 out through your nose can be helpful.  Thought record.  I have given you one to road test.  If you are experiencing high emotion, consider filling the thought record out.  Next time we meet, let's focus on guilt and how you manage it.

## 2016-06-16 NOTE — Assessment & Plan Note (Signed)
Normal range of affect.  Continues to have difficulty with word finding / expressing herself in a way that Alejandra Conway feels understood.  #9 on PHQ-9 was a 1 (several days).  Discussed.  No active ideation or plan.  Alejandra Conway had thoughts like, "What is the point?" when Alejandra Conway had such a tough time at her job.  Discussed how that thought comes at a time when Alejandra Conway is emotionally overloaded.  Challenges thoughts like that would likely be useful.  Additionally, time passing usually means a decrease in emotion and a subsequent shift in thinking as well.

## 2016-07-07 ENCOUNTER — Ambulatory Visit (INDEPENDENT_AMBULATORY_CARE_PROVIDER_SITE_OTHER): Payer: Self-pay | Admitting: Psychology

## 2016-07-07 DIAGNOSIS — F321 Major depressive disorder, single episode, moderate: Secondary | ICD-10-CM

## 2016-07-07 NOTE — Progress Notes (Signed)
Reason for follow-up:  Continued treatment of depressed mood.  Issues discussed:  She continues to have significant symptoms of depression and they are getting worse, not better.  She is oversleeping, frequently late, having difficulty cooking meals and playing with her kids.  Also continues with somatic symptoms.  Cites palpitations today.  Also mentions two episodes of sleep paralysis recently.    Identified goals:  She is doing a fair amount of journaling.  The thought record was a bit confusing for her.  Asked her to consider a different approach with the idea of setting limits around her negative thoughts.  Attend [place] appointment.

## 2016-07-07 NOTE — Patient Instructions (Signed)
I scheduled for our mood clinic on November 22nd at 10:00.  Dr. Tammi Klippel is the physician that I work with.  I think it would be good for him to hear about your symptoms.  Also, please schedule a follow-up for integrated care on November 10th at 8:30.    We looked at another way to track your thoughts.  Hopefully this will be easier.  There are some good books - Mind over Mood [facility]) or the "Feeling Good" book by Quay Burow (??) might be good.

## 2016-07-07 NOTE — Assessment & Plan Note (Signed)
Report of mood is depressed.  Affect is grossly within normal limits.  She does get tearful on occasion without much prompting.  She mentioned reading a book about bipolar disorder and thinking her mom fit one of the scenarios well.  I suggested that perhaps her medicine is not the best one to treat her symptoms, given her genetics.  She has been ambivalent about medicine in the past but stated she would be open to medication recommendations.  Mostly - I want another set of eyes on her.  I think Dr. Tammi Klippel will be a good one to take into account her multiple somatic symptoms coupled with some significant mood issues.    Of note, the journaling she had was quite a bit.  She would get started writing and then just keep going (per her report).  Her speech was also somewhat pressured upon presentation today.  It might have been anxiety given her report of palpitations although she denied feeling anxious.    Will follow in [place] first available which is 11/22.  Will follow up in two weeks with her.  No active SI reported today.

## 2016-07-17 ENCOUNTER — Ambulatory Visit: Payer: Self-pay | Attending: Internal Medicine

## 2016-07-21 ENCOUNTER — Ambulatory Visit (INDEPENDENT_AMBULATORY_CARE_PROVIDER_SITE_OTHER): Payer: Self-pay | Admitting: Psychology

## 2016-07-21 DIAGNOSIS — F321 Major depressive disorder, single episode, moderate: Secondary | ICD-10-CM

## 2016-07-21 NOTE — Patient Instructions (Signed)
I look forward to seeing you in our clinic on November 22nd at 10:00.  Until then, please continue with the strategies we have talked about in the past.

## 2016-07-21 NOTE — Assessment & Plan Note (Signed)
Reports mood has leveled off since last visit.  Not dipping down further but not appreciably better.  No missed days from work since last appointment.  "Fought through the fatigue" this past weekend and "purged" the house.  This sounds like a relative burst of energy and goal directed activity.  She has difficulty describing things sometimes so it is hard to say.    She asked about "perfectionism."  We discussed the difference between OCD and OCPD traits.  She might have some of the latter but it didn't sound significant enough to render it "disordered."    She remains ambivalent (strongly hesitant) about medication.  We will need to be mindful of this for her [facility] appointment.  Thought record exercise proved frustrating.  As best I can tell, she is mad she has the thoughts she has and can't "control" her mind.  Attempted to educate on how managing her thoughts is hard work initially that often gets easier with practice and that she does have some measure of control.  She is not confident this is true.    [place] appointment 08/02/16.

## 2016-07-21 NOTE — Progress Notes (Signed)
Reason for follow-up:  Patient presents for continued mood management.    Issues discussed:  She disclosed that her husband is an undocumented immigrant.  They are working on a waiver to allow him to stay in the country but this likely entails an interview (in [place]) and perhaps living in [place] for a period of time.  She also disclosed some background on other stressors in their marriage.  Identified goals:  Attend [facility] appointment.  Discussed increasing social connections but presently, there are significant barriers to this.

## 2016-08-02 ENCOUNTER — Ambulatory Visit (INDEPENDENT_AMBULATORY_CARE_PROVIDER_SITE_OTHER): Payer: Self-pay | Admitting: Psychology

## 2016-08-02 DIAGNOSIS — F321 Major depressive disorder, single episode, moderate: Secondary | ICD-10-CM

## 2016-08-02 NOTE — Patient Instructions (Signed)
Please follow up on December 6th at 10:30.  We will talk about your medication and how you are feeling.  There is a workbook for the Feeling Good book.  You may want to check that out.  We can also talk about boundaries and how you can set some for yourself and those in your family.    Call me in between if anything comes up.

## 2016-08-02 NOTE — Progress Notes (Signed)
Reason for follow-up:  Continued attempts at better resolution of mood and anxiety symptoms.  Seen as part of Integrated Care service.  Issues discussed:  Reviewed history, family history, psychosocial stressors, and current symptoms / function. Focused a lot on her marital relationship and her sense of self independent of being a wife and mother.     Identified goals:  Augment 20 mg of Prozac with 100 mg of Wellbutrin to try remit depressive symptoms.  Seize at least one opportunity weekly to care for herself (e.g. Hike, time by herself).  Will need to work on emotional boundary setting.

## 2016-08-02 NOTE — Assessment & Plan Note (Addendum)
[name] remains a bit difficult to understand.  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  She has a preference for order and is a devoted planner / Licensed conveyancer.  She has difficulty tolerating behaviors in her spouse and children that get in the way of order but recognizes that the children's behavior is within normal limits.  Sounds like untriggered anxiety attacks have gotten better.  Irritability, depressed mood, apathy, anhedonia remain as well as some passive SI.  Dr. Tammi Klippel thought increasing the prozac or augmenting with wellbutrin were reasonable options.  Discussed pros and cons and patient agreed to adding wellbutrin.  As a young married with children, we were wanting to be mindful of sexual side effects.    Provided feedback on passivity when it comes to taking care of herself.  This is where the boundary discussion came up.  Agreed to take a closer look at this next visit.  Also - loaned book Feeling Good by Judie Petit.  See patient instructions for further plan.

## 2016-08-11 ENCOUNTER — Ambulatory Visit: Payer: Medicaid Other

## 2016-08-11 ENCOUNTER — Telehealth: Payer: Self-pay | Admitting: Psychology

## 2016-08-11 NOTE — Telephone Encounter (Signed)
Patient missed follow-up Integrated Care appointment.  Was started on a new medicine during a recent [place] visit.  Called to check-in.  Left a generic VM asking her to return my call.

## 2016-08-14 ENCOUNTER — Telehealth: Payer: Self-pay | Admitting: Family Medicine

## 2016-08-16 ENCOUNTER — Ambulatory Visit (INDEPENDENT_AMBULATORY_CARE_PROVIDER_SITE_OTHER): Payer: Self-pay | Admitting: Psychology

## 2016-08-16 DIAGNOSIS — F321 Major depressive disorder, single episode, moderate: Secondary | ICD-10-CM

## 2016-08-16 NOTE — Assessment & Plan Note (Signed)
Depressed mood several days over the past two weeks.  No evidence of SI / HI.  Has difficulty warming up - doesn't know what she is going to talk about but ends up being fairly talkative.  Seems to have a very conflicted view of her husband.  First time I heard her describe him as a narcissist.  She says she recognizes she isn't going to change him and is interested in how she can change her reaction to him.  Agreed to discuss next visit.  See patient instructions for further plan.

## 2016-08-16 NOTE — Progress Notes (Signed)
Reason for follow-up:  Patient is seen as part of integrated care program.  Was started on Wellbutrin last [facility] visit.  Follow-up to see how her mood symptoms are being managed.    Issues discussed:  Patient reports she is taking the Wellbutrin - missing some evening doses.  Tolerating fine with the exception of some increased difficulty sleeping.  Thinks she is in more of a "mellow" state, less sad, and more energy.  PHQ-9 is unchanged from two weeks ago.    Patient thinks husband might be a narcissist.  Thinks that she was happier when she emotionally and physically "cut him off."  Despite continuing to live in the same house, she felt "empowered" and happier when she was focusing more on herself and the boys.    Looked at options other than medicine to help manage mood.  She continues to have difficulty carving out time for herself.  Social support is not good right now.  Two friends have problems of their own making connection difficult and her mom is not a good option.  Providing community service in some way has been helpful to her in the past.    Continues to "purge" her house (garage this time).  Not exercising.  Identified goals:  Take 300 mg of Wellbutrin in the morning.  Identify specific things for a more ideal life.

## 2016-08-16 NOTE — Patient Instructions (Signed)
Please schedule a follow-up for for Mood Clinic:  January 3rd at 10:30.  Dr. Tammi Klippel recommended you increase your Wellbutrin to 300 mg and you can take it in the morning.  He wrote a new script so you can take just one pill in the morning.  You are discovering some important things about your social situation.  It presents you with some difficult choices.  Let's talk more about these.  Please schedule a follow-up for:  December 15th at 9:00 for [place] with Dr. Gwenlyn Saran.

## 2016-08-25 ENCOUNTER — Ambulatory Visit (INDEPENDENT_AMBULATORY_CARE_PROVIDER_SITE_OTHER): Payer: Self-pay | Admitting: Psychology

## 2016-08-25 DIAGNOSIS — F321 Major depressive disorder, single episode, moderate: Secondary | ICD-10-CM

## 2016-08-25 NOTE — Assessment & Plan Note (Signed)
Report of mood is euthymic.  Affect is consistent.  Thought process remains a bit fragmented.  She contradicts herself on occasion which I think might reflect ambivalence or perhaps a difference between who she pretends to be and who she really is or who she is and who she wants to be.    Options for marital relationship (at least three - likely more):  Stay in the relationship even though there is no emotional connection (business of raising a family); leave the relationship; attempt to cultivate an emotional connection / better marital relationship.  She quickly said the last two were not possible.  Discussed the risks / benefits of staying in a relationship with no emotional connection.  Lots of people do it and get emotional needs met elsewhere - she does not have a support system that would help with this.    She keeps going back to this period of time where she "cut him off" emotionally and felt empowered.  I asked her to consider what that looked like specifically and what the risks / benefits would be to doing that again (if possible).  I also suggested that no pill would fix her feelings of loneliness; she is hard-wired for connection and she isn't getting her needs met.    Follow-up [facility] January 3rd.  Will schedule follow-up therapy visit after if desired.

## 2016-08-25 NOTE — Progress Notes (Signed)
Reason for follow-up:  Patient presented for follow-up for mood management.  Issues discussed:  Taking medicine as prescribed.  Has switched the second dose to before 3:00 and this has helped her sleep.  Sometimes forgets and then does not take if it is after 3:00.  Thinks the medicine has helped her think more clearly.  No PHQ-9 given today (forgot).  Bulk of the meeting focused on her feelings regarding her relationship.  She described herself as the [place] with herself as the smallest one and covered up by all of the others.  She identifies most with being a mother.  Has never really identified as being a wife.  Describes herself as being "outside" her whole life.  Could find her way in if she tried but it was never a good match.    Identified goals:  Consider options for supporting mental health outside of medicine.

## 2016-09-01 ENCOUNTER — Other Ambulatory Visit: Payer: Self-pay | Admitting: Family Medicine

## 2016-09-01 DIAGNOSIS — G43119 Migraine with aura, intractable, without status migrainosus: Secondary | ICD-10-CM

## 2016-09-01 DIAGNOSIS — Z3009 Encounter for other general counseling and advice on contraception: Secondary | ICD-10-CM

## 2016-09-13 ENCOUNTER — Ambulatory Visit (INDEPENDENT_AMBULATORY_CARE_PROVIDER_SITE_OTHER): Payer: Self-pay | Admitting: Psychology

## 2016-09-13 ENCOUNTER — Telehealth: Payer: Self-pay | Admitting: Family Medicine

## 2016-09-13 DIAGNOSIS — G43119 Migraine with aura, intractable, without status migrainosus: Secondary | ICD-10-CM

## 2016-09-13 DIAGNOSIS — F321 Major depressive disorder, single episode, moderate: Secondary | ICD-10-CM

## 2016-09-13 DIAGNOSIS — Z3009 Encounter for other general counseling and advice on contraception: Secondary | ICD-10-CM

## 2016-09-13 NOTE — Assessment & Plan Note (Signed)
Report of mood is euthymic today.  Subjective report of symptoms is improved.  PHQ-9 score is 11 but symptoms are not noted to interfere with function.  Patient states that she has again decided to emotionally disconnect with her husband.  This helps her with expectations and trust.  Treatment team is not sure if this is a sustainable strategy but it seems to have positively impacted her mood before.    She elected to follow in about four weeks for individual counseling.  Will call sooner if needed.

## 2016-09-13 NOTE — Patient Instructions (Signed)
Please schedule a follow-up for 1/26th at 8:30 to see Dr. Gwenlyn Saran.  When you pick up your new script for Wellbutrin, it should be one pill in the morning.  Keep the Prozac the same.  Follow-up in [facility] for 3/7 at 9:00.  I think the book would be good for you.  It really teaches you to be mindful of your thinking and how it is working for or against you.  Would be excited to hear how you do with it.

## 2016-09-13 NOTE — Telephone Encounter (Signed)
Patient request refill on birth control RX & RX for migraines. Please let patient know once complete.

## 2016-09-13 NOTE — Progress Notes (Signed)
Reason for follow-up:  Follow-up on medications and mood.    Issues discussed:  Patient reports taking 300 mg of Wellbutrin and 20 mg of Prozac daily.  Infrequently misses second Wellbutrin dose.  No reported side effects from medicine with the exception of some throat tightening when she takes her medication.  Mood is reported as improved since last visit with more energy, a resurgence of creativity with her boys, some time to herself without feeling guilty, and some improvement in self-esteem.

## 2016-09-15 MED ORDER — TOPIRAMATE 25 MG PO CPSP: 25.0000 mg | ORAL_CAPSULE | Freq: Every day | ORAL | 5 refills | Status: DC

## 2016-09-15 MED ORDER — NORGESTIMATE-ETH ESTRADIOL 0.25-35 MG-MCG PO TABS: 1.0000 | ORAL_TABLET | Freq: Every day | ORAL | 12 refills | Status: DC

## 2016-09-15 NOTE — Telephone Encounter (Signed)
Prescriptions sent to pharmacy

## 2016-10-06 ENCOUNTER — Ambulatory Visit (INDEPENDENT_AMBULATORY_CARE_PROVIDER_SITE_OTHER): Payer: Self-pay | Admitting: Psychology

## 2016-10-06 DIAGNOSIS — Z3009 Encounter for other general counseling and advice on contraception: Secondary | ICD-10-CM

## 2016-10-06 DIAGNOSIS — F321 Major depressive disorder, single episode, moderate: Secondary | ICD-10-CM

## 2016-10-06 NOTE — Progress Notes (Signed)
Reason for follow-up:  Continued treatment of mood and anxiety.  Issues discussed:  Birth control.  Patient thinks she has noticed a pattern where her symptoms are worse when she is taking OCPs.  She was off for about a month or two and noticed increase in energy level, mood, goal directed activity.  When she restarted, she deteriorated.  She would like to stop the pills again and see if it makes a difference.  Discussed risk / benefit including the possibility of getting pregnant.    Relationships:  She remains somewhat emotionally and physically cut off from her husband and is okay with this decision for now.  Her greatest sense of staisfaction / pleasure currently comes from being a mom and interacting with her children.  However, she did go out with a female coworker recently and enjoyed this experience (without guilt).  She was excited to report this.

## 2016-10-06 NOTE — Assessment & Plan Note (Signed)
Patient plans on stopping OCPs.  Husband wants a vasectomy but wants to wait until they go to [place] to get it secondary to cost.  She does not want to get pregnant.  States they typically use the pull-out method coupled with the OCPs.  Stressed that the pull-out method was not a reliable form of birth control in isolation.  She voiced an understanding.  Encouraged her to talk to Dr. Juanito Doom about other alternatives if she discovers that the OCPs do have a consistent effect on mood and function.

## 2016-10-06 NOTE — Patient Instructions (Signed)
I will see you on March 7th at 9:00 in our mood clinic.  You can call in the mean time if you need to.  Good luck with your experiment.

## 2016-10-06 NOTE — Assessment & Plan Note (Signed)
She had a period of relative return to baseline when she was off of her OCPs.  Currently, she is feeling better than she has in the past but notices herself slipping back (since restarting OCPs).  She plans to stop them and see what happens.  Continues to take Wellbutrin and Fluoxetine as prescribed.  Overall, report of mood and function is improved.  She elected to follow as scheduled in [facility] (March).  She does not feel a need to come in sooner.  Also of note, she reading self-help books, looking at [employer] and learning some helpful things.

## 2016-11-15 ENCOUNTER — Ambulatory Visit (INDEPENDENT_AMBULATORY_CARE_PROVIDER_SITE_OTHER): Payer: Self-pay | Admitting: Psychology

## 2016-11-15 DIAGNOSIS — F430 Acute stress reaction: Secondary | ICD-10-CM

## 2016-11-15 DIAGNOSIS — F321 Major depressive disorder, single episode, moderate: Secondary | ICD-10-CM

## 2016-11-15 NOTE — Patient Instructions (Signed)
Please schedule a follow-up for:  April 18th at 9:30.    We will take a look at the paperwork and see you back.  Sounds like you are doing a good job navigating your relationship.  Let me know if you want to schedule an appointment to talk.  [phone].

## 2016-11-15 NOTE — Assessment & Plan Note (Deleted)
PHQ-9 was 9 today. This is better by nearly half from initial presentation.  Her voiced bothersome symptoms was an inability to follow through on projects / get things done.  Dr. Tammi Klippel wondered about ADHD - -predominately inattentive.  She completed an Adult ADHD self-report scale with 9/18 endorsements.  This will be scanned in.  Will follow up with this next visit with a repeat of this measure as well as a GAD.  Depressive symptoms seem to be moderately controlled with some room on anhedonia.  Denied any depressed mood in the past two weeks.  Home situation is a challenge.  She feels comfortable with how she is handling it presently.  Is making plans and following through.  There might be a seasonal component to her anhedonia so it will be good to reassess next visit.

## 2016-11-15 NOTE — Progress Notes (Signed)
Reason for follow-up:  Continued management of mood and anxiety.    Issues discussed:  Has been off of OCPs.  Thinks it has been a positive change. Reports difficulty with motivation, task completion, staying focused.  Function has leveled off a bit.  Continues to play with kids but is not as energetic / engaged as she has been when at her best ist he past.  Relationship with husband has continued to be a challenge.  Is more assertive now and feels good about this.

## 2016-11-15 NOTE — Assessment & Plan Note (Signed)
PHQ-9 was 9 today. This is better by nearly half from initial presentation.  Her voiced bothersome symptoms was an inability to follow through on projects / get things done.  Dr. Tammi Klippel wondered about ADHD - -predominately inattentive.  She completed an Adult ADHD self-report scale with 9/18 endorsements.  This will be scanned in.  Will follow up with this next visit with a repeat of this measure as well as a GAD.  Depressive symptoms seem to be moderately controlled with some room on anhedonia.  Denied any depressed mood in the past two weeks.  Home situation is a challenge.  She feels comfortable with how she is handling it presently.  Is making plans and following through.  There might be a seasonal component to her anhedonia so it will be good to reassess next visit.

## 2016-12-27 ENCOUNTER — Ambulatory Visit (INDEPENDENT_AMBULATORY_CARE_PROVIDER_SITE_OTHER): Payer: Self-pay | Admitting: Psychology

## 2016-12-27 DIAGNOSIS — Z3009 Encounter for other general counseling and advice on contraception: Secondary | ICD-10-CM

## 2016-12-27 DIAGNOSIS — F321 Major depressive disorder, single episode, moderate: Secondary | ICD-10-CM

## 2016-12-27 NOTE — Assessment & Plan Note (Signed)
Patient has difficulty reporting on mood or an agenda at the beginning of a visit.  Affect is bright.  Thoughts are clear and goal directed.  Speech is a little accelerated.  She is on board with continuing to take medicine regularly (says the "forgetting" was not intentional or an experiment) and see how she does over the next month or two.    Will be interested to see how she does with plans.  Dr. Tammi Klippel wondered whether giving her the TEMPS-A to see if her temperament might be playing into this.  She reports her sister was diagnosed with Bipolar Disorder.

## 2016-12-27 NOTE — Assessment & Plan Note (Signed)
Reviewed current contraceptive method (withdrawal).  She is not interested in an IUD and discontinued OCP secondary to thinking they made her mood worse. She plans to continue with her current method.

## 2016-12-27 NOTE — Progress Notes (Signed)
Reason for follow-up:  Continued mood management.    Issues discussed:  Had consistent difficulty taking medicine for about two to 2.5 weeks recently.  Noticed increased agitation and thoughts about self-harm (cutting, not suicidal ideation).  Chose not to cut.  About a week ago started taking the medicine consistently and is feeling better.  She has been in a period of relatively low motivation but has ideas about going back to school, getting a new job (has interviewed three places), attending a "[employer]."  She states she has these ideas, she gets excited about them and then they don't go anywhere.  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

## 2016-12-27 NOTE — Patient Instructions (Addendum)
Please schedule a follow-up for:  June 20th at 9:00.  Continue taking medicines daily for the best benefit.  Exercise, as you know, has benefits for your body and your mind.  Writing your goals down and reviewing them regularly may help you stay on track.    We continue to recommend a reliable birth control method to help ensure you don't have an unintended pregnancy.

## 2017-02-28 ENCOUNTER — Ambulatory Visit: Payer: Medicaid Other | Admitting: Psychology

## 2017-03-13 ENCOUNTER — Other Ambulatory Visit: Payer: Self-pay | Admitting: Family Medicine

## 2017-04-30 ENCOUNTER — Telehealth: Payer: Self-pay | Admitting: Psychology

## 2017-04-30 NOTE — Telephone Encounter (Signed)
Patient left a VM last week while I was on vacation.  Called her back and left a VM.

## 2017-09-11 NOTE — L&D Delivery Note (Signed)
Delivery Note Progressed quickly to complete dilation with urge to push.  At 5:15 PM a viable and healthy female was delivered via Vaginal, Spontaneous (Presentation:OA ).  APGAR: 8, 9; weight 7 lb 5.5 oz (3330 g).   Placenta status: spontaneous and grossly intact with 3 vessel Cord:  with the following complications: .Hand cord    Anesthesia:  Local with repair Episiotomy:  none Lacerations:  1st degree midline perineal Suture Repair: 3.0 vicryl rapide Est. Blood Loss (mL):  200  Mom to postpartum.  Baby to Couplet care / Skin to Skin.  Alejandra Conway 06/17/2018, 6:07 PM  Please schedule this patient for Postpartum visit in: 4 weeks with the following provider: Any provider For C/S patients schedule nurse incision check in weeks 2 weeks: no Low risk pregnancy complicated by: none Delivery mode:  SVD Anticipated Birth Control:  vasectomy PP Procedures needed: none  Schedule Integrated BH visit: yes  Hx depression and anxiety

## 2017-10-08 ENCOUNTER — Encounter: Payer: Self-pay | Admitting: Family Medicine

## 2017-10-08 ENCOUNTER — Other Ambulatory Visit: Payer: Self-pay

## 2017-10-08 ENCOUNTER — Ambulatory Visit (INDEPENDENT_AMBULATORY_CARE_PROVIDER_SITE_OTHER): Payer: Self-pay | Admitting: Family Medicine

## 2017-10-08 VITALS — BP 110/68 | HR 75 | Temp 98.5°F | Ht 62.0 in | Wt 137.4 lb

## 2017-10-08 DIAGNOSIS — Z3200 Encounter for pregnancy test, result unknown: Secondary | ICD-10-CM

## 2017-10-08 DIAGNOSIS — Z3493 Encounter for supervision of normal pregnancy, unspecified, third trimester: Secondary | ICD-10-CM | POA: Insufficient documentation

## 2017-10-08 DIAGNOSIS — Z3490 Encounter for supervision of normal pregnancy, unspecified, unspecified trimester: Secondary | ICD-10-CM

## 2017-10-08 LAB — POCT URINE PREGNANCY: Preg Test, Ur: POSITIVE — AB

## 2017-10-08 NOTE — Progress Notes (Signed)
   Subjective:    Patient ID: Alejandra Conway , female   DOB: 1986-01-08 , 32 y.o..   MRN: [id number]  HPI  [place] Vallier is a 32 yo F with PMH of intermittent asthma and migraines here for  Chief Complaint  Patient presents with  . Possible Pregnancy    1. Pregnancy Test: Patient notes that she took 7 home pregnancy tests, some were positive and some were negative. Her LMP was 08/21/18. She notes that this was an unplanned but welcomed pregnancy. She is not currently taking any medications.   Review of Systems: Per HPI.   Past Medical History: Patient Active Problem List   Diagnosis Date Noted  . Supervision of normal pregnancy 10/08/2017  . Migraine 11/16/2015  . Depression, Moderate 05/13/2015  . Stress induced anxiety 05/04/2015  . Chest pain 10/01/2014  . Migraine without aura 12/09/2013  . ABNORMAL PAP SMEAR, LGSIL 11/25/2009  . PALPITATIONS 01/01/2008  . ASTHMA, INTERMITTENT 11/08/2006    Medications: reviewed and updated  Social Hx:  reports that  has never smoked. she has never used smokeless tobacco.   Objective:   BP 110/68   Pulse 75   Temp 98.5 F (36.9 C) (Oral)   Ht 5\' 2"  (1.575 m)   Wt 137 lb 6.4 oz (62.3 kg)   LMP 08/21/2017 (Approximate)   SpO2 99%   BMI 25.13 kg/m  Physical Exam  Gen: NAD, alert, cooperative with exam, well-appearing Psych: good insight, normal mood and affect  Results for orders placed or performed in visit on 10/08/17  POCT urine pregnancy  Result Value Ref Range   Preg Test, Ur Positive (A) Negative     Assessment & Plan:  Supervision of normal pregnancy Positive pregnancy test today. Unplanned but welcomed pregnancy.  - Begin prenatal vitamins - Collect OB labs - Avoid NSAIDs for migraines and use Tylenol instead - Follow up in 1-2 weeks for 1st OB visit  Orders Placed This Encounter  Procedures  . Culture, OB Urine    Standing Status:   Future    Standing Expiration Date:   10/08/2018  . Obstetric  Panel, Including HIV    Standing Status:   Future    Standing Expiration Date:   10/08/2018  . POCT urine pregnancy    Smitty Cords, MD [place], PGY-3

## 2017-10-08 NOTE — Patient Instructions (Addendum)
Thank you for coming in today, it was so nice to see you! Today we talked about:    Congratulations on being pregnant!!!  We will need to see you to obtain blood work and for your 1st OB appointment   Take prenatal vitamins every day  [facility] if you have any vaginal bleeding or abdominal pain   If you have any questions or concerns, please do not hesitate to call the office at [phone]. You can also message me directly via MyChart.   Sincerely,  Smitty Cords, MD

## 2017-10-08 NOTE — Assessment & Plan Note (Signed)
Positive pregnancy test today. Unplanned but welcomed pregnancy.  - Begin prenatal vitamins - Collect OB labs - Avoid NSAIDs for migraines and use Tylenol instead - Follow up in 1-2 weeks for 1st OB visit

## 2017-10-09 ENCOUNTER — Other Ambulatory Visit (INDEPENDENT_AMBULATORY_CARE_PROVIDER_SITE_OTHER): Payer: Self-pay

## 2017-10-09 DIAGNOSIS — Z3490 Encounter for supervision of normal pregnancy, unspecified, unspecified trimester: Secondary | ICD-10-CM

## 2017-10-09 LAB — POCT URINALYSIS DIP (MANUAL ENTRY)
BILIRUBIN UA: NEGATIVE
GLUCOSE UA: NEGATIVE mg/dL
Ketones, POC UA: NEGATIVE mg/dL
Nitrite, UA: NEGATIVE
Protein Ur, POC: NEGATIVE mg/dL
RBC UA: NEGATIVE
Spec Grav, UA: 1.025 (ref 1.010–1.025)
Urobilinogen, UA: 0.2 E.U./dL
pH, UA: 6 (ref 5.0–8.0)

## 2017-10-09 LAB — POCT UA - MICROSCOPIC ONLY

## 2017-10-10 LAB — OBSTETRIC PANEL, INCLUDING HIV
Antibody Screen: NEGATIVE
BASOS ABS: 0 10*3/uL (ref 0.0–0.2)
Basos: 0 %
EOS (ABSOLUTE): 0.1 10*3/uL (ref 0.0–0.4)
EOS: 2 %
HEMOGLOBIN: 14.1 g/dL (ref 11.1–15.9)
HIV Screen 4th Generation wRfx: NONREACTIVE
Hematocrit: 41.2 % (ref 34.0–46.6)
Hepatitis B Surface Ag: NEGATIVE
IMMATURE GRANS (ABS): 0 10*3/uL (ref 0.0–0.1)
IMMATURE GRANULOCYTES: 0 %
LYMPHS ABS: 1.9 10*3/uL (ref 0.7–3.1)
LYMPHS: 34 %
MCH: 29.9 pg (ref 26.6–33.0)
MCHC: 34.2 g/dL (ref 31.5–35.7)
MCV: 88 fL (ref 79–97)
Monocytes Absolute: 0.4 10*3/uL (ref 0.1–0.9)
Monocytes: 6 %
NEUTROS PCT: 58 %
Neutrophils Absolute: 3.2 10*3/uL (ref 1.4–7.0)
PLATELETS: 222 10*3/uL (ref 150–379)
RBC: 4.71 x10E6/uL (ref 3.77–5.28)
RDW: 13.5 % (ref 12.3–15.4)
RPR Ser Ql: NONREACTIVE
Rh Factor: POSITIVE
Rubella Antibodies, IGG: 1.05 index (ref >0.99)
WBC: 5.6 10*3/uL (ref 3.4–10.8)

## 2017-10-13 LAB — URINE CULTURE, OB REFLEX

## 2017-10-13 LAB — CULTURE, OB URINE

## 2017-10-22 ENCOUNTER — Encounter: Payer: Self-pay | Admitting: Family Medicine

## 2017-10-22 ENCOUNTER — Other Ambulatory Visit (HOSPITAL_COMMUNITY)
Admission: RE | Admit: 2017-10-22 | Discharge: 2017-10-22 | Disposition: A | Discharge status: Home / Self Care | Payer: Self-pay | Source: Ambulatory Visit | Attending: Family Medicine | Admitting: Family Medicine

## 2017-10-22 ENCOUNTER — Ambulatory Visit (INDEPENDENT_AMBULATORY_CARE_PROVIDER_SITE_OTHER): Payer: Self-pay | Admitting: Family Medicine

## 2017-10-22 ENCOUNTER — Other Ambulatory Visit: Payer: Self-pay

## 2017-10-22 VITALS — BP 98/66 | HR 80 | Temp 98.4°F | Wt 137.0 lb

## 2017-10-22 DIAGNOSIS — Z113 Encounter for screening for infections with a predominantly sexual mode of transmission: Secondary | ICD-10-CM | POA: Insufficient documentation

## 2017-10-22 DIAGNOSIS — Z3491 Encounter for supervision of normal pregnancy, unspecified, first trimester: Secondary | ICD-10-CM

## 2017-10-22 NOTE — Progress Notes (Signed)
Alejandra Conway is a 32 y.o. yo [id number] at [redacted]w[redacted]d who presents for her initial prenatal visit. Pregnancy  is not planned, but welcomed  She reports breast tenderness, fatigue, frequent urination, morning sickness and nausea. She  is taking PNV. She has had some nausea no vomiting  See flow sheet for details.  PMH, POBH, FH, meds, allergies and Social Hx reviewed.  Prenatal exam: Gen: Well nourished, well developed.  No distress.  Vitals noted. HEENT: Normocephalic, atraumatic.  Neck supple without cervical lymphadenopathy, thyromegaly or thyroid nodules.  fair dentition. CV: RRR no murmur, gallops or rubs Lungs: CTAB.  Normal respiratory effort without wheezes or rales. Abd: soft, NTND. +BS.  Uterus not appreciated above pelvis. GU: Normal external female genitalia without lesions.  Nl vaginal, well rugated without lesions. No vaginal discharge.  Bimanual exam: External genitalia within normal limits.  Vaginal mucosa pink, moist, normal rugae.  Cervix is friable without lesions, no discharge or bleeding noted on speculum exam.  Bimanual exam revealed normal, nongravid uterus.  No cervical motion tenderness. No adnexal masses bilaterally.   No adnexal mass or TTP. No CMT.  Unable to assess fundal height Ext: No clubbing, cyanosis or edema. Psych: Normal grooming and dress.  Not depressed or anxious appearing.  Normal thought content and process without flight of ideas or looseness of associations  Assessment/plan: 1) Pregnancy [redacted]w[redacted]d dated by LMP.  -Current pregnancy issues include: None -Dating is reliable -Prenatal labs reviewed, notable for bacteruria however urine culture with 25-50k CFU (insignificant growth). Also GBS+ -Bleeding and pain precautions reviewed. -Importance of prenatal vitamins reviewed.  -Genetic screening offered, she would like to think about it. -Early glucola is not indicated.  -Pap and GC/Chlamydia performed today. Cervix was friable on exam, will await  results. -Patient does not have insurance. Does not qualify for Medicaid per her report, has already spoken to [place]. Will reach out to [employer] and see if we can get her assistance there. -She has a history of Depression and stress induced anxiety. She admitted to having some symptoms mostly surrounding strain on her marriage but she did not want to discuss it further. Denied any SI/HI. Provided information for our behavioral health consultants. Will follow up with this at her next visit.  -Hx of asthma in chart but asymptomatic for years. Does not use Albuterol inhaler.  -Precepted with Dr. Walker Kehr.   Follow up 4 weeks.    Smitty Cords, MD [place], PGY-3

## 2017-10-22 NOTE — Patient Instructions (Addendum)
Thank you for coming in today, it was so nice to see you! Today we talked about:    1st OB visit: You are doing great! Keep taking your vitamins. You are 9 weeks and 1 day pregnant today.  Come to the [facility] hospital 1) Strong contractions every 2-3 minutes for at least 1 hour that do not go away when you drink water or take a warm shower. These contractions will be so strong all you can do is breath through them. 2) Vaginal bleeding- anything more than spotting  Please follow up in 4 weeks. You can schedule this appointment at the front desk before you leave or call the clinic.  If we ordered any tests today, you will be notified via telephone of any abnormalities. If everything is normal you will get a letter in the mail.   If you have any questions or concerns, please do not hesitate to call the office at [phone]. You can also message me directly via MyChart.   Sincerely,  Smitty Cords, MD    First Trimester of Pregnancy The first trimester of pregnancy is from week 1 until the end of week 13 (months 1 through 3). During this time, your baby will begin to develop inside you. At 6-8 weeks, the eyes and face are formed, and the heartbeat can be seen on ultrasound. At the end of 12 weeks, all the baby's organs are formed. Prenatal care is all the medical care you receive before the birth of your baby. Make sure you get good prenatal care and follow all of your doctor's instructions. Follow these instructions at home: Medicines  Take over-the-counter and prescription medicines only as told by your doctor. Some medicines are safe and some medicines are not safe during pregnancy.  Take a prenatal vitamin that contains at least 600 micrograms (mcg) of folic acid.  If you have trouble pooping (constipation), take medicine that will make your stool soft (stool softener) if your doctor approves. Eating and drinking  Eat regular, healthy meals.  Your doctor will tell you the  amount of weight gain that is right for you.  Avoid raw meat and uncooked cheese.  If you feel sick to your stomach (nauseous) or throw up (vomit): ? Eat 4 or 5 small meals a day instead of 3 large meals. ? Try eating a few soda crackers. ? Drink liquids between meals instead of during meals.  To prevent constipation: ? Eat foods that are high in fiber, like fresh fruits and vegetables, whole grains, and beans. ? Drink enough fluids to keep your pee (urine) clear or pale yellow. Activity  Exercise only as told by your doctor. Stop exercising if you have cramps or pain in your lower belly (abdomen) or low back.  Do not exercise if it is too hot, too humid, or if you are in a place of great height (high altitude).  Try to avoid standing for long periods of time. Move your legs often if you must stand in one place for a long time.  Avoid heavy lifting.  Wear low-heeled shoes. Sit and stand up straight.  You can have sex unless your doctor tells you not to. Relieving pain and discomfort  Wear a good support bra if your breasts are sore.  Take warm water baths (sitz baths) to soothe pain or discomfort caused by hemorrhoids. Use hemorrhoid cream if your doctor says it is okay.  Rest with your legs raised if you have leg cramps or low back  pain.  If you have puffy, bulging veins (varicose veins) in your legs: ? Wear support hose or compression stockings as told by your doctor. ? Raise (elevate) your feet for 15 minutes, 3-4 times a day. ? Limit salt in your food. Prenatal care  Schedule your prenatal visits by the twelfth week of pregnancy.  Write down your questions. Take them to your prenatal visits.  Keep all your prenatal visits as told by your doctor. This is important. Safety  Wear your seat belt at all times when driving.  Make a list of emergency phone numbers. The list should include numbers for family, friends, the hospital, and police and fire departments. General  instructions  Ask your doctor for a referral to a local prenatal class. Begin classes no later than at the start of month 6 of your pregnancy.  Ask for help if you need counseling or if you need help with nutrition. Your doctor can give you advice or tell you where to go for help.  Do not use hot tubs, steam rooms, or saunas.  Do not douche or use tampons or scented sanitary pads.  Do not cross your legs for long periods of time.  Avoid all herbs and alcohol. Avoid drugs that are not approved by your doctor.  Do not use any tobacco products, including cigarettes, chewing tobacco, and electronic cigarettes. If you need help quitting, ask your doctor. You may get counseling or other support to help you quit.  Avoid cat litter boxes and soil used by cats. These carry germs that can cause birth defects in the baby and can cause a loss of your baby (miscarriage) or stillbirth.  Visit your dentist. At home, brush your teeth with a soft toothbrush. Be gentle when you floss. Contact a doctor if:  You are dizzy.  You have mild cramps or pressure in your lower belly.  You have a nagging pain in your belly area.  You continue to feel sick to your stomach, you throw up, or you have watery poop (diarrhea).  You have a bad smelling fluid coming from your vagina.  You have pain when you pee (urinate).  You have increased puffiness (swelling) in your face, hands, legs, or ankles. Get help right away if:  You have a fever.  You are leaking fluid from your vagina.  You have spotting or bleeding from your vagina.  You have very bad belly cramping or pain.  You gain or lose weight rapidly.  You throw up blood. It may look like coffee grounds.  You are around people who have [place] measles, fifth disease, or chickenpox.  You have a very bad headache.  You have shortness of breath.  You have any kind of trauma, such as from a fall or a car accident. Summary  The first trimester of  pregnancy is from week 1 until the end of week 13 (months 1 through 3).  To take care of yourself and your unborn baby, you will need to eat healthy meals, take medicines only if your doctor tells you to do so, and do activities that are safe for you and your baby.  Keep all follow-up visits as told by your doctor. This is important as your doctor will have to ensure that your baby is healthy and growing well. This information is not intended to replace advice given to you by your health care provider. Make sure you discuss any questions you have with your health care provider. Document Released: 02/14/2008 Document  Revised: 09/05/2016 Document Reviewed: 09/05/2016 Elsevier Interactive Patient Education  2017 [employer].

## 2017-10-23 ENCOUNTER — Encounter: Payer: Self-pay | Admitting: Family Medicine

## 2017-10-23 LAB — CERVICOVAGINAL ANCILLARY ONLY
Chlamydia: NEGATIVE
Neisseria Gonorrhea: NEGATIVE

## 2017-10-23 LAB — CYTOLOGY - PAP: DIAGNOSIS: NEGATIVE

## 2017-11-19 ENCOUNTER — Other Ambulatory Visit: Payer: Self-pay

## 2017-11-19 ENCOUNTER — Encounter: Payer: Self-pay | Admitting: Psychology

## 2017-11-19 ENCOUNTER — Ambulatory Visit (INDEPENDENT_AMBULATORY_CARE_PROVIDER_SITE_OTHER): Payer: Self-pay | Admitting: Family Medicine

## 2017-11-19 VITALS — BP 94/58 | HR 92 | Temp 98.2°F | Wt 138.2 lb

## 2017-11-19 DIAGNOSIS — Z1389 Encounter for screening for other disorder: Secondary | ICD-10-CM

## 2017-11-19 DIAGNOSIS — Z3491 Encounter for supervision of normal pregnancy, unspecified, first trimester: Secondary | ICD-10-CM

## 2017-11-19 DIAGNOSIS — F430 Acute stress reaction: Secondary | ICD-10-CM

## 2017-11-19 DIAGNOSIS — F321 Major depressive disorder, single episode, moderate: Secondary | ICD-10-CM

## 2017-11-19 MED ORDER — CITALOPRAM HYDROBROMIDE 20 MG PO TABS: 20.0000 mg | ORAL_TABLET | Freq: Every day | ORAL | 3 refills | Status: DC

## 2017-11-19 NOTE — Patient Instructions (Addendum)
Thank you for coming in today, it was so nice to see you! Today we talked about:    Pregnancy: Please follow up in 4 weeks for your next OB appt. Call [employer] to try and get coverage. Let me know how this goes  Depression and anxiety: Please follow up in 2 weeks with me. Start taking 1 pill of Celexa daily  If you have any questions or concerns, please do not hesitate to call the office at [phone]. You can also message me directly via MyChart.   Sincerely,  Smitty Cords, MD    First Trimester of Pregnancy The first trimester of pregnancy is from week 1 until the end of week 13 (months 1 through 3). During this time, your baby will begin to develop inside you. At 6-8 weeks, the eyes and face are formed, and the heartbeat can be seen on ultrasound. At the end of 12 weeks, all the baby's organs are formed. Prenatal care is all the medical care you receive before the birth of your baby. Make sure you get good prenatal care and follow all of your doctor's instructions. Follow these instructions at home: Medicines  Take over-the-counter and prescription medicines only as told by your doctor. Some medicines are safe and some medicines are not safe during pregnancy.  Take a prenatal vitamin that contains at least 600 micrograms (mcg) of folic acid.  If you have trouble pooping (constipation), take medicine that will make your stool soft (stool softener) if your doctor approves. Eating and drinking  Eat regular, healthy meals.  Your doctor will tell you the amount of weight gain that is right for you.  Avoid raw meat and uncooked cheese.  If you feel sick to your stomach (nauseous) or throw up (vomit): ? Eat 4 or 5 small meals a day instead of 3 large meals. ? Try eating a few soda crackers. ? Drink liquids between meals instead of during meals.  To prevent constipation: ? Eat foods that are high in fiber, like fresh fruits and vegetables, whole grains, and  beans. ? Drink enough fluids to keep your pee (urine) clear or pale yellow. Activity  Exercise only as told by your doctor. Stop exercising if you have cramps or pain in your lower belly (abdomen) or low back.  Do not exercise if it is too hot, too humid, or if you are in a place of great height (high altitude).  Try to avoid standing for long periods of time. Move your legs often if you must stand in one place for a long time.  Avoid heavy lifting.  Wear low-heeled shoes. Sit and stand up straight.  You can have sex unless your doctor tells you not to. Relieving pain and discomfort  Wear a good support bra if your breasts are sore.  Take warm water baths (sitz baths) to soothe pain or discomfort caused by hemorrhoids. Use hemorrhoid cream if your doctor says it is okay.  Rest with your legs raised if you have leg cramps or low back pain.  If you have puffy, bulging veins (varicose veins) in your legs: ? Wear support hose or compression stockings as told by your doctor. ? Raise (elevate) your feet for 15 minutes, 3-4 times a day. ? Limit salt in your food. Prenatal care  Schedule your prenatal visits by the twelfth week of pregnancy.  Write down your questions. Take them to your prenatal visits.  Keep all your prenatal visits as told by your doctor.  This is important. Safety  Wear your seat belt at all times when driving.  Make a list of emergency phone numbers. The list should include numbers for family, friends, the hospital, and police and fire departments. General instructions  Ask your doctor for a referral to a local prenatal class. Begin classes no later than at the start of month 6 of your pregnancy.  Ask for help if you need counseling or if you need help with nutrition. Your doctor can give you advice or tell you where to go for help.  Do not use hot tubs, steam rooms, or saunas.  Do not douche or use tampons or scented sanitary pads.  Do not cross your legs  for long periods of time.  Avoid all herbs and alcohol. Avoid drugs that are not approved by your doctor.  Do not use any tobacco products, including cigarettes, chewing tobacco, and electronic cigarettes. If you need help quitting, ask your doctor. You may get counseling or other support to help you quit.  Avoid cat litter boxes and soil used by cats. These carry germs that can cause birth defects in the baby and can cause a loss of your baby (miscarriage) or stillbirth.  Visit your dentist. At home, brush your teeth with a soft toothbrush. Be gentle when you floss. Contact a doctor if:  You are dizzy.  You have mild cramps or pressure in your lower belly.  You have a nagging pain in your belly area.  You continue to feel sick to your stomach, you throw up, or you have watery poop (diarrhea).  You have a bad smelling fluid coming from your vagina.  You have pain when you pee (urinate).  You have increased puffiness (swelling) in your face, hands, legs, or ankles. Get help right away if:  You have a fever.  You are leaking fluid from your vagina.  You have spotting or bleeding from your vagina.  You have very bad belly cramping or pain.  You gain or lose weight rapidly.  You throw up blood. It may look like coffee grounds.  You are around people who have [place] measles, fifth disease, or chickenpox.  You have a very bad headache.  You have shortness of breath.  You have any kind of trauma, such as from a fall or a car accident. Summary  The first trimester of pregnancy is from week 1 until the end of week 13 (months 1 through 3).  To take care of yourself and your unborn baby, you will need to eat healthy meals, take medicines only if your doctor tells you to do so, and do activities that are safe for you and your baby.  Keep all follow-up visits as told by your doctor. This is important as your doctor will have to ensure that your baby is healthy and growing  well. This information is not intended to replace advice given to you by your health care provider. Make sure you discuss any questions you have with your health care provider. Document Released: 02/14/2008 Document Revised: 09/05/2016 Document Reviewed: 09/05/2016 Elsevier Interactive Patient Education  2017 [employer].

## 2017-11-19 NOTE — Progress Notes (Signed)
Alejandra Conway is a 32 y.o. [id number] at [redacted]w[redacted]d for routine follow up.  She reports intermittently lightheaded and nauseaus.  Denies any syncopal or presyncopal episodes.  Notes that she needs to drink more water, she is drinking mostly orange juice.  Denies any contractions, vaginal bleeding, or vaginal discharge.   Depression and Anxiety: Patient endorses depression and anxiety.  She notes that she previously had depression anxiety and was on medication and now she has been off she has been feeling worse.  She thinks that even over the last couple weeks her symptoms have become worse.  She is mostly depressed about her feelings of becoming more dependent upon her husband and her and her husband do not have a good relationship.  She notes that she has been crying often and has had poor sleep.  She is feeling guilty that she is pregnant because she does not want to neglect her other 2 children.  GAD 7 : Generalized Anxiety Score 11/19/2017  Nervous, Anxious, on Edge 1  Control/stop worrying 2  Worry too much - different things 3  Trouble relaxing 3  Restless 0  Easily annoyed or irritable 3  Afraid - awful might happen 1  Total GAD 7 Score 13  Anxiety Difficulty Very difficult   Depression screen PHQ 2/9 11/19/2017  Decreased Interest 3  Down, Depressed, Hopeless 3  PHQ - 2 Score 6  Altered sleeping 3  Tired, decreased energy 3  Change in appetite 1  Feeling bad or failure about yourself  3  Trouble concentrating 2  Moving slowly or fidgety/restless 1  Suicidal thoughts 2  PHQ-9 Score 21  Difficult doing work/chores Very difficult  Some recent data might be hidden   See flow sheet for details.  A/P:  1. Pregnancy at [redacted]w[redacted]d.  Doing well physically.  Mentally she is suffering from depression and anxiety.  Anatomy ultrasound should be scheduled at 18-19 weeks. Patient currently has no insurance. Gave her information for [employer]. Will see if she can get coverage with [employer]  before we order the anatomy U/S. She will call our clinic in the next week to let us know if she is accepted from [employer]. Pt  Is not sure about genetic screening, will discuss it with her husband. Bleeding and pain precautions reviewed. Emphasized importance of increased water consumption, will drink until her urine is clear Follow up 4 weeks.  2.  Depression and anxiety: PHQ 9 score of 20 and GAD 7 score of 13.  Passive death wish but no active suicidal ideation.  Depression and anxiety has been a recurrent issue even before patient was pregnant however seems to be acutely worsening.  Mostly surrounding trouble in her marriage.  She prefers not to talk about her mood in front of her husband as "he just does not understand". Precepted with Dr. Mingo Amber -Behavioral health consultant, Larene Beach, was able to speak with patient today and schedule appointment for 1 week.  Please see her separate note. -Began Celexa 20 mg daily -Follow-up in 2 weeks to see how she is doing on medication   Smitty Cords, MD [place], PGY-3

## 2017-11-19 NOTE — Progress Notes (Signed)
Dr. Juanito Doom requested a [place].   Presenting Issue:  Anxiety, depression, panic attacks  Report of symptoms:  This patient has been feeling anxious and depressed; she is newly pregnant and had decided to quit taking Sertraline (Zoloft).   Duration of CURRENT symptoms:  "Two years"  Age of onset of first mood disturbance:  "Hard to say because I only recently figured out there was something wrong."  Impact on function:  Sleeping a lot, irritable, crying.  Psychiatric History - Diagnoses: Anxiety, depression - Hospitalizations: none - Pharmacotherapy: prozac, zoloft,  - Outpatient therapy: none, other than meeting with Zella Ball ([facility])  Family history of psychiatric issues:  Father has depression, paranoid schizophrenia; mother has depression  Current and history of substance use:  none  Medical conditions that might explain or contribute to symptoms:  pregnancy  PHQ-9:  20 (severe depression, SI) (given by Dr. Juanito Doom) GAD-7:  13 (moderate anxiety) (given by Dr. Juanito Doom) MDQ (if indicated):  Not given; will give during next visit.  Assessment / Plan / Recommendations: Alejandra Conway is tearful and states that she is having difficulty getting out of bed. Because she articulated SI in her PHQ9, I conducted a brief assessment regarding suicidality. She denied having a plan. The patient stated that she was unlikely to hurt herself today, this week or this month. Her children prevent her from hurting herself; that is, they are a barrier for her. She has little social support, as her friends "have problems of their own" and she does not feel comfortable confiding in them. Her husband is not supportive. During our next visit, it would be beneficial to discuss expanding her social support system by volunteering at her child's school/preschool so that she can meet other women. Behavioral activation may be helpful. I provided empathy and reflective listening. Alejandra Conway will  return on Monday, March 18 at 8:30 a.m.  Warmhandoff completed.

## 2017-11-22 ENCOUNTER — Encounter: Payer: Self-pay | Admitting: Family Medicine

## 2017-11-26 ENCOUNTER — Ambulatory Visit: Payer: Self-pay | Admitting: Psychology

## 2017-11-26 DIAGNOSIS — F321 Major depressive disorder, single episode, moderate: Secondary | ICD-10-CM
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
## 2017-11-26 NOTE — Patient Instructions (Addendum)
[name]: it was good to see you today. Please consider taking Celexa. Dr. Juanito Doom feels that it is best for you to treat your depression. Please see her email to you. Also focus on doing things that you enjoy and/or give you a sense of accomplishment. Try to practice your diaphragmatic breathing at night before you go to sleep. Return to see me on April 1 at 8:30. Take care. Larene Beach [facility]

## 2017-11-26 NOTE — Assessment & Plan Note (Addendum)
Assessment/Plan/Recommendations: The patient is experiencing severe symptoms of depression (PHQ9 = 21, passive SI, very difficult) and mild to moderate anxiety (GAD7 = 8, somewhat difficult). Her MDQ was negative (endorsed irritability, distraction and racing thoughts). She was tearful and spoke very quietly. I had to ask her to repeat herself several times due to her soft voice. I provided support and reflective listening. [name] would likely benefit from behavioral activation. I provided her with a handout of suggested activities and also suggested that she consider volunteering at her child's school to meet other mothers. She reported that she has very little social support because her friends have their own problems. Through Socratic questioning, I asked her what she would do if a friend called to discuss her depression or marriage problems. She responded that she would drop everything to help that person. [name] then admitted that she can call on some of her friends for help and had met a friend this week to talk. I also recommended marital counseling, but she does not think that her husband would agree to this. [name] will return on April 1 at 8:30 a.m.

## 2017-12-06 ENCOUNTER — Ambulatory Visit (INDEPENDENT_AMBULATORY_CARE_PROVIDER_SITE_OTHER): Payer: Self-pay | Admitting: Family Medicine

## 2017-12-06 DIAGNOSIS — F321 Major depressive disorder, single episode, moderate: Secondary | ICD-10-CM

## 2017-12-06 DIAGNOSIS — M542 Cervicalgia: Secondary | ICD-10-CM

## 2017-12-06 MED ORDER — CYCLOBENZAPRINE HCL 5 MG PO TABS: 5.0000 mg | ORAL_TABLET | Freq: Three times a day (TID) | ORAL | 0 refills | Status: DC | PRN

## 2017-12-06 NOTE — Progress Notes (Signed)
Subjective:    Patient ID: Alejandra Conway , female   DOB: 07/24/1986 , 32 y.o..   MRN: [id number]  HPI  Alejandra Conway is here for  Chief Complaint  Patient presents with  . Neck Pain  . medication review    1. Depression and Anxiety: Patient endorses depression and anxiety.    She notes that she initially was hesitant to start the Celexa as she was worried about the side effects for the baby however last week she did start the Celexa.  She stopped it again though a couple days ago because she was having some trouble sleeping and thought that the medication likely was contributing.  She was taking the Celexa at night.  Denies any suicidal or homicidal ideations.  2. Neck Pain: Patient admits to some neck pain over the last week.  The neck pain has been getting steadily better but still present.  It is worse at night.  Pain is worse with any neck movement but particularly moving her head from side to side.  She does not know what has caused this, denies any trauma.  She does woke up with the pain 1 week ago.  Has tried Tylenol and heating pads.  Review of Systems: Per HPI.   Past Medical History: Patient Active Problem List   Diagnosis Date Noted  . Neck pain, musculoskeletal 12/08/2017  . Supervision of normal pregnancy 10/08/2017  . Migraine 11/16/2015  . Depression, Moderate 05/13/2015  . Stress induced anxiety 05/04/2015  . ABNORMAL PAP SMEAR, LGSIL 11/25/2009  . ASTHMA, INTERMITTENT 11/08/2006    Medications: reviewed and updated Current Outpatient Medications  Medication Sig Dispense Refill  . acetaminophen-codeine (TYLENOL #3) 300-30 MG per tablet Take 1 tablet by mouth every 4 (four) hours as needed for moderate pain. (Patient not taking: Reported on 05/03/2015) 30 tablet 0  . albuterol (PROVENTIL HFA;VENTOLIN HFA) 108 (90 Base) MCG/ACT inhaler Inhale 2 puffs into the lungs every 6 (six) hours as needed for shortness of breath (with exercise). (Patient not  taking: Reported on 10/08/2017) 1 Inhaler 4  . citalopram (CELEXA) 20 MG tablet Take 1 tablet (20 mg total) by mouth daily. 30 tablet 3  . cyclobenzaprine (FLEXERIL) 5 MG tablet Take 1 tablet (5 mg total) by mouth 3 (three) times daily as needed for muscle spasms. 10 tablet 0   No current facility-administered medications for this visit.     Social Hx:  reports that she has never smoked. She has never used smokeless tobacco.   Objective:   BP 92/60   Pulse 89   Temp 98 F (36.7 C)   Wt 139 lb 6.4 oz (63.2 kg)   LMP 08/19/2017 (Exact Date)   BMI 25.50 kg/m  Physical Exam  Gen: NAD, alert, cooperative with exam, well-appearing Psych: good insight, normal mood and affect Neck: Inspection: Normal head position, no muscular atrophy  Palpation: No midline cervical tenderness but does have paraspinal muscle tenderness and spasms Range of Motion: Limited secondary to pain Strength: Difficult to assess secondary to pain Sensation: Gross sensation intact in neck and upper extremities Neurovascularly intact   Assessment & Plan:  Depression, Moderate Patient has not been consistently taking Celexa.  Discussed the risks and benefits of taking Celexa with her today.  Her mood is essentially unchanged from previous.  Denies any suicidal or homicidal ideation.  Notes that her mood has not been great because she is also dealing with neck pain (see separate assessment and plan) -Follow-up in 2 weeks  for next OB appointment, will address her mood as well at that visit -Continue to follow with behavioral health -She prefers to talk about her mood without her husband   Neck pain, musculoskeletal Pain secondary to muscle strain signs of cervical spine injury and no history of trauma. - Tylenol as needed (no NSAIDs secondary to pregnancy) -Flexeril 5 mg every 8 as needed - Heating pads to affected area as needed -Follow-up in 2 weeks   Meds ordered this encounter  Medications  .  cyclobenzaprine (FLEXERIL) 5 MG tablet    Sig: Take 1 tablet (5 mg total) by mouth 3 (three) times daily as needed for muscle spasms.    Dispense:  10 tablet    Refill:  0    Smitty Cords, MD [place], PGY-3

## 2017-12-06 NOTE — Patient Instructions (Addendum)
Thank you for coming in today, it was so nice to see you! Today we talked about:    Try taking Celexa in the mornings instead of nights. We will follow up in a couple weeks and see how you're doing on this  Neck pain: This is likely from a strained muscle.  You can continue taking Tylenol as needed.  You can also use heat as he has been doing.  I have prescribed you a muscle relaxer, just note that this can make you sleepy  If you have any questions or concerns, please do not hesitate to call the office at [phone]. You can also message me directly via MyChart.   Sincerely,  Smitty Cords, MD    Safe Medications in Pregnancy   Backache/Headache: Tylenol: 2 regular strength every 4 hours OR              2 Extra strength every 6 hours  Colds/Coughs/Allergies: Benadryl (alcohol free) 25 mg every 6 hours as needed Breath right strips Claritin Cepacol throat lozenges Chloraseptic throat spray Cold-Eeze- up to three times per day Cough drops, alcohol free Flonase (by prescription only) Guaifenesin Mucinex Robitussin DM (plain only, alcohol free) Saline nasal spray/drops Sudafed (pseudoephedrine) & Actifed ** use only after [redacted] weeks gestation and if you do not have high blood pressure Tylenol Vicks Vaporub Zinc lozenges Zyrtec   Constipation: Colace Ducolax suppositories Fleet enema Glycerin suppositories Metamucil Milk of magnesia Miralax Senokot Smooth move tea  Diarrhea: Kaopectate Imodium A-D  *NO pepto Bismol  Hemorrhoids: Anusol Anusol HC Preparation H Tucks  Indigestion: Tums Maalox Mylanta Zantac  Pepcid  Insomnia: Benadryl (alcohol free) 25mg  every 6 hours as needed Tylenol PM Unisom, no Gelcaps  Leg Cramps: Tums MagGel  Nausea/Vomiting:  Bonine Dramamine Emetrol Ginger extract Sea bands Meclizine  Nausea medication to take during pregnancy:  Unisom (doxylamine succinate 25 mg tablets) Take one tablet daily at bedtime.  If symptoms are not adequately controlled, the dose can be increased to a maximum recommended dose of two tablets daily (1/2 tablet in the morning, 1/2 tablet mid-afternoon and one at bedtime). Vitamin B6 100mg  tablets. Take one tablet twice a day (up to 200 mg per day).  Skin Rashes: Aveeno products Benadryl cream or 25mg  every 6 hours as needed Calamine Lotion 1% cortisone cream  Yeast infection: Gyne-lotrimin 7 Monistat 7   **If taking multiple medications, please check labels to avoid duplicating the same active ingredients **take medication as directed on the label ** Do not exceed 4000 mg of tylenol in 24 hours **Do not take medications that contain aspirin or ibuprofen

## 2017-12-08 DIAGNOSIS — M542 Cervicalgia: Secondary | ICD-10-CM | POA: Insufficient documentation

## 2017-12-08 NOTE — Assessment & Plan Note (Signed)
Pain secondary to muscle strain signs of cervical spine injury and no history of trauma. - Tylenol as needed (no NSAIDs secondary to pregnancy) -Flexeril 5 mg every 8 as needed - Heating pads to affected area as needed -Follow-up in 2 weeks

## 2017-12-08 NOTE — Assessment & Plan Note (Addendum)
Patient has not been consistently taking Celexa.  Discussed the risks and benefits of taking Celexa with her today.  Her mood is essentially unchanged from previous.  Denies any suicidal or homicidal ideation.  Notes that her mood has not been great because she is also dealing with neck pain (see separate assessment and plan) -Follow-up in 2 weeks for next OB appointment, will address her mood as well at that visit -Continue to follow with behavioral health -She prefers to talk about her mood without her husband

## 2017-12-10 ENCOUNTER — Ambulatory Visit: Payer: Self-pay

## 2017-12-21 ENCOUNTER — Encounter: Payer: Self-pay | Admitting: Family Medicine

## 2017-12-26 ENCOUNTER — Encounter: Payer: Self-pay | Admitting: Family Medicine

## 2018-01-07 ENCOUNTER — Other Ambulatory Visit: Payer: Self-pay

## 2018-01-07 ENCOUNTER — Ambulatory Visit (INDEPENDENT_AMBULATORY_CARE_PROVIDER_SITE_OTHER): Payer: Self-pay | Admitting: Family Medicine

## 2018-01-07 VITALS — BP 90/58 | HR 73 | Temp 98.0°F | Wt 143.0 lb

## 2018-01-07 DIAGNOSIS — F430 Acute stress reaction: Secondary | ICD-10-CM

## 2018-01-07 DIAGNOSIS — Z3492 Encounter for supervision of normal pregnancy, unspecified, second trimester: Secondary | ICD-10-CM

## 2018-01-07 DIAGNOSIS — F321 Major depressive disorder, single episode, moderate: Secondary | ICD-10-CM

## 2018-01-07 NOTE — Progress Notes (Signed)
Pt presents in clinic for routine OB with need for scheduled anatomy scan. Health department contacted, May 6 @1130am , pt has been informed and form faxed.

## 2018-01-07 NOTE — Progress Notes (Signed)
Alejandra Conway is a 32 y.o. [id number] at [redacted]w[redacted]d for routine follow up.  She reports she has been doing well. Denies any contractions, vaginal bleeding, or vaginal discharge. Has been feeling slight movements in her abdomen but not any kicks.  She notes that she still has some depression and anxiety however it is slightly better than before.  She had maybe 1 or 2 breakdowns over the last couple weeks.  She has been more functional according to her report.  She is taking the Celexa as prescribed every day.   See flow sheet for details.  A/P:   Pregnancy at 102w1d.  Doing well.   Pregnancy issues include: Depression and anxiety, treating with celexa Anatomy scan has not been ordered yet, ordered today to be [facility] at the [facility] due to no insurance. She is in the process of being approved for adopt a mom still Preterm labor precautions reviewed. Follow up 4 weeks.  Depression and Anxiety:Still uncontrolled but slightly improved from prior.  PHQ 9 score of 7, still has a passive death wish at times but no active suicidal or homicidal ideation.  Gad 7 score of 8.  -Continue Celexa 20 mg daily -Continue to follow-up with behavioral health consultant -Follow-up in 4 weeks at next prenatal appointment   Smitty Cords, MD [place], PGY-3

## 2018-01-07 NOTE — Patient Instructions (Addendum)
   You are doing great! Baby's heartbeat sounds perfect. We have ordered an anatomy scan for you, I will let you know the results when I get them  Come to the MAU (maternity admission unit) for 1) Strong contractions every 2-3 minutes for at least 1 hour that do not go away when you drink water or take a warm shower. These contractions will be so strong all you can do is breath through them 2) Vaginal bleeding- anything more than spotting 3) Loss of fluid like you broke your water 4) Decreased movement of your baby

## 2018-01-21 ENCOUNTER — Telehealth: Payer: Self-pay

## 2018-01-21 ENCOUNTER — Other Ambulatory Visit: Payer: Self-pay | Admitting: Family Medicine

## 2018-01-21 ENCOUNTER — Telehealth: Payer: Self-pay | Admitting: Family Medicine

## 2018-01-21 DIAGNOSIS — O4402 Placenta previa specified as without hemorrhage, second trimester: Secondary | ICD-10-CM

## 2018-01-21 NOTE — Telephone Encounter (Signed)
Abigail Butts from [facility] called. Our fax is down so she cant fax the results. Wanted to let Dr. Gwyndolyn Kaufman that pt has total placenta previa. Ottis Stain, CMA

## 2018-01-21 NOTE — Telephone Encounter (Signed)
Pt left message on nurse line stating she was returning call to Dr. Juanito Doom. Call back number [phone] Wallace Cullens, RN

## 2018-01-21 NOTE — Progress Notes (Signed)
Opened in error

## 2018-01-21 NOTE — Telephone Encounter (Signed)
Received a call from [facility] that patient had a complete placenta previa on her anatomy ultrasound on May 9.    Discussed patient with OB attending Dr. Rosana Hoes who suggested the following; complete pelvic rest (no sex or anything in the vagina at all), a repeat ultrasound in 4 weeks to monitor, any vaginal bleeding at all patient needs to go immediately to the MAU.  Recommended referral to high risk OB clinic.   Called patient and discussed the above.  She expressed good understanding.  Will place referral to high risk clinic at this time.   Our fax machine is currently down, will await the official faxed results from the anatomy ultrasound once it is working again.  Smitty Cords, MD [place], PGY-3

## 2018-01-24 ENCOUNTER — Encounter: Payer: Self-pay | Admitting: Family Medicine

## 2018-01-24 DIAGNOSIS — O4402 Placenta previa specified as without hemorrhage, second trimester: Secondary | ICD-10-CM | POA: Insufficient documentation

## 2018-01-28 ENCOUNTER — Encounter: Payer: Self-pay | Admitting: *Deleted

## 2018-01-28 ENCOUNTER — Encounter: Payer: Self-pay | Admitting: Obstetrics and Gynecology

## 2018-01-28 DIAGNOSIS — R8271 Bacteriuria: Secondary | ICD-10-CM | POA: Insufficient documentation

## 2018-01-28 NOTE — Progress Notes (Deleted)
Last pap 2019- normal  Referred to [facility] by Dr. Juanito Doom for Complete placenta previa nos or without hemorrhage, second trimester - High Risk [place] on [place] from [place] was 18.4wks on 01/17/18 with complete previa - needs follow up Pt stated at [place] that LMP was not accurate

## 2018-01-28 NOTE — Progress Notes (Signed)
Patient did not keep OB referral appointment for 01/28/2018.  Durene Romans MD Attending Center for [employer] [facility])

## 2018-01-30 ENCOUNTER — Telehealth: Payer: Self-pay | Admitting: Family Medicine

## 2018-01-30 NOTE — Telephone Encounter (Signed)
Called patient to inquire about her missed OB appointment at the high risk clinic. No answer. Left voicemail. Asked her on voicemail to please call and reschedule her appointment.   Smitty Cords, MD [place], PGY-3

## 2018-01-30 NOTE — Progress Notes (Signed)
I believe that is all you need to do. If you have their phone number, you can provide patient with it for scheduling. Let us also document that patient is doing okay with her pregnancy and give her return precaution to the MAU should she be having any vaginal bleeding.

## 2018-02-01 ENCOUNTER — Ambulatory Visit (INDEPENDENT_AMBULATORY_CARE_PROVIDER_SITE_OTHER): Payer: Self-pay | Admitting: Obstetrics and Gynecology

## 2018-02-01 ENCOUNTER — Encounter: Payer: Self-pay | Admitting: Obstetrics and Gynecology

## 2018-02-01 VITALS — BP 109/67 | HR 92 | Wt 151.0 lb

## 2018-02-01 DIAGNOSIS — R8271 Bacteriuria: Secondary | ICD-10-CM

## 2018-02-01 DIAGNOSIS — Z3402 Encounter for supervision of normal first pregnancy, second trimester: Secondary | ICD-10-CM

## 2018-02-01 DIAGNOSIS — F329 Major depressive disorder, single episode, unspecified: Secondary | ICD-10-CM

## 2018-02-01 DIAGNOSIS — F32A Depression, unspecified: Secondary | ICD-10-CM

## 2018-02-01 DIAGNOSIS — O4402 Placenta previa specified as without hemorrhage, second trimester: Secondary | ICD-10-CM

## 2018-02-01 DIAGNOSIS — F419 Anxiety disorder, unspecified: Secondary | ICD-10-CM | POA: Insufficient documentation

## 2018-02-01 MED ORDER — CEPHALEXIN 500 MG PO CAPS: 500.0000 mg | ORAL_CAPSULE | Freq: Three times a day (TID) | ORAL | 0 refills | Status: AC

## 2018-02-01 NOTE — Progress Notes (Signed)
Prenatal Visit Note-Transfer of Care Visit ([place]) Date: 02/01/2018 Clinic: [facility]  Subjective:  Alejandra Conway is a 32 y.o. [phone] at [redacted]w[redacted]d being seen today for ongoing prenatal care.  She is currently monitored for the following issues for this low-risk pregnancy and has ASTHMA, INTERMITTENT; Stress induced anxiety; Depression, Moderate; Migraine; Supervision of normal pregnancy; Neck pain, musculoskeletal; Complete placenta previa nos or without hemorrhage, second trimester; GBS bacteriuria; and Anxiety and depression on their problem list.  Patient reports no complaints.   Contractions: Not present. Vag. Bleeding: None.  Movement: Present. Denies leaking of fluid.   The following portions of the patient's history were reviewed and updated as appropriate: allergies, current medications, past family history, past medical history, past social history, past surgical history and problem list. Problem list updated.  Objective:   Vitals:   02/01/18 0854  BP: 109/67  Pulse: 92  Weight: 151 lb (68.5 kg)    Fetal Status: Fetal Heart Rate (bpm): 150   Movement: Present     General:  Alert, oriented and cooperative. Patient is in no acute distress.  Skin: Skin is warm and dry. No rash noted.   Cardiovascular: Normal heart rate noted  Respiratory: Normal respiratory effort, no problems with respiration noted  Abdomen: Soft, gravid, appropriate for gestational age. Pain/Pressure: Absent     Pelvic:  Cervical exam deferred        Extremities: Normal range of motion.  Edema: Trace  Mental Status: Normal mood and affect. Normal behavior. Normal judgment and thought content.   Urinalysis:      Assessment and Plan:  Pregnancy: [id number] at [redacted]w[redacted]d  1. Encounter for supervision of normal first pregnancy in second trimester Routine care. Declines genetics - US OB Follow Up; Future  2. Complete placenta previa nos or without hemorrhage,  second trimester Advised pelvic rest until f/u transvaginal u/s at 26wks.  3. GBS bacteriuria Doesn't appear she got treated for low level infection. Keflex sent in  4. Anxiety and depression Doing well on celexa  Preterm labor symptoms and general obstetric precautions including but not limited to vaginal bleeding, contractions, leaking of fluid and fetal movement were reviewed in detail with the patient. Please refer to After Visit Summary for other counseling recommendations.  Return in about 1 month (around 03/01/2018) for rob.   Aletha Halim, MD

## 2018-02-06 ENCOUNTER — Encounter: Payer: Self-pay | Admitting: Family Medicine

## 2018-02-11 ENCOUNTER — Encounter: Payer: Self-pay | Admitting: Radiology

## 2018-02-11 ENCOUNTER — Telehealth: Payer: Self-pay | Admitting: Radiology

## 2018-02-11 NOTE — Telephone Encounter (Signed)
Left voicemail on cell phone informing patient of Ultrasound appointment scheduled 02/14/18 @ 9:30am at [facility] Department.

## 2018-02-13 ENCOUNTER — Encounter: Payer: Self-pay | Admitting: Obstetrics and Gynecology

## 2018-02-21 ENCOUNTER — Encounter: Payer: Self-pay | Admitting: Family Medicine

## 2018-02-27 ENCOUNTER — Encounter: Payer: Self-pay | Admitting: Family Medicine

## 2018-02-27 ENCOUNTER — Ambulatory Visit (INDEPENDENT_AMBULATORY_CARE_PROVIDER_SITE_OTHER): Payer: Self-pay | Admitting: Obstetrics and Gynecology

## 2018-02-27 VITALS — BP 107/67 | HR 67 | Wt 155.4 lb

## 2018-02-27 DIAGNOSIS — O0992 Supervision of high risk pregnancy, unspecified, second trimester: Secondary | ICD-10-CM

## 2018-02-27 DIAGNOSIS — O4402 Placenta previa specified as without hemorrhage, second trimester: Secondary | ICD-10-CM

## 2018-02-27 DIAGNOSIS — Z3402 Encounter for supervision of normal first pregnancy, second trimester: Secondary | ICD-10-CM

## 2018-02-27 MED ORDER — CITALOPRAM HYDROBROMIDE 40 MG PO TABS: 40.0000 mg | ORAL_TABLET | Freq: Every day | ORAL | 1 refills | Status: DC

## 2018-02-27 NOTE — Telephone Encounter (Signed)
Patient sent me a my chart message stating that her anxiety is uncontrolled.  Discussed increasing her Celexa to 40 mg daily from 20 mg daily and scheduling appointment with me in the next week to discuss further.   Smitty Cords, MD [place], PGY-3

## 2018-02-27 NOTE — Progress Notes (Signed)
Prenatal Visit Note Date: 02/27/2018 Clinic: [facility]  Subjective:  Alejandra Conway is a 32 y.o. [phone] at [redacted]w[redacted]d being seen today for ongoing prenatal care.  She is currently monitored for the following issues for this high-risk pregnancy and has ASTHMA, INTERMITTENT; Stress induced anxiety; Depression, Moderate; Migraine; Supervision of normal pregnancy; Neck pain, musculoskeletal; Complete placenta previa nos or without hemorrhage, second trimester; GBS bacteriuria; and Anxiety and depression on their problem list.  Patient reports no complaints.   Contractions: Not present. Vag. Bleeding: None.  Movement: Present. Denies leaking of fluid.   The following portions of the patient's history were reviewed and updated as appropriate: allergies, current medications, past family history, past medical history, past social history, past surgical history and problem list. Problem list updated.  Objective:   Vitals:   02/27/18 0958  BP: 107/67  Pulse: 67  Weight: 155 lb 6.4 oz (70.5 kg)   Fetal Status: Fetal Heart Rate (bpm): 142   Movement: Present     General:  Alert, oriented and cooperative. Patient is in no acute distress.  Skin: Skin is warm and dry. No rash noted.   Cardiovascular: Normal heart rate noted  Respiratory: Normal respiratory effort, no problems with respiration noted  Abdomen: Soft, gravid, appropriate for gestational age. Pain/Pressure: Absent     Pelvic:  Cervical exam deferred        Extremities: Normal range of motion.  Edema: None  Mental Status: Normal mood and affect. Normal behavior. Normal judgment and thought content.   Urinalysis:      Assessment and Plan:  Pregnancy: [id number] at [redacted]w[redacted]d  1. Pregnancy Routine care.  2. Complete placenta previa nos or without hemorrhage, second trimester Pt missed her 6/6 appt and has r/s on 7/11 at [facility] radiology. Continue pelvic rest  Preterm labor symptoms and general obstetric  precautions including but not limited to vaginal bleeding, contractions, leaking of fluid and fetal movement were reviewed in detail with the patient. Please refer to After Visit Summary for other counseling recommendations.  Return in about 2 weeks (around 03/13/2018).   Aletha Halim, MD

## 2018-03-13 ENCOUNTER — Ambulatory Visit (INDEPENDENT_AMBULATORY_CARE_PROVIDER_SITE_OTHER): Payer: Self-pay | Admitting: Obstetrics & Gynecology

## 2018-03-13 ENCOUNTER — Encounter: Payer: Self-pay | Admitting: Obstetrics & Gynecology

## 2018-03-13 VITALS — BP 101/66 | HR 96 | Wt 160.2 lb

## 2018-03-13 DIAGNOSIS — O2342 Unspecified infection of urinary tract in pregnancy, second trimester: Secondary | ICD-10-CM

## 2018-03-13 DIAGNOSIS — O0992 Supervision of high risk pregnancy, unspecified, second trimester: Secondary | ICD-10-CM

## 2018-03-13 DIAGNOSIS — O4402 Placenta previa specified as without hemorrhage, second trimester: Secondary | ICD-10-CM

## 2018-03-13 DIAGNOSIS — B951 Streptococcus, group B, as the cause of diseases classified elsewhere: Secondary | ICD-10-CM

## 2018-03-13 DIAGNOSIS — O2340 Unspecified infection of urinary tract in pregnancy, unspecified trimester: Secondary | ICD-10-CM

## 2018-03-13 NOTE — Progress Notes (Signed)
   PRENATAL VISIT NOTE  Subjective:  Alejandra Conway is a 32 y.o. [id number] at [redacted]w[redacted]d being seen today for ongoing prenatal care.  She is currently monitored for the following issues for this low-risk pregnancy and has ASTHMA, INTERMITTENT; Stress induced anxiety; Depression, Moderate; Migraine; Supervision of high risk pregnancy in second trimester; Neck pain, musculoskeletal; Complete placenta previa nos or without hemorrhage, second trimester; GBS bacteriuria; Anxiety and depression; and GBS (group B streptococcus) UTI complicating pregnancy on their problem list.  Patient reports no complaints.  Contractions: Not present. Vag. Bleeding: None.  Movement: Present. Denies leaking of fluid.   The following portions of the patient's history were reviewed and updated as appropriate: allergies, current medications, past family history, past medical history, past social history, past surgical history and problem list. Problem list updated.  Objective:   Vitals:   03/13/18 1323  BP: 101/66  Pulse: 96  Weight: 160 lb 3.2 oz (72.7 kg)    Fetal Status: Fetal Heart Rate (bpm): 138   Movement: Present     General:  Alert, oriented and cooperative. Patient is in no acute distress.  Skin: Skin is warm and dry. No rash noted.   Cardiovascular: Normal heart rate noted  Respiratory: Normal respiratory effort, no problems with respiration noted  Abdomen: Soft, gravid, appropriate for gestational age.  Pain/Pressure: Present     Pelvic: Cervical exam deferred        Extremities: Normal range of motion.  Edema: None  Mental Status: Normal mood and affect. Normal behavior. Normal judgment and thought content.   Assessment and Plan:  Pregnancy: [id number] at [redacted]w[redacted]d  1. Supervision of high risk pregnancy in second trimester - glucola, labs, tdap at next visit  2. Complete placenta previa nos or without hemorrhage, second trimester - follow up u/s today - rec pelvic rest  3. Group B Streptococcus  urinary tract infection affecting pregnancy in second trimester -Treat in labor  Preterm labor symptoms and general obstetric precautions including but not limited to vaginal bleeding, contractions, leaking of fluid and fetal movement were reviewed in detail with the patient. Please refer to After Visit Summary for other counseling recommendations.  No follow-ups on file.  No future appointments.  Emily Filbert, MD

## 2018-04-03 ENCOUNTER — Ambulatory Visit (INDEPENDENT_AMBULATORY_CARE_PROVIDER_SITE_OTHER): Payer: Self-pay | Admitting: Obstetrics & Gynecology

## 2018-04-03 ENCOUNTER — Encounter: Payer: Self-pay | Admitting: Obstetrics & Gynecology

## 2018-04-03 VITALS — BP 97/58 | HR 87 | Wt 165.4 lb

## 2018-04-03 DIAGNOSIS — Z3482 Encounter for supervision of other normal pregnancy, second trimester: Secondary | ICD-10-CM

## 2018-04-03 DIAGNOSIS — O0992 Supervision of high risk pregnancy, unspecified, second trimester: Secondary | ICD-10-CM

## 2018-04-03 DIAGNOSIS — Z23 Encounter for immunization: Secondary | ICD-10-CM

## 2018-04-03 NOTE — Patient Instructions (Signed)
Tdap Vaccine (Tetanus, Diphtheria and Pertussis): What You Need to Know 1. Why get vaccinated? Tetanus, diphtheria and pertussis are very serious diseases. Tdap vaccine can protect us from these diseases. And, Tdap vaccine given to pregnant women can protect newborn babies against pertussis. TETANUS (Lockjaw) is rare in the [place] today. It causes painful muscle tightening and stiffness, usually all over the body.  It can lead to tightening of muscles in the head and neck so you can't open your mouth, swallow, or sometimes even breathe. Tetanus kills about 1 out of 10 people who are infected even after receiving the best medical care.  DIPHTHERIA is also rare in the [place] today. It can cause a thick coating to form in the back of the throat.  It can lead to breathing problems, heart failure, paralysis, and death.  PERTUSSIS (Whooping Cough) causes severe coughing spells, which can cause difficulty breathing, vomiting and disturbed sleep.  It can also lead to weight loss, incontinence, and rib fractures. Up to 2 in 100 adolescents and 5 in 100 adults with pertussis are hospitalized or have complications, which could include pneumonia or death.  These diseases are caused by bacteria. Diphtheria and pertussis are spread from person to person through secretions from coughing or sneezing. Tetanus enters the body through cuts, scratches, or wounds. Before vaccines, as many as 200,000 cases of diphtheria, 200,000 cases of pertussis, and hundreds of cases of tetanus, were reported in the [place] each year. Since vaccination began, reports of cases for tetanus and diphtheria have dropped by about 99% and for pertussis by about 80%. 2. Tdap vaccine Tdap vaccine can protect adolescents and adults from tetanus, diphtheria, and pertussis. One dose of Tdap is routinely given at age 11 or 12. People who did not get Tdap at that age should get it as soon as possible. Tdap is especially  important for healthcare professionals and anyone having close contact with a baby younger than 12 months. Pregnant women should get a dose of Tdap during every pregnancy, to protect the newborn from pertussis. Infants are most at risk for severe, life-threatening complications from pertussis. Another vaccine, called Td, protects against tetanus and diphtheria, but not pertussis. A Td booster should be given every 10 years. Tdap may be given as one of these boosters if you have never gotten Tdap before. Tdap may also be given after a severe cut or burn to prevent tetanus infection. Your doctor or the person giving you the vaccine can give you more information. Tdap may safely be given at the same time as other vaccines. 3. Some people should not get this vaccine  A person who has ever had a life-threatening allergic reaction after a previous dose of any diphtheria, tetanus or pertussis containing vaccine, OR has a severe allergy to any part of this vaccine, should not get Tdap vaccine. Tell the person giving the vaccine about any severe allergies.  Anyone who had coma or long repeated seizures within 7 days after a childhood dose of DTP or DTaP, or a previous dose of Tdap, should not get Tdap, unless a cause other than the vaccine was found. They can still get Td.  Talk to your doctor if you: ? have seizures or another nervous system problem, ? had severe pain or swelling after any vaccine containing diphtheria, tetanus or pertussis, ? ever had a condition called Guillain-Barr Syndrome (GBS), ? aren't feeling well on the day the shot is scheduled. 4. Risks With any medicine, including   vaccines, there is a chance of side effects. These are usually mild and go away on their own. Serious reactions are also possible but are rare. Most people who get Tdap vaccine do not have any problems with it. Mild problems following Tdap: (Did not interfere with activities)  Pain where the shot was given (about  3 in 4 adolescents or 2 in 3 adults)  Redness or swelling where the shot was given (about 1 person in 5)  Mild fever of at least 100.4F (up to about 1 in 25 adolescents or 1 in 100 adults)  Headache (about 3 or 4 people in 10)  Tiredness (about 1 person in 3 or 4)  Nausea, vomiting, diarrhea, stomach ache (up to 1 in 4 adolescents or 1 in 10 adults)  Chills, sore joints (about 1 person in 10)  Body aches (about 1 person in 3 or 4)  Rash, swollen glands (uncommon)  Moderate problems following Tdap: (Interfered with activities, but did not require medical attention)  Pain where the shot was given (up to 1 in 5 or 6)  Redness or swelling where the shot was given (up to about 1 in 16 adolescents or 1 in 12 adults)  Fever over 102F (about 1 in 100 adolescents or 1 in 250 adults)  Headache (about 1 in 7 adolescents or 1 in 10 adults)  Nausea, vomiting, diarrhea, stomach ache (up to 1 or 3 people in 100)  Swelling of the entire arm where the shot was given (up to about 1 in 500).  Severe problems following Tdap: (Unable to perform usual activities; required medical attention)  Swelling, severe pain, bleeding and redness in the arm where the shot was given (rare).  Problems that could happen after any vaccine:  People sometimes faint after a medical procedure, including vaccination. Sitting or lying down for about 15 minutes can help prevent fainting, and injuries caused by a fall. Tell your doctor if you feel dizzy, or have vision changes or ringing in the ears.  Some people get severe pain in the shoulder and have difficulty moving the arm where a shot was given. This happens very rarely.  Any medication can cause a severe allergic reaction. Such reactions from a vaccine are very rare, estimated at fewer than 1 in a million doses, and would happen within a few minutes to a few hours after the vaccination. As with any medicine, there is a very remote chance of a vaccine  causing a serious injury or death. The safety of vaccines is always being monitored. For more information, visit: www.cdc.gov/vaccinesafety/ 5. What if there is a serious problem? What should I look for? Look for anything that concerns you, such as signs of a severe allergic reaction, very high fever, or unusual behavior. Signs of a severe allergic reaction can include hives, swelling of the face and throat, difficulty breathing, a fast heartbeat, dizziness, and weakness. These would usually start a few minutes to a few hours after the vaccination. What should I do?  If you think it is a severe allergic reaction or other emergency that can't wait, call 9-1-1 or get the person to the nearest hospital. Otherwise, call your doctor.  Afterward, the reaction should be reported to the Vaccine Adverse Event Reporting System (VAERS). Your doctor might file this report, or you can do it yourself through the VAERS web site at www.vaers.hhs.gov, or by calling 1-800-822-7967. ? VAERS does not give medical advice. 6. The National Vaccine Injury Compensation Program The National   Vaccine Injury Compensation Program (VICP) is a federal program that was created to compensate people who may have been injured by certain vaccines. Persons who believe they may have been injured by a vaccine can learn about the program and about filing a claim by calling 1-800-338-2382 or visiting the VICP website at www.hrsa.gov/vaccinecompensation. There is a time limit to file a claim for compensation. 7. How can I learn more?  Ask your doctor. He or she can give you the vaccine package insert or suggest other sources of information.  Call your local or state health department.  Contact the Centers for Disease Control and Prevention (CDC): ? Call 1-800-232-4636 (1-800-CDC-INFO) or ? Visit CDC's website at www.cdc.gov/vaccines CDC Tdap Vaccine VIS (11/04/13) This information is not intended to replace advice given to you by your  health care provider. Make sure you discuss any questions you have with your health care provider. Document Released: 02/27/2012 Document Revised: 05/18/2016 Document Reviewed: 05/18/2016 Elsevier Interactive Patient Education  2017 Elsevier Inc.  

## 2018-04-03 NOTE — Progress Notes (Signed)
   PRENATAL VISIT NOTE  Subjective:  Alejandra Conway is a 32 y.o. [id number] at [redacted]w[redacted]d being seen today for ongoing prenatal care.  She is currently monitored for the following issues for this low-risk pregnancy and has ASTHMA, INTERMITTENT; Stress induced anxiety; Depression, Moderate; Migraine; Supervision of high risk pregnancy in second trimester; Neck pain, musculoskeletal; Complete placenta previa nos or without hemorrhage, second trimester; GBS bacteriuria; Anxiety and depression; and GBS (group B streptococcus) UTI complicating pregnancy on their problem list.  Patient reports no complaints.  Contractions: Not present. Vag. Bleeding: None.  Movement: Present. Denies leaking of fluid.   The following portions of the patient's history were reviewed and updated as appropriate: allergies, current medications, past family history, past medical history, past social history, past surgical history and problem list. Problem list updated.  Objective:   Vitals:   04/03/18 0845  BP: (!) 97/58  Pulse: 87  Weight: 165 lb 6.4 oz (75 kg)    Fetal Status: Fetal Heart Rate (bpm): 140   Movement: Present     General:  Alert, oriented and cooperative. Patient is in no acute distress.  Skin: Skin is warm and dry. No rash noted.   Cardiovascular: Normal heart rate noted  Respiratory: Normal respiratory effort, no problems with respiration noted  Abdomen: Soft, gravid, appropriate for gestational age.  Pain/Pressure: Absent     Pelvic: Cervical exam deferred        Extremities: Normal range of motion.  Edema: Trace  Mental Status: Normal mood and affect. Normal behavior. Normal judgment and thought content.   Assessment and Plan:  Pregnancy: [id number] at [redacted]w[redacted]d  1. Encounter for supervision of other normal pregnancy in second trimester  - RPR - HIV antibody - Glucose Tolerance, 2 Hours w/1 Hour - CBC  2. Supervision of high risk pregnancy in second trimester   Preterm labor symptoms and  general obstetric precautions including but not limited to vaginal bleeding, contractions, leaking of fluid and fetal movement were reviewed in detail with the patient. Please refer to After Visit Summary for other counseling recommendations.  Return in about 3 weeks (around 04/24/2018).  No future appointments.  Emily Filbert, MD

## 2018-04-04 LAB — CBC
HEMOGLOBIN: 11 g/dL — AB (ref 11.1–15.9)
Hematocrit: 33.1 % — ABNORMAL LOW (ref 34.0–46.6)
MCH: 29.2 pg (ref 26.6–33.0)
MCHC: 33.2 g/dL (ref 31.5–35.7)
MCV: 88 fL (ref 79–97)
PLATELETS: 181 10*3/uL (ref 150–450)
RBC: 3.77 x10E6/uL (ref 3.77–5.28)
RDW: 13.8 % (ref 12.3–15.4)
WBC: 8 10*3/uL (ref 3.4–10.8)

## 2018-04-04 LAB — RPR: RPR Ser Ql: NONREACTIVE

## 2018-04-04 LAB — HIV ANTIBODY (ROUTINE TESTING W REFLEX): HIV Screen 4th Generation wRfx: NONREACTIVE

## 2018-04-04 LAB — GLUCOSE TOLERANCE, 2 HOURS W/ 1HR
GLUCOSE, 1 HOUR: 97 mg/dL (ref 65–179)
GLUCOSE, 2 HOUR: 87 mg/dL (ref 65–152)
GLUCOSE, FASTING: 71 mg/dL (ref 65–91)

## 2018-04-26 ENCOUNTER — Telehealth: Payer: Self-pay | Admitting: General Practice

## 2018-04-26 ENCOUNTER — Ambulatory Visit (INDEPENDENT_AMBULATORY_CARE_PROVIDER_SITE_OTHER): Payer: Self-pay | Admitting: Obstetrics & Gynecology

## 2018-04-26 ENCOUNTER — Encounter: Payer: Self-pay | Admitting: Obstetrics & Gynecology

## 2018-04-26 VITALS — BP 109/69 | HR 96 | Wt 170.4 lb

## 2018-04-26 DIAGNOSIS — O4402 Placenta previa specified as without hemorrhage, second trimester: Secondary | ICD-10-CM

## 2018-04-26 DIAGNOSIS — O0992 Supervision of high risk pregnancy, unspecified, second trimester: Secondary | ICD-10-CM

## 2018-04-26 DIAGNOSIS — F419 Anxiety disorder, unspecified: Secondary | ICD-10-CM

## 2018-04-26 DIAGNOSIS — B951 Streptococcus, group B, as the cause of diseases classified elsewhere: Secondary | ICD-10-CM

## 2018-04-26 DIAGNOSIS — F329 Major depressive disorder, single episode, unspecified: Secondary | ICD-10-CM

## 2018-04-26 DIAGNOSIS — O2343 Unspecified infection of urinary tract in pregnancy, third trimester: Secondary | ICD-10-CM

## 2018-04-26 NOTE — Telephone Encounter (Signed)
Patient scheduled to see Roselyn Reef [facility] at [facility] on 04/30/18 at 3:00pm.  Patient verbalized understanding.

## 2018-04-26 NOTE — Progress Notes (Signed)
   PRENATAL VISIT NOTE  Subjective:  Alejandra Conway is a 32 y.o. [id number] at [redacted]w[redacted]d being seen today for ongoing prenatal care.  She is currently monitored for the following issues for this high-risk pregnancy and has ASTHMA, INTERMITTENT; Stress induced anxiety; Depression, Moderate; Migraine; Supervision of high risk pregnancy in second trimester; Neck pain, musculoskeletal; Anxiety and depression; and GBS (group B streptococcus) UTI complicating pregnancy on their problem list.  Patient reports no complaints.  Contractions: Irregular. Vag. Bleeding: None.  Movement: Present. Denies leaking of fluid.   The following portions of the patient's history were reviewed and updated as appropriate: allergies, current medications, past family history, past medical history, past social history, past surgical history and problem list. Problem list updated.  Objective:   Vitals:   04/26/18 1021  BP: 109/69  Pulse: 96  Weight: 170 lb 6.4 oz (77.3 kg)    Fetal Status: Fetal Heart Rate (bpm): 154   Movement: Present     General:  Alert, oriented and cooperative. Patient is in no acute distress.  Skin: Skin is warm and dry. No rash noted.   Cardiovascular: Normal heart rate noted  Respiratory: Normal respiratory effort, no problems with respiration noted  Abdomen: Soft, gravid, appropriate for gestational age.  Pain/Pressure: Absent     Pelvic: Cervical exam deferred        Extremities: Normal range of motion.  Edema: Trace  Mental Status: Normal mood and affect. Normal behavior. Normal judgment and thought content.   Assessment and Plan:  Pregnancy: [id number] at [redacted]w[redacted]d  1. Supervision of high risk pregnancy in second trimester   2. Group B Streptococcus urinary tract infection affecting pregnancy in third trimester - treat in labor   4. Anxiety and depression - on celexa 40 mg daily - Ambulatory referral to [facility]  Preterm labor symptoms and general obstetric  precautions including but not limited to vaginal bleeding, contractions, leaking of fluid and fetal movement were reviewed in detail with the patient. Please refer to After Visit Summary for other counseling recommendations.  Return in about 3 weeks (around 05/17/2018).  No future appointments.  Emily Filbert, MD

## 2018-04-30 ENCOUNTER — Institutional Professional Consult (permissible substitution): Payer: Self-pay

## 2018-04-30 NOTE — BH Specialist Note (Deleted)
Integrated Behavioral Health Initial Visit  MRN: [id number] Name: Alejandra Conway  Number of [facility] Clinician visits:: 1/6 Session Start time: ***  Session End time: *** Total time: {IBH Total Time:21014050}  Type of Service: [place] Interpretor:No. Interpretor Name and Language: n/a   Warm Hand Off Completed.       SUBJECTIVE: Alejandra Conway is a 32 y.o. female accompanied by {CHL AMB ACCOMPANIED VO:1607371062} Patient was referred by Clovia Cuff, MD for anxiety and depression. Patient reports the following symptoms/concerns: *** Duration of problem: ***; Severity of problem: {Mild/Moderate/Severe:20260}  OBJECTIVE: Mood: {BHH MOOD:22306} and Affect: {BHH AFFECT:22307} Risk of harm to self or others: {CHL AMB BH Suicide Current Mental Status:21022748}  LIFE CONTEXT: Family and Social: *** School/Work: *** Self-Care: *** Life Changes: ***  GOALS ADDRESSED: Patient will: 1. Reduce symptoms of: {IBH Symptoms:21014056} 2. Increase knowledge and/or ability of: {IBH Patient Tools:21014057}  3. Demonstrate ability to: {IBH Goals:21014053}  INTERVENTIONS: Interventions utilized: {IBH Interventions:21014054}  Standardized Assessments completed: {IBH Screening Tools:21014051}  ASSESSMENT: Patient currently experiencing ***.   Patient may benefit from ***.  PLAN: 1. Follow up with behavioral health clinician on : *** 2. Behavioral recommendations: *** 3. Referral(s): {IBH Referrals:21014055} 4. "From scale of 1-10, how likely are you to follow plan?": ***  Caroleen Hamman Alejandra Augsburger, LCSW

## 2018-05-17 ENCOUNTER — Encounter: Payer: Self-pay | Admitting: Obstetrics and Gynecology

## 2018-05-17 ENCOUNTER — Ambulatory Visit (INDEPENDENT_AMBULATORY_CARE_PROVIDER_SITE_OTHER): Payer: Self-pay | Admitting: Obstetrics and Gynecology

## 2018-05-17 VITALS — BP 107/70 | HR 70 | Wt 173.4 lb

## 2018-05-17 DIAGNOSIS — O0993 Supervision of high risk pregnancy, unspecified, third trimester: Secondary | ICD-10-CM

## 2018-05-17 DIAGNOSIS — O0992 Supervision of high risk pregnancy, unspecified, second trimester: Secondary | ICD-10-CM

## 2018-05-17 NOTE — Progress Notes (Signed)
Prenatal Visit Note Date: 05/17/2018 Clinic: [facility]  Subjective:  Alejandra Conway is a 32 y.o. [phone] at [redacted]w[redacted]d being seen today for ongoing prenatal care.  She is currently monitored for the following issues for this low-risk pregnancy and has ASTHMA, INTERMITTENT; Stress induced anxiety; Depression, Moderate; Migraine; Supervision of high risk pregnancy in second trimester; Neck pain, musculoskeletal; Anxiety and depression; and GBS (group B streptococcus) UTI complicating pregnancy on their problem list.  Patient reports no complaints.    . Vag. Bleeding: None.  Movement: Present. Denies leaking of fluid.   The following portions of the patient's history were reviewed and updated as appropriate: allergies, current medications, past family history, past medical history, past social history, past surgical history and problem list. Problem list updated.  Objective:   Vitals:   05/17/18 0941  BP: 107/70  Pulse: 70  Weight: 173 lb 6.4 oz (78.7 kg)    Fetal Status: Fetal Heart Rate (bpm): 143 Fundal Height: 37 cm Movement: Present  Presentation: Vertex  General:  Alert, oriented and cooperative. Patient is in no acute distress.  Skin: Skin is warm and dry. No rash noted.   Cardiovascular: Normal heart rate noted  Respiratory: Normal respiratory effort, no problems with respiration noted  Abdomen: Soft, gravid, appropriate for gestational age. Pain/Pressure: Present     Pelvic:  Cervical exam deferred        Extremities: Normal range of motion.     Mental Status: Normal mood and affect. Normal behavior. Normal judgment and thought content.   Urinalysis:      Assessment and Plan:  Pregnancy: A5W0981 at [redacted]w[redacted]d  1. Supervision of high risk pregnancy in second trimester Routine care. GC/CT nv. Ask about BC nv  Preterm labor symptoms and general obstetric precautions including but not limited to vaginal bleeding, contractions, leaking of fluid and fetal  movement were reviewed in detail with the patient. Please refer to After Visit Summary for other counseling recommendations.  Return in about 1 week (around 05/24/2018) for 7-10d rob.   Aletha Halim, MD

## 2018-05-24 ENCOUNTER — Ambulatory Visit (INDEPENDENT_AMBULATORY_CARE_PROVIDER_SITE_OTHER): Payer: Self-pay | Admitting: Obstetrics and Gynecology

## 2018-05-24 ENCOUNTER — Other Ambulatory Visit (HOSPITAL_COMMUNITY)
Admission: RE | Admit: 2018-05-24 | Discharge: 2018-05-24 | Disposition: A | Discharge status: Home / Self Care | Payer: Medicaid Other | Source: Ambulatory Visit | Attending: Obstetrics and Gynecology | Admitting: Obstetrics and Gynecology

## 2018-05-24 VITALS — BP 99/66 | HR 71 | Wt 174.0 lb

## 2018-05-24 DIAGNOSIS — O0992 Supervision of high risk pregnancy, unspecified, second trimester: Secondary | ICD-10-CM

## 2018-05-24 DIAGNOSIS — Z3A36 36 weeks gestation of pregnancy: Secondary | ICD-10-CM | POA: Diagnosis not present

## 2018-05-24 DIAGNOSIS — O0993 Supervision of high risk pregnancy, unspecified, third trimester: Secondary | ICD-10-CM

## 2018-05-24 NOTE — Progress Notes (Signed)
Prenatal Visit Note Date: 05/24/2018 Clinic: [facility]  Subjective:  Alejandra Conway is a 32 y.o. [phone] at [redacted]w[redacted]d being seen today for ongoing prenatal care.  She is currently monitored for the following issues for this low-risk pregnancy and has ASTHMA, INTERMITTENT; Stress induced anxiety; Depression, Moderate; Migraine; Supervision of high risk pregnancy in second trimester; Neck pain, musculoskeletal; Anxiety and depression; and GBS (group B streptococcus) UTI complicating pregnancy on their problem list.  Patient reports no complaints.   Contractions: Irregular. Vag. Bleeding: None.  Movement: Present. Denies leaking of fluid.   The following portions of the patient's history were reviewed and updated as appropriate: allergies, current medications, past family history, past medical history, past social history, past surgical history and problem list. Problem list updated.  Objective:   Vitals:   05/24/18 1012  BP: 99/66  Pulse: 71  Weight: 174 lb (78.9 kg)    Fetal Status: Fetal Heart Rate (bpm): 135 Fundal Height: 37 cm Movement: Present  Presentation: Vertex  General:  Alert, oriented and cooperative. Patient is in no acute distress.  Skin: Skin is warm and dry. No rash noted.   Cardiovascular: Normal heart rate noted  Respiratory: Normal respiratory effort, no problems with respiration noted  Abdomen: Soft, gravid, appropriate for gestational age. Pain/Pressure: Present     Pelvic:  Cervical exam performed Dilation: 1 Effacement (%): 50 Station: -3  Extremities: Normal range of motion.     Mental Status: Normal mood and affect. Normal behavior. Normal judgment and thought content.   Urinalysis:      Assessment and Plan:  Pregnancy: [id number] at [redacted]w[redacted]d  1. Supervision of high risk pregnancy in second trimester Routine care. S/p vasectomy in early July already - Cervicovaginal ancillary only  Preterm labor symptoms and general obstetric  precautions including but not limited to vaginal bleeding, contractions, leaking of fluid and fetal movement were reviewed in detail with the patient. Please refer to After Visit Summary for other counseling recommendations.  Return in about 1 week (around 05/31/2018) for 7-10d rob.   Aletha Halim, MD

## 2018-05-27 ENCOUNTER — Encounter: Payer: Self-pay | Admitting: Family Medicine

## 2018-05-27 LAB — CERVICOVAGINAL ANCILLARY ONLY
CHLAMYDIA, DNA PROBE: NEGATIVE
NEISSERIA GONORRHEA: NEGATIVE

## 2018-05-31 ENCOUNTER — Ambulatory Visit (INDEPENDENT_AMBULATORY_CARE_PROVIDER_SITE_OTHER): Payer: Self-pay | Admitting: Obstetrics and Gynecology

## 2018-05-31 VITALS — BP 98/64 | HR 77 | Wt 175.4 lb

## 2018-05-31 DIAGNOSIS — Z3493 Encounter for supervision of normal pregnancy, unspecified, third trimester: Secondary | ICD-10-CM

## 2018-05-31 NOTE — Progress Notes (Signed)
Prenatal Visit Note Date: 05/31/2018 Clinic: [facility]  Subjective:  Alejandra Conway is a 32 y.o. [phone] at [redacted]w[redacted]d being seen today for ongoing prenatal care.  She is currently monitored for the following issues for this low-risk pregnancy and has ASTHMA, INTERMITTENT; Stress induced anxiety; Depression, Moderate; Migraine; Supervision of high risk pregnancy in second trimester; Neck pain, musculoskeletal; Anxiety and depression; and GBS (group B streptococcus) UTI complicating pregnancy on their problem list.  Patient reports no complaints.   Contractions: Irregular. Vag. Bleeding: None.  Movement: Present. Denies leaking of fluid.   The following portions of the patient's history were reviewed and updated as appropriate: allergies, current medications, past family history, past medical history, past social history, past surgical history and problem list. Problem list updated.  Objective:   Vitals:   05/31/18 0945  BP: 98/64  Pulse: 77  Weight: 175 lb 6.4 oz (79.6 kg)    Fetal Status: Fetal Heart Rate (bpm): 141 Fundal Height: 37 cm Movement: Present  Presentation: Vertex  General:  Alert, oriented and cooperative. Patient is in no acute distress.  Skin: Skin is warm and dry. No rash noted.   Cardiovascular: Normal heart rate noted  Respiratory: Normal respiratory effort, no problems with respiration noted  Abdomen: Soft, gravid, appropriate for gestational age. Pain/Pressure: Present     Pelvic:  Cervical exam deferred        Extremities: Normal range of motion.     Mental Status: Normal mood and affect. Normal behavior. Normal judgment and thought content.   Urinalysis:      Assessment and Plan:  Pregnancy: [id number] at [redacted]w[redacted]d  Routine care  Term labor symptoms and general obstetric precautions including but not limited to vaginal bleeding, contractions, leaking of fluid and fetal movement were reviewed in detail with the patient. Please refer to  After Visit Summary for other counseling recommendations.  Return in about 1 week (around 06/07/2018) for 7-10d rob.   Aletha Halim, MD

## 2018-06-05 ENCOUNTER — Ambulatory Visit (INDEPENDENT_AMBULATORY_CARE_PROVIDER_SITE_OTHER): Payer: Self-pay | Admitting: Family Medicine

## 2018-06-05 ENCOUNTER — Other Ambulatory Visit: Payer: Self-pay | Admitting: Family Medicine

## 2018-06-05 VITALS — BP 103/64 | HR 85 | Wt 176.0 lb

## 2018-06-05 DIAGNOSIS — B951 Streptococcus, group B, as the cause of diseases classified elsewhere: Secondary | ICD-10-CM

## 2018-06-05 DIAGNOSIS — Z3493 Encounter for supervision of normal pregnancy, unspecified, third trimester: Secondary | ICD-10-CM

## 2018-06-05 DIAGNOSIS — O2343 Unspecified infection of urinary tract in pregnancy, third trimester: Secondary | ICD-10-CM

## 2018-06-05 DIAGNOSIS — F321 Major depressive disorder, single episode, moderate: Secondary | ICD-10-CM

## 2018-06-05 NOTE — Progress Notes (Signed)
   PRENATAL VISIT NOTE  Subjective:  Alejandra Conway is a 32 y.o. [id number] at [redacted]w[redacted]d being seen today for ongoing prenatal care.  She is currently monitored for the following issues for this low-risk pregnancy and has ASTHMA, INTERMITTENT; Stress induced anxiety; Depression, Moderate; Migraine; Supervision of low-risk pregnancy, third trimester; Neck pain, musculoskeletal; Anxiety and depression; and GBS (group B streptococcus) UTI complicating pregnancy on their problem list.  Patient reports no complaints.  Contractions: Irregular. Vag. Bleeding: None.  Movement: Present. Denies leaking of fluid.   The following portions of the patient's history were reviewed and updated as appropriate: allergies, current medications, past family history, past medical history, past social history, past surgical history and problem list. Problem list updated.  Objective:   Vitals:   06/05/18 0910  BP: 103/64  Pulse: 85  Weight: 176 lb (79.8 kg)    Fetal Status: Fetal Heart Rate (bpm): 144 Fundal Height: 39 cm Movement: Present  Presentation: Vertex  General:  Alert, oriented and cooperative. Patient is in no acute distress.  Skin: Skin is warm and dry. No rash noted.   Cardiovascular: Normal heart rate noted  Respiratory: Normal respiratory effort, no problems with respiration noted  Abdomen: Soft, gravid, appropriate for gestational age.  Pain/Pressure: Present     Pelvic: Declined cervical check today        Extremities: Normal range of motion.  Edema: Trace  Mental Status: Normal mood and affect. Normal behavior. Normal judgment and thought content.   Assessment and Plan:  Pregnancy: [id number] at [redacted]w[redacted]d  1. Group B Streptococcus urinary tract infection affecting pregnancy in third trimester PCN in labor  2. Supervision of low-risk pregnancy, third trimester UTD currently, good growth. Declines cervical exam toda. Discussed 41 wk IOL. Form filled out  3. Depression, Moderate Monitor pp,  recommend home visit and 2 week mood check  Term labor symptoms and general obstetric precautions including but not limited to vaginal bleeding, contractions, leaking of fluid and fetal movement were reviewed in detail with the patient. Please refer to After Visit Summary for other counseling recommendations.  Return in about 1 week (around 06/12/2018) for Routine prenatal care.  Future Appointments  Date Time Provider [place]  06/12/2018  8:45 AM Darlina Rumpf, CNM [facility]    Caren Macadam, MD

## 2018-06-08 ENCOUNTER — Encounter (HOSPITAL_COMMUNITY): Payer: Self-pay | Admitting: *Deleted

## 2018-06-08 ENCOUNTER — Inpatient Hospital Stay (HOSPITAL_COMMUNITY)
Admission: AD | Admit: 2018-06-08 | Discharge: 2018-06-08 | Disposition: A | Discharge status: Home / Self Care | Payer: Medicaid Other | Source: Ambulatory Visit | Attending: Family Medicine | Admitting: Family Medicine

## 2018-06-08 ENCOUNTER — Other Ambulatory Visit: Payer: Self-pay

## 2018-06-08 DIAGNOSIS — O368130 Decreased fetal movements, third trimester, not applicable or unspecified: Secondary | ICD-10-CM | POA: Diagnosis not present

## 2018-06-08 DIAGNOSIS — Z3689 Encounter for other specified antenatal screening: Secondary | ICD-10-CM

## 2018-06-08 DIAGNOSIS — Z3A38 38 weeks gestation of pregnancy: Secondary | ICD-10-CM | POA: Insufficient documentation

## 2018-06-08 NOTE — MAU Provider Note (Signed)
History   [id number]   Chief Complaint  Patient presents with  . Decreased Fetal Movement    HPI Alejandra Conway is a 32 y.o. female  [phone] here with report of decreased fetal movement since this morning.  Reports feeling the baby move approximately 0 times in the past 24 hour.  Denies vaginal bleeding or leaking of fluid.  Feels occasional contraction.  Patient's last menstrual period was 08/21/2017.  OB History  Gravida Para Term Preterm AB Living  4 2 2  0 1 2  SAB TAB Ectopic Multiple Live Births  0 1 0 0 2    # Outcome Date GA Lbr Len/2nd Weight Sex Delivery Anes PTL Lv  4 Current           3 Term 04/12/14 [redacted]w[redacted]d 07:29 / 00:06 3055 g M Vag-Spont None  LIV  2 Term 02/18/11 [redacted]w[redacted]d  3090 g M Vag-Spont None N LIV  1 TAB 2010 [redacted]w[redacted]d   U    DEC    Past Medical History:  Diagnosis Date  . Abnormal Pap smear   . Asthma   . Depression with anxiety 05/03/2015  . Migraine     Family History  Problem Relation Age of Onset  . Heart disease Mother   . Alkaptonuria Son   . Heart disease Paternal Uncle   . Cancer Maternal Grandfather     Social History   Socioeconomic History  . Marital status: Married    Spouse name: Not on file  . Number of children: Not on file  . Years of education: Not on file  . Highest education level: Not on file  Occupational History  . Not on file  Social Needs  . Financial resource strain: Not on file  . Food insecurity:    Worry: Not on file    Inability: Not on file  . Transportation needs:    Medical: Not on file    Non-medical: Not on file  Tobacco Use  . Smoking status: Never Smoker  . Smokeless tobacco: Never Used  Substance and Sexual Activity  . Alcohol use: No  . Drug use: No  . Sexual activity: Not Currently    Birth control/protection: None  Lifestyle  . Physical activity:    Days per week: Not on file    Minutes per session: Not on file  . Stress: Not on file  Relationships  . Social connections:    Talks on  phone: Not on file    Gets together: Not on file    Attends religious service: Not on file    Active member of club or organization: Not on file    Attends meetings of clubs or organizations: Not on file    Relationship status: Not on file  Other Topics Concern  . Not on file  Social History Narrative  . Not on file    Allergies  Allergen Reactions  . Pseudoephedrine     REACTION: shortness of breath    No current facility-administered medications on file prior to encounter.    Current Outpatient Medications on File Prior to Encounter  Medication Sig Dispense Refill  . albuterol (PROVENTIL HFA;VENTOLIN HFA) 108 (90 Base) MCG/ACT inhaler Inhale 2 puffs into the lungs every 6 (six) hours as needed for shortness of breath (with exercise). 1 Inhaler 4  . citalopram (CELEXA) 40 MG tablet Take 1 tablet (40 mg total) by mouth daily. 90 tablet 1  . Prenatal MV & Min w/FA-DHA (ONE A DAY PRENATAL PO) Take  1 capsule by mouth daily.       Review of Systems  Constitutional: Negative.   Gastrointestinal: Negative.   Genitourinary: Negative.    Physical Exam   Vitals:   06/08/18 1743  BP: 130/69  Pulse: 93  Resp: 18  Temp: 98.4 F (36.9 C)    Physical Exam  Nursing note and vitals reviewed. Constitutional: She is oriented to person, place, and time. She appears well-developed and well-nourished. No distress.  HENT:  Head: Normocephalic and atraumatic.  Eyes: Conjunctivae are normal. Right eye exhibits no discharge. Left eye exhibits no discharge. No scleral icterus.  Respiratory: Effort normal. No respiratory distress.  Neurological: She is alert and oriented to person, place, and time.  Skin: She is not diaphoretic.  Psychiatric: She has a normal mood and affect. Her behavior is normal. Judgment and thought content normal.   NST:  Baseline: 130 bpm, Variability: Good {> 6 bpm), Accelerations: Reactive and Decelerations: Absent  MAU Course  Procedures  MDM Pt reports  return of fetal movement since being in MAU & placed on monitors.  Reactive NST Movement audible on monitor   Assessment and Plan  A: 1. Decreased fetal movements in third trimester, single or unspecified fetus   2. [redacted] weeks gestation of pregnancy   3. NST (non-stress test) reactive    P: Discharge home Fetal movement form F/u with OB Discussed reasons to return to MAU   Jorje Guild, NP 06/08/2018 6:10 PM

## 2018-06-08 NOTE — MAU Note (Signed)
Pt presents to MAU with a decrease in fetal movement today. Denies any VB or LOF

## 2018-06-08 NOTE — Discharge Instructions (Signed)

## 2018-06-12 ENCOUNTER — Ambulatory Visit (INDEPENDENT_AMBULATORY_CARE_PROVIDER_SITE_OTHER): Payer: Self-pay | Admitting: Advanced Practice Midwife

## 2018-06-12 VITALS — BP 100/62 | HR 96 | Wt 175.0 lb

## 2018-06-12 DIAGNOSIS — Z3493 Encounter for supervision of normal pregnancy, unspecified, third trimester: Secondary | ICD-10-CM

## 2018-06-12 DIAGNOSIS — R8271 Bacteriuria: Secondary | ICD-10-CM

## 2018-06-12 DIAGNOSIS — F321 Major depressive disorder, single episode, moderate: Secondary | ICD-10-CM

## 2018-06-12 NOTE — Progress Notes (Signed)
   PRENATAL VISIT NOTE  Subjective:  Alejandra Conway is a 32 y.o. [id number] at [redacted]w[redacted]d being seen today for ongoing prenatal care.  She is currently monitored for the following issues for this low-risk pregnancy and has ASTHMA, INTERMITTENT; Stress induced anxiety; Depression, Moderate; Migraine; Supervision of low-risk pregnancy, third trimester; Neck pain, musculoskeletal; Anxiety and depression; and GBS (group B streptococcus) UTI complicating pregnancy on their problem list.  Patient reports no complaints.  Contractions: Irregular. Vag. Bleeding: None.  Movement: Present. Denies leaking of fluid.   The following portions of the patient's history were reviewed and updated as appropriate: allergies, current medications, past family history, past medical history, past social history, past surgical history and problem list. Problem list updated.  Objective:   Vitals:   06/12/18 0909  BP: 100/62  Pulse: 96  Weight: 175 lb (79.4 kg)    Fetal Status: Fetal Heart Rate (bpm): 152 Fundal Height: 40 cm Movement: Present  Presentation: Vertex  General:  Alert, oriented and cooperative. Patient is in no acute distress.  Skin: Skin is warm and dry. No rash noted.   Cardiovascular: Normal heart rate noted  Respiratory: Normal respiratory effort, no problems with respiration noted  Abdomen: Soft, gravid, appropriate for gestational age.  Pain/Pressure: Present     Pelvic: Cervical exam performed Dilation: 1 Effacement (%): 50 Station: -3 unchanged from two weeks ago  Extremities: Normal range of motion.  Edema: Trace  Mental Status: Normal mood and affect. Normal behavior. Normal judgment and thought content.   Assessment and Plan:  Pregnancy: [id number] at 110w3d  1. Supervision of low-risk pregnancy, third trimester --No complaints or concerns, continue routine care  --Discussed sweeping membranes, declines today. Re-address at next visit PRN  --OB visit and NST next week if not  delivered --Reviewed methods for IOL, plan for FB or combination of FB, Cytotec, Pitocin PRN. Provided general overview --IOL scheduled for 0730 on 06/23/2018, orders in chart, placed by Lubertha Sayres, MD last week  2. GBS bacteriuria --Discussed prophylaxis in labor  3. Depression, Moderate --Plan for 2 week pp mood check  Term labor symptoms and general obstetric precautions including but not limited to vaginal bleeding, contractions, leaking of fluid and fetal movement were reviewed in detail with the patient. Please refer to After Visit Summary for other counseling recommendations.  Return in about 1 week (around 06/19/2018).  Future Appointments  Date Time Provider [place]  06/20/2018  9:30 AM Aletha Halim, MD [facility]    Darlina Rumpf, [place] 06/12/18  9:53 AM

## 2018-06-12 NOTE — Patient Instructions (Signed)
Labor Induction Labor induction is when steps are taken to cause a pregnant woman to begin the labor process. Most women go into labor on their own between 37 weeks and 42 weeks of the pregnancy. When this does not happen or when there is a medical need, methods may be used to induce labor. Labor induction causes a pregnant woman's uterus to contract. It also causes the cervix to soften (ripen), open (dilate), and thin out (efface). Usually, labor is not induced before 39 weeks of the pregnancy unless there is a problem with the baby or mother. Before inducing labor, your health care provider will consider a number of factors, including the following:  The medical condition of you and the baby.  How many weeks along you are.  The status of the baby's lung maturity.  The condition of the cervix.  The position of the baby. What are the reasons for labor induction? Labor may be induced for the following reasons:  The health of the baby or mother is at risk.  The pregnancy is overdue by 1 week or more.  The water breaks but labor does not start on its own.  The mother has a health condition or serious illness, such as high blood pressure, infection, placental abruption, or diabetes.  The amniotic fluid amounts are low around the baby.  The baby is distressed. Convenience or wanting the baby to be born on a certain date is not a reason for inducing labor. What methods are used for labor induction? Several methods of labor induction may be used, such as:  Prostaglandin medicine. This medicine causes the cervix to dilate and ripen. The medicine will also start contractions. It can be taken by mouth or by inserting a suppository into the vagina.  Inserting a thin tube (catheter) with a balloon on the end into the vagina to dilate the cervix. Once inserted, the balloon is expanded with water, which causes the cervix to open.  Stripping the membranes. Your health care provider separates  amniotic sac tissue from the cervix, causing the cervix to be stretched and causing the release of a hormone called progesterone. This may cause the uterus to contract. It is often done during an office visit. You will be sent home to wait for the contractions to begin. You will then come in for an induction.  Breaking the water. Your health care provider makes a hole in the amniotic sac using a small instrument. Once the amniotic sac breaks, contractions should begin. This may still take hours to see an effect.  Medicine to trigger or strengthen contractions. This medicine is given through an IV access tube inserted into a vein in your arm. All of the methods of induction, besides stripping the membranes, will be done in the hospital. Induction is done in the hospital so that you and the baby can be carefully monitored. How long does it take for labor to be induced? Some inductions can take up to 2-3 days. Depending on the cervix, it usually takes less time. It takes longer when you are induced early in the pregnancy or if this is your first pregnancy. If a mother is still pregnant and the induction has been going on for 2-3 days, either the mother will be sent home or a cesarean delivery will be needed. What are the risks associated with labor induction? Some of the risks of induction include:  Changes in fetal heart rate, such as too high, too low, or erratic.  Fetal distress.    Chance of infection for the mother and baby.  Increased chance of having a cesarean delivery.  Breaking off (abruption) of the placenta from the uterus (rare).  Uterine rupture (very rare). When induction is needed for medical reasons, the benefits of induction may outweigh the risks. What are some reasons for not inducing labor? Labor induction should not be done if:  It is shown that your baby does not tolerate labor.  You have had previous surgeries on your uterus, such as a myomectomy or the removal of  fibroids.  Your placenta lies very low in the uterus and blocks the opening of the cervix (placenta previa).  Your baby is not in a head-down position.  The umbilical cord drops down into the birth canal in front of the baby. This could cut off the baby's blood and oxygen supply.  You have had a previous cesarean delivery.  There are unusual circumstances, such as the baby being extremely premature. This information is not intended to replace advice given to you by your health care provider. Make sure you discuss any questions you have with your health care provider. Document Released: 01/17/2007 Document Revised: 02/03/2016 Document Reviewed: 03/27/2013 Elsevier Interactive Patient Education  2017 [employer]  

## 2018-06-17 ENCOUNTER — Inpatient Hospital Stay (HOSPITAL_COMMUNITY)
Admission: AD | Admit: 2018-06-17 | Discharge: 2018-06-19 | DRG: 807 | Disposition: A | Discharge status: Home / Self Care | Payer: Medicaid Other | Attending: Obstetrics and Gynecology | Admitting: Obstetrics and Gynecology

## 2018-06-17 ENCOUNTER — Encounter (HOSPITAL_COMMUNITY): Payer: Self-pay

## 2018-06-17 ENCOUNTER — Other Ambulatory Visit: Payer: Self-pay

## 2018-06-17 DIAGNOSIS — O99344 Other mental disorders complicating childbirth: Secondary | ICD-10-CM | POA: Diagnosis present

## 2018-06-17 DIAGNOSIS — O480 Post-term pregnancy: Principal | ICD-10-CM | POA: Diagnosis present

## 2018-06-17 DIAGNOSIS — O9952 Diseases of the respiratory system complicating childbirth: Secondary | ICD-10-CM | POA: Diagnosis present

## 2018-06-17 DIAGNOSIS — Z3A40 40 weeks gestation of pregnancy: Secondary | ICD-10-CM

## 2018-06-17 DIAGNOSIS — J4520 Mild intermittent asthma, uncomplicated: Secondary | ICD-10-CM | POA: Diagnosis present

## 2018-06-17 DIAGNOSIS — O99824 Streptococcus B carrier state complicating childbirth: Secondary | ICD-10-CM | POA: Diagnosis not present

## 2018-06-17 DIAGNOSIS — F418 Other specified anxiety disorders: Secondary | ICD-10-CM | POA: Diagnosis present

## 2018-06-17 LAB — CBC
HCT: 35.7 % — ABNORMAL LOW (ref 36.0–46.0)
HEMOGLOBIN: 11.7 g/dL — AB (ref 12.0–15.0)
MCH: 29 pg (ref 26.0–34.0)
MCHC: 32.8 g/dL (ref 30.0–36.0)
MCV: 88.6 fL (ref 78.0–100.0)
Platelets: 170 10*3/uL (ref 150–400)
RBC: 4.03 MIL/uL (ref 3.87–5.11)
RDW: 15 % (ref 11.5–15.5)
WBC: 8.8 10*3/uL (ref 4.0–10.5)

## 2018-06-17 LAB — TYPE AND SCREEN
ABO/RH(D): O POS
Antibody Screen: NEGATIVE

## 2018-06-17 LAB — RPR: RPR: NONREACTIVE

## 2018-06-17 LAB — ABO/RH: ABO/RH(D): O POS

## 2018-06-17 LAB — POCT FERN TEST: POCT Fern Test: POSITIVE

## 2018-06-17 MED ORDER — COCONUT OIL OIL
1.0000 "application " | TOPICAL_OIL | Status: DC | PRN
  Administered 2018-06-18: 1 via TOPICAL
  Filled 2018-06-17: qty 120

## 2018-06-17 MED ORDER — LACTATED RINGERS IV SOLN
INTRAVENOUS | Status: DC
  Administered 2018-06-17: 04:00:00 via INTRAVENOUS

## 2018-06-17 MED ORDER — TETANUS-DIPHTH-ACELL PERTUSSIS 5-2.5-18.5 LF-MCG/0.5 IM SUSP: 0.5000 mL | Freq: Once | INTRAMUSCULAR | Status: DC

## 2018-06-17 MED ORDER — SIMETHICONE 80 MG PO CHEW: 80.0000 mg | CHEWABLE_TABLET | ORAL | Status: DC | PRN

## 2018-06-17 MED ORDER — TERBUTALINE SULFATE 1 MG/ML IJ SOLN: 0.2500 mg | Freq: Once | INTRAMUSCULAR | Status: DC | PRN

## 2018-06-17 MED ORDER — SOD CITRATE-CITRIC ACID 500-334 MG/5ML PO SOLN: 30.0000 mL | ORAL | Status: DC | PRN

## 2018-06-17 MED ORDER — ACETAMINOPHEN 325 MG PO TABS: 650.0000 mg | ORAL_TABLET | ORAL | Status: DC | PRN

## 2018-06-17 MED ORDER — TERBUTALINE SULFATE 1 MG/ML IJ SOLN
0.2500 mg | Freq: Once | INTRAMUSCULAR | Status: DC | PRN
  Filled 2018-06-17: qty 1

## 2018-06-17 MED ORDER — OXYCODONE-ACETAMINOPHEN 5-325 MG PO TABS: 2.0000 | ORAL_TABLET | ORAL | Status: DC | PRN

## 2018-06-17 MED ORDER — PENICILLIN G 3 MILLION UNITS IVPB - SIMPLE MED
3.0000 10*6.[IU] | INTRAVENOUS | Status: DC
  Administered 2018-06-17 (×2): 3 10*6.[IU] via INTRAVENOUS
  Filled 2018-06-17 (×9): qty 100

## 2018-06-17 MED ORDER — OXYTOCIN 40 UNITS IN LACTATED RINGERS INFUSION - SIMPLE MED: 2.5000 [IU]/h | INTRAVENOUS | Status: DC

## 2018-06-17 MED ORDER — IBUPROFEN 600 MG PO TABS
600.0000 mg | ORAL_TABLET | Freq: Four times a day (QID) | ORAL | Status: DC
  Administered 2018-06-18 – 2018-06-19 (×7): 600 mg via ORAL
  Filled 2018-06-17 (×8): qty 1

## 2018-06-17 MED ORDER — OXYTOCIN 40 UNITS IN LACTATED RINGERS INFUSION - SIMPLE MED
1.0000 m[IU]/min | INTRAVENOUS | Status: DC
  Administered 2018-06-17: 2 m[IU]/min via INTRAVENOUS
  Filled 2018-06-17: qty 1000

## 2018-06-17 MED ORDER — BENZOCAINE-MENTHOL 20-0.5 % EX AERO: 1.0000 "application " | INHALATION_SPRAY | CUTANEOUS | Status: DC | PRN

## 2018-06-17 MED ORDER — PRENATAL MULTIVITAMIN CH
1.0000 | ORAL_TABLET | Freq: Every day | ORAL | Status: DC
  Administered 2018-06-18 – 2018-06-19 (×2): 1 via ORAL
  Filled 2018-06-17 (×2): qty 1

## 2018-06-17 MED ORDER — ONDANSETRON HCL 4 MG PO TABS: 4.0000 mg | ORAL_TABLET | ORAL | Status: DC | PRN

## 2018-06-17 MED ORDER — MISOPROSTOL 25 MCG QUARTER TABLET: 25.0000 ug | ORAL_TABLET | ORAL | Status: DC | PRN

## 2018-06-17 MED ORDER — DIBUCAINE 1 % RE OINT: 1.0000 "application " | TOPICAL_OINTMENT | RECTAL | Status: DC | PRN

## 2018-06-17 MED ORDER — OXYTOCIN BOLUS FROM INFUSION
500.0000 mL | Freq: Once | INTRAVENOUS | Status: AC
  Administered 2018-06-17: 500 mL via INTRAVENOUS

## 2018-06-17 MED ORDER — CITALOPRAM HYDROBROMIDE 40 MG PO TABS
40.0000 mg | ORAL_TABLET | Freq: Every day | ORAL | Status: DC
  Administered 2018-06-18 – 2018-06-19 (×2): 40 mg via ORAL
  Filled 2018-06-17 (×4): qty 1

## 2018-06-17 MED ORDER — ONDANSETRON HCL 4 MG/2ML IJ SOLN: 4.0000 mg | INTRAMUSCULAR | Status: DC | PRN

## 2018-06-17 MED ORDER — OXYCODONE-ACETAMINOPHEN 5-325 MG PO TABS: 1.0000 | ORAL_TABLET | ORAL | Status: DC | PRN

## 2018-06-17 MED ORDER — SODIUM CHLORIDE 0.9 % IV SOLN
5.0000 10*6.[IU] | Freq: Once | INTRAVENOUS | Status: AC
  Administered 2018-06-17: 5 10*6.[IU] via INTRAVENOUS
  Filled 2018-06-17: qty 5

## 2018-06-17 MED ORDER — WITCH HAZEL-GLYCERIN EX PADS: 1.0000 "application " | MEDICATED_PAD | CUTANEOUS | Status: DC | PRN

## 2018-06-17 MED ORDER — ONDANSETRON HCL 4 MG/2ML IJ SOLN: 4.0000 mg | Freq: Four times a day (QID) | INTRAMUSCULAR | Status: DC | PRN

## 2018-06-17 MED ORDER — LACTATED RINGERS IV SOLN: 500.0000 mL | INTRAVENOUS | Status: DC | PRN

## 2018-06-17 MED ORDER — LIDOCAINE HCL (PF) 1 % IJ SOLN
30.0000 mL | INTRAMUSCULAR | Status: AC | PRN
  Administered 2018-06-17: 30 mL via SUBCUTANEOUS
  Filled 2018-06-17: qty 30

## 2018-06-17 MED ORDER — DIPHENHYDRAMINE HCL 25 MG PO CAPS: 25.0000 mg | ORAL_CAPSULE | Freq: Four times a day (QID) | ORAL | Status: DC | PRN

## 2018-06-17 MED ORDER — SENNOSIDES-DOCUSATE SODIUM 8.6-50 MG PO TABS
2.0000 | ORAL_TABLET | ORAL | Status: DC
  Administered 2018-06-18 – 2018-06-19 (×2): 2 via ORAL
  Filled 2018-06-17 (×2): qty 2

## 2018-06-17 MED ORDER — ZOLPIDEM TARTRATE 5 MG PO TABS: 5.0000 mg | ORAL_TABLET | Freq: Every evening | ORAL | Status: DC | PRN

## 2018-06-17 NOTE — Anesthesia Pain Management Evaluation Note (Signed)
  [facility] Pain Management Visit Note  Patient: Alejandra Conway, 32 y.o., female  "Hello I am a member of the anesthesia team at [facility]. We have an anesthesia team available at all times to provide care throughout the hospital, including epidural management and anesthesia for C-section. I don't know your plan for the delivery whether it a natural birth, water birth, IV sedation, nitrous supplementation, doula or epidural, but we want to meet your pain goals."   1.Was your pain managed to your expectations on prior hospitalizations?   Yes   2.What is your expectation for pain management during this hospitalization?     Labor support without medications  3.How can we help you reach that goal? unsure  Record the patient's initial score and the patient's pain goal.   Pain: 3  Pain Goal: 10 [facility] wants you to be able to say your pain was always managed very well.  Casimer Lanius 06/17/2018

## 2018-06-17 NOTE — Progress Notes (Signed)
Patient ID: Alejandra Conway, female   DOB: [birth year], 30 y.o.   MRN: [id number] Patient has decided to proceed with augmentation Vitals:   06/17/18 0953 06/17/18 1315 06/17/18 1512 06/17/18 1634  BP: (!) 101/56 119/64 115/61 116/72  Pulse: 76 73 74 79  Resp: 16 16 16 20   Temp: 97.7 F (36.5 C) 98.2 F (36.8 C) 97.9 F (36.6 C)   TempSrc: Oral Oral Oral   Weight:      Height:       FHR statble UCs spaced out  Dilation: 4.5 Effacement (%): 90 Cervical Position: Posterior Station: -1 Presentation: Vertex Exam by:: jaton burgess rnc  Start Pitocin per protocol

## 2018-06-17 NOTE — H&P (Signed)
LABOR AND DELIVERY ADMISSION HISTORY AND PHYSICAL NOTE  Alejandra Conway is a 32 y.o. female [phone] with IUP at [redacted]w[redacted]d by LMP presenting for SROM@ 0030 on 10/7. She reports positive fetal movement. She denies vaginal bleeding. She reports contractions occurring every 5-10 minutes, that have increased in intensity since arrival   Prenatal History/Complications: PNC at [place] Pregnancy complications:  - Anxiety and depression, does not want discussed in front of husband  - GBS UTI complicating pregnancy   Past Medical History: Past Medical History:  Diagnosis Date  . Abnormal Pap smear   . Asthma   . Depression with anxiety 05/03/2015  . Migraine     Past Surgical History: History reviewed. No pertinent surgical history.  Obstetrical History: OB History    Gravida  4   Para  2   Term  2   Preterm  0   AB  1   Living  2     SAB  0   TAB  1   Ectopic  0   Multiple  0   Live Births  2           Social History: Social History   Socioeconomic History  . Marital status: Married    Spouse name: Not on file  . Number of children: Not on file  . Years of education: Not on file  . Highest education level: Not on file  Occupational History  . Not on file  Social Needs  . Financial resource strain: Not on file  . Food insecurity:    Worry: Not on file    Inability: Not on file  . Transportation needs:    Medical: Not on file    Non-medical: Not on file  Tobacco Use  . Smoking status: Never Smoker  . Smokeless tobacco: Never Used  Substance and Sexual Activity  . Alcohol use: No  . Drug use: No  . Sexual activity: Not Currently    Birth control/protection: None  Lifestyle  . Physical activity:    Days per week: Not on file    Minutes per session: Not on file  . Stress: Not on file  Relationships  . Social connections:    Talks on phone: Not on file    Gets together: Not on file    Attends religious service: Not on file    Active member of  club or organization: Not on file    Attends meetings of clubs or organizations: Not on file    Relationship status: Not on file  Other Topics Concern  . Not on file  Social History Narrative  . Not on file    Family History: Family History  Problem Relation Age of Onset  . Heart disease Mother   . Alkaptonuria Son   . Heart disease Paternal Uncle   . Cancer Maternal Grandfather     Allergies: Allergies  Allergen Reactions  . Pseudoephedrine     REACTION: shortness of breath    Medications Prior to Admission  Medication Sig Dispense Refill Last Dose  . citalopram (CELEXA) 40 MG tablet Take 1 tablet (40 mg total) by mouth daily. 90 tablet 1 06/16/2018 at Unknown time  . Prenatal MV & Min w/FA-DHA (ONE A DAY PRENATAL PO) Take 1 capsule by mouth daily.   06/16/2018 at Unknown time  . albuterol (PROVENTIL HFA;VENTOLIN HFA) 108 (90 Base) MCG/ACT inhaler Inhale 2 puffs into the lungs every 6 (six) hours as needed for shortness of breath (with exercise). 1 Inhaler  4 More than a month at Unknown time     Review of Systems  All systems reviewed and negative except as stated in HPI  Physical Exam Blood pressure 107/67, pulse 80, temperature 97.8 F (36.6 C), temperature source Oral, resp. rate 18, weight 80.3 kg, last menstrual period 08/21/2017, unknown if currently breastfeeding. General appearance: alert, cooperative and no distress Lungs: clear to auscultation bilaterally Heart: regular rate and rhythm Abdomen: soft, non-tender; bowel sounds normal Extremities: No calf swelling or tenderness Presentation: cephalic by cervical examination  Fetal monitoring: 130/ moderate/ +accels/ no deceleration  Uterine activity: 3-5 minutes/ mild-moderate  Dilation: 3 Effacement (%): 80 Station: -2 Exam by:: Darrol Poke, CNM  Prenatal labs: ABO, Rh: --/--/O POS (10/07 0421) Antibody: NEG (10/07 0421) Rubella: 1.05 (01/29 0848) RPR: Non Reactive (07/24 0835)  HBsAg: Negative  (01/29 0848)  HIV: Non Reactive (07/24 0835)  GC/Chlamydia: Negative (9/13) GBS:   Positive- urine 2 hr Glucola: 71-97-87, normal  Genetic screening:  Declined  Anatomy US: female   Clinic  [facility] - Transferred to [place], [facility] Prenatal Labs  Dating LMP Blood type: O/Positive/-- (01/29 0848)   Genetic Screen declined Antibody:Negative (01/29 0848)  Anatomic [place]  normal through health dept Rubella: 1.05 (01/29 0848)  GTT  Third trimester: anemia RPR: Non Reactive (01/29 0848)   Flu vaccine  Declined 04/26/18 HBsAg: Negative (01/29 0848)   TDaP vaccine  04/03/18 HIV: Non Reactive (01/29 0848)  Baby Food Breast GBS:  positive in urine  Contraception Husband had vasectomy 7/19 Pap: normal  Circumcision Desires   Pediatrician [facility] CF:  Support Person  Husband Alejandra Conway SMA  Prenatal Classes No  Hgb electrophoresis:   Prenatal Transfer Tool  Maternal Diabetes: No Genetic Screening: Declined Maternal Ultrasounds/Referrals: Normal Fetal Ultrasounds or other Referrals:  None Maternal Substance Abuse:  No Significant Maternal Medications:  None Significant Maternal Lab Results: Lab values include: Group B Strep positive  Results for orders placed or performed during the hospital encounter of 06/17/18 (from the past 24 hour(s))  CBC   Collection Time: 06/17/18  4:21 AM  Result Value Ref Range   WBC 8.8 4.0 - 10.5 K/uL   RBC 4.03 3.87 - 5.11 MIL/uL   Hemoglobin 11.7 (L) 12.0 - 15.0 g/dL   HCT 35.7 (L) 36.0 - 46.0 %   MCV 88.6 78.0 - 100.0 fL   MCH 29.0 26.0 - 34.0 pg   MCHC 32.8 30.0 - 36.0 g/dL   RDW 15.0 11.5 - 15.5 %   Platelets 170 150 - 400 K/uL  Type and screen   Collection Time: 06/17/18  4:21 AM  Result Value Ref Range   ABO/RH(D) O POS    Antibody Screen NEG    Sample Expiration      06/20/2018 Performed at [facility], [address] [place], [place] [address]   POCT fern test   Collection Time: 06/17/18  4:59 AM  Result Value  Ref Range   POCT Fern Test Positive = ruptured amniotic membanes     Patient Active Problem List   Diagnosis Date Noted  . Post term pregnancy 06/17/2018  . Normal labor 06/17/2018  . GBS (group B streptococcus) UTI complicating pregnancy 19/14/7829  . Anxiety and depression 02/01/2018  . Neck pain, musculoskeletal 12/08/2017  . Supervision of low-risk pregnancy, third trimester 10/08/2017  . Migraine 11/16/2015  . Depression, Moderate 05/13/2015  . Stress induced anxiety 05/04/2015  . ASTHMA, INTERMITTENT 11/08/2006    Assessment:  Alejandra Conway is a 32 y.o. [id number] at [redacted]w[redacted]d here for SROM.   #Labor: Expectant management at this time due to cervical change over 2 hours, possible augmentation with pitocin if progression slows.  #Pain: Plans natural delivery  #FWB: Cat I #ID:  GBS positive- PCN  #MOF: Breast #MOC: Vasectomy  #Circ:  Yes, outpatient   Lajean Manes, CNM 06/17/2018, 6:15 AM

## 2018-06-17 NOTE — Progress Notes (Signed)
Patient ID: Alejandra Conway, female   DOB: 02-Dec-1985, 32 y.o.   MRN: [id number] Not hurting much  Vitals:   06/17/18 0613 06/17/18 0615 06/17/18 0718 06/17/18 0953  BP:  (!) 112/59 (!) 109/59 (!) 101/56  Pulse:  78 90 76  Resp: 16 16 16 16   Temp: 98 F (36.7 C)  98.2 F (36.8 C) 97.7 F (36.5 C)  TempSrc: Oral  Oral Oral  Weight: 80.3 kg     Height: 5\' 2"  (5.189 m)      Declines augmentation or analgesia  Dilation: 3 Effacement (%): 80 Station: -2 Presentation: Vertex Exam by:: Darrol Poke, CNM  Cervix exam deferred  Will get her up to walk after she naps

## 2018-06-17 NOTE — MAU Note (Signed)
Pt. States that she ruptured at [facility] with clear fluids. Reports ctx began at 0218 and is ctx every 10 min. Denies bleeding. +FM

## 2018-06-18 NOTE — Progress Notes (Signed)
MOB was referred for history of depression/anxiety. * Referral screened out by Clinical Social Worker because none of the following criteria appear to apply: ~ History of anxiety/depression during this pregnancy, or of post-partum depression following prior delivery. ~ Diagnosis of anxiety and/or depression within last 3 years OR * MOB's symptoms currently being treated with medication and/or therapy. Please contact the Clinical Social Worker if needs arise, by MOB request, or if MOB scores greater than 9/yes to question 10 on [place] Postpartum Depression Screen.  Alejandra Hannig, LCSW Clinical Social Worker  [facility] [employer]  [phone]  

## 2018-06-18 NOTE — Progress Notes (Signed)
Post Partum Day 1 Subjective: No complaints, up ad lib, voiding, tolerating PO and + flatus  Baby doing well at bedside, breastfeeding.  Objective: Blood pressure (!) 92/55, pulse 70, temperature 98.1 F (36.7 C), temperature source Oral, resp. rate 16, height 5\' 2"  (1.575 m), weight 80.3 kg, last menstrual period 08/21/2017, unknown if currently breastfeeding.  Physical Exam:  General: alert and no distress Lochia: appropriate Uterine Fundus: firm DVT Evaluation: No evidence of DVT seen on physical exam. Negative Homan's sign. No cords or calf tenderness.  Recent Labs    06/17/18 0421  HGB 11.7*  HCT 35.7*    Assessment/Plan: Continue Celexa Breastfeeding and Contraception vasectomy (already done in July) Routine postpartum care Plan for discharge tomorrow,    LOS: 1 day   Verita Schneiders, MD 06/18/2018, 12:46 PM

## 2018-06-18 NOTE — Lactation Note (Addendum)
This note was copied from a baby's chart. Lactation Consultation Note  Patient Name: Alejandra Conway Today's Date: 06/18/2018   g1 p1 vag delivery. Mom with hx of anxiety and depression. Mom attempting to feed baby on her hard side she reports.  Mom with reddened nipple and compression strip on left.  Did not observe right.  Mom reports feeding painful. Asking mom if I could show her another feeding position.  Showed mom laid back bf.  Mom reports more comfort at first during the feed.  Moved infant around some to get pillows under her arms to help her be more comfortable. Mom reports it started to be painful.  Latch still looked good.  But instructed mom to take him off to reposition him.  During this time pediaitriricin came in room  needing to assess infant.  Urged mom to keep working with bf on that side.  Really work with getting nipple pointed up to roof of mouth and a good big mouthful. Urged hand express rub ebm on nipples air dry and then coconut oil.   Urged mom to call back for feeding assistance.  Did not go over handouts with mom or booklet at this time. Maternal Data  oreeding Feeding Type: (mom about to feed; offerred assist; mom declined)  LATCH Score Latch: (enc mother to call for latch)                 Interventions    Lactation Tools Discussed/Used     Consult Status      Rollen Sox 06/18/2018, 10:15 AM

## 2018-06-18 NOTE — Plan of Care (Signed)
  Problem: Role Relationship: Goal: Ability to demonstrate positive interaction with newborn will improve Note:  Lactation and pediatrician was in room when this RN arrived to do baby's assessment. Mother states she was about to feed baby. Lactation left during pediatrician assessment. Offered mother assistance with feeding; however, once I handed baby to mother, she held baby on chest and did not prepare to feed baby. Mother stated that she will attempt to feed baby using lactation's suggestions and does not desire this RN's assistance at this time. Encouraged mother to call if she needs assistance and to call once baby latches in order for RN or LC to observe latch. Maxwell Caul, Leretha Dykes [place]

## 2018-06-19 MED ORDER — IBUPROFEN 600 MG PO TABS: 600.0000 mg | ORAL_TABLET | Freq: Three times a day (TID) | ORAL | 0 refills | Status: DC | PRN

## 2018-06-19 NOTE — Discharge Summary (Signed)
Obstetrics Discharge Summary OB/GYN Faculty Practice   Patient Name: Alejandra Conway DOB: 24-Mar-1986 MRN: [id number]  Date of admission: 06/17/2018 Delivering MD: Seabron Spates   Date of discharge: 06/19/2018  Admitting diagnosis: 40WKS WATER BROKE Intrauterine pregnancy: [redacted]w[redacted]d     Secondary diagnosis:   Active Problems:   Post term pregnancy   Normal labor   Postpartum care following vaginal delivery  Additional problems:  . Anxiety . Intermittent asthma . Moderation depression . History of migraines . History of GBS UTI in pregnancy     Discharge diagnosis: Term Pregnancy Delivered                                            Postpartum procedures: None  Complications: Edinburgh 11 with history of suicidal thoughts - seen by social work, see note for details  given increased risk for postpartum depression will be seen in clinic within the next 1-2 weeks   Hospital course: Alejandra Conway is a 32 y.o. [redacted]w[redacted]d who was admitted for SROM, contractions. Her pregnancy was complicated by the above noted items. Her labor course was notable for pitocin augmentation. Delivery was complicated by 1st degree midline perineal laceration. Please see delivery/op note for additional details. Her postpartum course was uncomplicated. She was breastfeeding without difficulty. By day of discharge, she was passing flatus, urinating, eating and drinking without difficulty. Her pain was well-controlled, and she was discharged home with ibuprofen. She will follow-up in clinic in 1-2 weeks for a mood check and in 4-6 weeks for routine postpartum visit.   Physical exam  Vitals:   06/18/18 1535 06/18/18 2254 06/19/18 0603 06/19/18 1502  BP: 112/68 114/65 109/64 118/65  Pulse: 89 87 61 84  Resp: 18 18 18 16   Temp: 97.9 F (36.6 C) 98.1 F (36.7 C) 97.8 F (36.6 C) 98.3 F (36.8 C)  TempSrc: Oral Oral Oral Oral  SpO2: 98%   100%  Weight:      Height:       General: quiet, well-appearing,  eating lunch and holding infant Lochia: appropriate Uterine Fundus: firm Incision: N/A DVT Evaluation: No significant calf/ankle edema. Labs: Lab Results  Component Value Date   WBC 8.8 06/17/2018   HGB 11.7 (L) 06/17/2018   HCT 35.7 (L) 06/17/2018   MCV 88.6 06/17/2018   PLT 170 06/17/2018   CMP Latest Ref Rng & Units 11/18/2013  Glucose 70 - 99 mg/dL 99  BUN 6 - 23 mg/dL 10  Creatinine 0.50 - 1.10 mg/dL 0.57  Sodium 137 - 147 mEq/L 141  Potassium 3.7 - 5.3 mEq/L 3.6(L)  Chloride 96 - 112 mEq/L 105  CO2 19 - 32 mEq/L 23  Calcium 8.4 - 10.5 mg/dL 8.9  Total Protein 6.0 - 8.3 g/dL 6.6  Total Bilirubin 0.3 - 1.2 mg/dL <0.2(L)  Alkaline Phos 39 - 117 U/L 51  AST 0 - 37 U/L 13  ALT 0 - 35 U/L 9    Discharge instructions: Per After Visit Summary and "Baby and Me Booklet"  After visit meds:  Allergies as of 06/19/2018      Reactions   Pseudoephedrine Shortness Of Breath      Medication List    STOP taking these medications   albuterol 108 (90 Base) MCG/ACT inhaler Commonly known as:  PROVENTIL HFA;VENTOLIN HFA     TAKE these medications   citalopram 40 MG tablet Commonly  known as:  CELEXA Take 1 tablet (40 mg total) by mouth daily.   ibuprofen 600 MG tablet Commonly known as:  ADVIL,MOTRIN Take 1 tablet (600 mg total) by mouth every 8 (eight) hours as needed for cramping (pain).   ONE A DAY PRENATAL PO Take 1 capsule by mouth daily.       Postpartum contraception: Vasectomy Diet: Routine Diet Activity: Advance as tolerated. Pelvic rest for 6 weeks.   Outpatient follow up:4-6 weeks with OB provider, 1-2 weeks with behavioral health in clinic Follow-up Appt: Future Appointments  Date Time Provider [place]  07/16/2018  9:00 AM Anyanwu, Sallyanne Havers, MD [facility]   Newborn Data: Live born female  Birth Weight: 7 lb 5.5 oz (3330 g) APGAR: 8, 9  Newborn Delivery   Birth date/time:  06/17/2018 17:15:00 Delivery type:  Vaginal, Spontaneous     Baby Feeding: Breast Disposition:to be determined - mother may be rooming in overnight depending on infant jaundice but then to be discharged home with mother  Lambert Mody. Juleen China, DO OB/GYN Fellow, Faculty Practice

## 2018-06-19 NOTE — Lactation Note (Addendum)
This note was copied from a baby's chart. Lactation Consultation Note  Patient Name: Alejandra Conway Today's Date: 06/19/2018 Reason for consult: Follow-up assessment   Baby 31 hours old.  3 voids and 2 stools within the last 24 hours. Baby was latched upon entering with swallows. Noted bruise on areola of L breast.   Mother states this baby is breastfeeding better than her other children but feeds frequently. Bilirubin has increased but phototherapy has not been indicated yet.  DEBP in room and mother has pumped once.   Praised mother for her efforts of feeding frequently and encouraged her to continue post pumping. Give baby back volume pumped.  Mother alone in room and tired.  She states FOB will arrive later tonight.  Did not get to access suck due to feeding.  LC will follow up.     Maternal Data    Feeding Feeding Type: Breast Fed  LATCH Score Latch: Grasps breast easily, tongue down, lips flanged, rhythmical sucking.(latched upon entering)  Audible Swallowing: A few with stimulation  Type of Nipple: Flat  Comfort (Breast/Nipple): Soft / non-tender  Hold (Positioning): No assistance needed to correctly position infant at breast.  LATCH Score: 8  Interventions    Lactation Tools Discussed/Used Initiated by:: RN Date initiated:: 06/19/18   Consult Status      Vivianne Master Boschen 06/19/2018, 3:24 PM

## 2018-06-19 NOTE — Lactation Note (Signed)
This note was copied from a baby's chart. Lactation Consultation Note  Patient Name: Alejandra Conway Today's Date: 06/19/2018   P3, Baby 64 hours old. Increased bilirubin.  Mother has started pumping in addition to breastfeeding. Encouraged post pumping q 3 hours giving back volume.  Reviewed syringe feeding. Mother declined needing assistance w/ latching. Suggest mother breastfeed on both breasts per session with feeding cues.      Maternal Data    Feeding Feeding Type: Breast Fed  LATCH Score                   Interventions    Lactation Tools Discussed/Used     Consult Status      Carlye Grippe 06/19/2018, 1:40 PM

## 2018-06-19 NOTE — Progress Notes (Signed)
CSW received consult due to score 11 on Edinburgh Depression Screen.    CSW met with MOB privately via bedside to provide any supports needed. MOB was pleasant during conversation however seemed withdrawn. MOB was open regarding her history with anxiety- stating she is currently on Celexa which has been working well for her. MOB states she was previously on a different medication (unsure what) and switched to Celexa. CSW questioned if MOB has felt homicidal/suicidal in the past 7 days (answered "hardly ever" on depression screen)- MOB stated not currently but did have some thoughts before going into labor. CSW questioned if MOB had a plan- MOB stated multiple times she would never act on her thoughts. CSW questioned if MOB was currently have suicidal/ homicidal thoughts- MOB stated no. CSW questioned MOB supports and people she feels comfortable talking with- MOB states feels comfortable speaking with her mom openly and spouse, however does not think her spouse understands. MOB states she has a good relationship with her OB and is able to speak with her regarding her feelings/ concerns.   CSW provided education regarding Baby Blues vs PMADs and provided MOB with resources for mental health follow up.  CSW encouraged MOB to evaluate her mental health throughout the postpartum period with the use of the New Mom Checklist developed by Postpartum Progress as well as the Edinburgh Postnatal Depression Scale and notify a medical professional if symptoms arise.    CSW notified RN and MD of MOD being withdrawn- will schedule follow up appointment for 1 week after discharge if available.   Cindia Hustead, LCSW Clinical Social Worker  [employer]  [phone]  

## 2018-06-20 ENCOUNTER — Encounter: Payer: Self-pay | Admitting: Obstetrics and Gynecology

## 2018-06-20 ENCOUNTER — Ambulatory Visit: Payer: Self-pay

## 2018-06-20 NOTE — Lactation Note (Signed)
This note was copied from a baby's chart. Lactation Consultation Note Baby 55 hrs. Baby w/high pitch cry, aggressive on the breast. Acts as if he's starving. Feeds for short time then falls asleep. Will not sleep more than 10-15 min.  Mom had been supplementing d/t baby not acting satisfied and out put decreased. Mom states her nipples are hurting so bad. Mom stated baby has been on the breast almost constant. Discussed cluster feeding and purpose.  Mom removed shells. Noted Rt. Nipple short shaft, red. Lt. Nipple very short shaft w/cracked center and multiple bruises on that areola. Mom using coconut oil. Gave mom comfort gels. Instructed mom to removed coconut oil before applying CG.  Noted when baby crying, tongue w/limited movement. Has mid anterior tight frenulum as well as labial frenulum. Noted when baby on the breast his head bobs up and down as if chomping.  Fitted mom w/#20 NS. A little transfer noted. Fitted #24 NS, noted more transfer w/gulping at the breast. Breast so full, little colostrum pumping out. Encouraged mom to lay flat w/ICE on breast rotating around to lessen edema to breast. Then mom needs to feed again if necessary and or BF. encouraged mom to massage breast during feeding. Encouraged mom to pump when breast filling full and baby didn't relieve engorgement. Discussed engorgement, filling, breast massage, milk storage, and management.  Encouraged to call for questions or concerns. Suggested mom to inquire MD regarding tongue tie.  Patient Name: Boy [name] Perfecto Today's Date: 06/20/2018 Reason for consult: Follow-up assessment;Nipple pain/trauma   Maternal Data Has patient been taught Hand Expression?: Yes Does the patient have breastfeeding experience prior to this delivery?: Yes  Feeding Feeding Type: Breast Milk  LATCH Score Latch: Grasps breast easily, tongue down, lips flanged, rhythmical sucking.  Audible Swallowing: Spontaneous and  intermittent  Type of Nipple: Everted at rest and after stimulation(short shaft)  Comfort (Breast/Nipple): Engorged, cracked, bleeding, large blisters, severe discomfort  Hold (Positioning): Assistance needed to correctly position infant at breast and maintain latch.  LATCH Score: 7  Interventions Interventions: Breast feeding basics reviewed;Adjust position;DEBP;Assisted with latch;Support pillows;Ice;Skin to skin;Position options;Breast massage;Expressed milk;Hand express;Coconut oil;Pre-pump if needed;Shells;Reverse pressure;Comfort gels;Breast compression;Hand pump  Lactation Tools Discussed/Used Tools: Shells;Pump;Coconut oil;Comfort gels;Nipple Shields Nipple shield size: 24 Shell Type: Inverted Breast pump type: Double-Electric Breast Pump;Manual WIC Program: No   Consult Status Consult Status: Follow-up Date: 06/20/18 Follow-up type: In-patient    Nicolus Ose, Elta Gu[place] 06/20/2018, 2:06 AM

## 2018-06-20 NOTE — Lactation Note (Signed)
This note was copied from a baby's chart. Lactation Consultation Note  Patient Name: Boy [name] Today's Date: 06/20/2018   P3, Baby 51 hours old.   Mother's nipples are tender, pink.  L breast bruise has improved.  Breasts are full. Mother supplemented with formula through the night and did pump approx 4 ml this morning from one breast. Baby cueing.  Noted limited tongue protrusion.  MD aware. Observed breastfeeding with #24NS.  Baby alternates between sucking and chomping breaking, intermittently not maintain seal. Breastmilk in NS after feeding. Recommend mother post pump 4-6 times per day for 10-20 min with DEBP. Give baby back volume pumped at the next feeding and additional formula as baby desires. Reviewed engorgement care and monitoring voids/stools. Suggest OP appt.            Maternal Data    Feeding Feeding Type: Breast Fed  LATCH Score                   Interventions    Lactation Tools Discussed/Used     Consult Status      Carlye Grippe 06/20/2018, 8:09 AM

## 2018-06-23 ENCOUNTER — Inpatient Hospital Stay (HOSPITAL_COMMUNITY): Payer: Self-pay

## 2018-06-23 ENCOUNTER — Inpatient Hospital Stay (HOSPITAL_COMMUNITY): Admission: RE | Admit: 2018-06-23 | Payer: Self-pay | Source: Ambulatory Visit

## 2018-06-27 ENCOUNTER — Institutional Professional Consult (permissible substitution): Payer: Self-pay

## 2018-06-27 NOTE — BH Specialist Note (Deleted)
Integrated Behavioral Health Initial Visit  MRN: [id number] Name: Eritrea Appenzeller  Number of [place] Clinician visits:: 1/6 Session Start time: ***  Session End time: *** Total time: {IBH Total Time:21014050}  Type of Service: [place] Interpretor:No. Interpretor Name and Language: n/a   Warm Hand Off Completed.       SUBJECTIVE: Eritrea Wilhide is a 32 y.o. female accompanied by {CHL AMB ACCOMPANIED MN:8177116579} Patient was referred by *** for history of anxiety and depression. Patient reports the following symptoms/concerns: *** Duration of problem: ***; Severity of problem: {Mild/Moderate/Severe:20260}  OBJECTIVE: Mood: {BHH MOOD:22306} and Affect: {BHH AFFECT:22307} Risk of harm to self or others: {CHL AMB BH Suicide Current Mental Status:21022748}  LIFE CONTEXT: Family and Social: *** School/Work: *** Self-Care: *** Life Changes: Recent childbirth ***  GOALS ADDRESSED: Patient will: 1. Reduce symptoms of: {IBH Symptoms:21014056} 2. Increase knowledge and/or ability of: {IBH Patient Tools:21014057}  3. Demonstrate ability to: {IBH Goals:21014053}  INTERVENTIONS: Interventions utilized: {IBH Interventions:21014054}  Standardized Assessments completed: {IBH Screening Tools:21014051}  ASSESSMENT: Patient currently experiencing ***.   Patient may benefit from ***.  PLAN: 1. Follow up with behavioral health clinician on : *** 2. Behavioral recommendations: *** 3. Referral(s): {IBH Referrals:21014055} 4. "From scale of 1-10, how likely are you to follow plan?": ***  Caroleen Hamman McMannes, LCSW

## 2018-07-16 ENCOUNTER — Ambulatory Visit: Payer: Self-pay | Admitting: Obstetrics & Gynecology

## 2018-07-16 NOTE — Progress Notes (Deleted)
   Patient did not show up today for her scheduled postpartum appointment.   Verita Schneiders, MD, [place] for [employer], [place]

## 2018-07-24 ENCOUNTER — Ambulatory Visit: Payer: Self-pay | Admitting: Advanced Practice Midwife

## 2018-07-24 NOTE — Progress Notes (Deleted)
Post Partum Exam  Alejandra Conway is a 32 y.o. [phone] female who presents for a postpartum visit. She is {1-10:13787} {time; units:18646} postpartum following a vaginal delivery.  I have fully reviewed the prenatal and intrapartum course. The delivery was at 40 gestational weeks.  Anesthesia: {anesthesia types:812}. Postpartum course has been uncomplicated. Baby's course has been uncomplicated. Baby is feeding by breast. Bleeding {vag bleed:12292}. Bowel function is normal. Bladder function is normal. Patient  sexually active. Contraception method is {contraceptive method:5051}. Postpartum depression screening:neg  {Common ambulatory SmartLinks:19316} Last pap smear done *** and was {Desc; normal/abnormal:11317::"Normal"}  Review of Systems {ros; complete:30496}    Objective:  unknown if currently breastfeeding.  General:  {gen appearance:16600}   Breasts:  {breast exam:1202::"inspection negative, no nipple discharge or bleeding, no masses or nodularity palpable"}  Lungs: {lung exam:16931}  Heart:  {heart exam:5510}  Abdomen: {abdomen exam:16834}   Vulva:  {labia exam:12198}  Vagina: {vagina exam:12200}  Cervix:  {cervix exam:14595}  Corpus: {uterus exam:12215}  Adnexa:  {adnexa exam:12223}  Rectal Exam: {rectal/vaginal exam:12274}        Assessment:    *** postpartum exam. Pap smear {done:10129} at today's visit.   Plan:   1. Contraception: {method:5051} 2. *** 3. Follow up in: {1-10:13787} {time; units:19136} or as needed.

## 2018-07-29 ENCOUNTER — Ambulatory Visit (INDEPENDENT_AMBULATORY_CARE_PROVIDER_SITE_OTHER): Payer: Medicaid Other | Admitting: Nurse Practitioner

## 2018-07-29 ENCOUNTER — Encounter: Payer: Self-pay | Admitting: Nurse Practitioner

## 2018-07-29 DIAGNOSIS — Z6828 Body mass index (BMI) 28.0-28.9, adult: Secondary | ICD-10-CM | POA: Insufficient documentation

## 2018-07-29 DIAGNOSIS — Z1389 Encounter for screening for other disorder: Secondary | ICD-10-CM

## 2018-07-29 NOTE — Progress Notes (Signed)
Post Partum Exam  Alejandra Conway is a 32 y.o. [phone] female who presents for a postpartum visit. She is five weeks postpartum following a spontaneous vaginal delivery. I have fully reviewed the prenatal and intrapartum course. The delivery was at 40 gestational weeks.  Anesthesia: nothing. Postpartum course has been uncomplicated. Baby's course has been uncomplicated. Baby is feeding by breast. Bleeding not at this time. Bowel function is normal. Bladder function is normal. Patient is not sexually active. Contraception method is nothing at this time. Husband had vasectomy several weeks ago. Postpartum depression screening:negative  Has baby and 2 older children with her today.  The following portions of the patient's history were reviewed and updated as appropriate: allergies, current medications, past family history, past medical history, past social history, past surgical history and problem list. Last pap smear done 10/22/2017 and was normal  Review of Systems Pertinent items noted in HPI and remainder of comprehensive ROS otherwise negative.    Objective:    General:  alert, cooperative and no distress   Breasts:  deferred  Lungs: clear to auscultation bilaterally  Heart:  regular rate and rhythm, S1, S2 normal, no murmur, click, rub or gallop  Abdomen: not examined   Vulva:  not evaluated  Vagina: not evaluated  Cervix:  not examined  Corpus: not examined  Adnexa:  not evaluated  Rectal Exam: Not performed.        Assessment:    Normal postpartum exam. Pap smear not done at today's visit.  Due in Feb. 2022 No depression or anxiety symptoms today.    Plan:   1. Contraception: vasectomy  Advised to have a follow up visit to check sperm count.  Procedure done in [place].  Will be there in December. 2. Starting to exercise - encouraged to start slowly and progress as her body tolerates it - still wants to lose 20 pounds.   3. Follow up in: 12 months or as needed.  4.  See  AVS also.  Earlie Server, RN, MSN, NP-BC Nurse Practitioner, [facility] for [employer], [place] 07/29/2018 2:02 PM

## 2018-11-01 ENCOUNTER — Ambulatory Visit: Payer: Medicaid Other | Admitting: Family Medicine

## 2018-11-01 ENCOUNTER — Encounter: Payer: Self-pay | Admitting: Family Medicine

## 2018-11-01 VITALS — BP 100/80 | HR 62 | Temp 97.8°F | Wt 143.8 lb

## 2018-11-01 DIAGNOSIS — Z9189 Other specified personal risk factors, not elsewhere classified: Secondary | ICD-10-CM | POA: Diagnosis not present

## 2018-11-01 DIAGNOSIS — N926 Irregular menstruation, unspecified: Secondary | ICD-10-CM | POA: Diagnosis not present

## 2018-11-01 DIAGNOSIS — F418 Other specified anxiety disorders: Secondary | ICD-10-CM | POA: Diagnosis not present

## 2018-11-01 DIAGNOSIS — R42 Dizziness and giddiness: Secondary | ICD-10-CM

## 2018-11-01 LAB — POCT URINE PREGNANCY: PREG TEST UR: NEGATIVE

## 2018-11-01 NOTE — Progress Notes (Signed)
  Patient Name: Alejandra Conway Date of Birth: 1986/05/31 Date of Visit: 11/01/18 PCP: Martyn Malay, MD  Chief Complaint: buzzing in head   Subjective: Alejandra Conway is a pleasant 33 y.o. woman with history significant for moderate depression, migraines, and postpartum status (October 2019) presenting with fatigue, dizziness, and feelings of word confusion at time.  The patient reports she has had several months of fatigue, dizziness upon standing, sensation of buzzing, and intermittent headaches. She works from home with her husband (see below) and has three children. She reports she is eating and drinking well. She reports her mood is low. She denies SI/HI. She reports irritability, fatigue, changes in appetite, and poor sleep. She wakes up every 2-3 hours to feed. She denies nausea, vomiting, weight loss, severe headaches, changes in headaches, commanding thoughts, or seeing/hearing things others cannot. She is taking Citalopram 2-3 times per week. She finds she forgets to take this medication.  The patient has a history of depression, previously followed by Dr. Tammi Klippel and Dr. Gwenlyn Saran. She lives with her husband and three children. She finds her relationship with her husband stressful. She previously left him for a period of 6 months. Currently, she works for his business Teacher, early years/pre). She does not find the relationship fulfilling.    Alejandra is breastfeeding only---every 2-3 hours. She reports Duanne Limerick (youngest) will not take a bottle. She has tried over 10 different nipples. She has pumped milk. Her mother sometimes helps her with her children.     ROS: Negative except for as above. Denies SI/HI or cutting behaviors.  ROS  I have reviewed the patient's medical, surgical, family, and social history as appropriate.   Vitals:   11/01/18 1030  BP: 100/80  Pulse: 62  Temp: 97.8 F (36.6 C)  SpO2: 99%   Filed Weights   11/01/18 1030  Weight: 143 lb 12.8 oz (65.2 kg)      HEENT: Sclera anicteric. Dentition is moderate. Appears well hydrated. Neck: Supple Cardiac: Regular rate and rhythm. Normal S1/S2. No murmurs, rubs, or gallops appreciated. Lungs: Clear bilaterally to ascultation.  Abdomen: Normoactive bowel sounds. No tenderness to deep or light palpation. No rebound or guarding.  Extremities: Warm, well perfused without edema.  Skin: Warm, dry Psych: Pleasant and appropriate   Neuro: CN II: PERRL CN III, IV,VI: EOMI CV V: Normal sensation in V1, V2, V3 CVII: Symmetric smile and brow raise CN VIII: Normal hearing CN IX,X: Symmetric palate raise  CN XI: 5/5 shoulder shrug CN XII: Symmetric tongue protrusion  UE and LE strength 5/5 2+ UE and LE reflexes  Normal sensation in UE and LE bilaterally  No ataxia with finger to nose, normal heel to shin  Negative [place] presents with a variety of symptoms, most consistent with worsening of her baseline depression and possibly withdrawal (intermittently) from Citalopram. No SI/HI. No signs of psychosis. Differential also includes anemia, migraine with vertiginous features, thyroid disease, electrolyte abnormality. Her stressors include her 64 old, who is only breastfeeding and will not take a bottle. Will refer to Lactation, provide information about paced feeding, transitioning to bottle.   Dizziness on standing -     CBC -     Basic Metabolic Panel -     TSH Missed menses -     POCT urine pregnancy Postpartum state -     Ambulatory referral to Lactation  Dorris Singh, MD  Family Medicine Teaching Service

## 2018-11-01 NOTE — Patient Instructions (Addendum)
I will call you with the results of your blood work  Set a timer to take Celexa every day  Call a psychologist---Dr. Tod Persia number  [facility] Address: [place], [place], [place] [address] Phone: [phone]  Counseling    Therapists  [facility]  [address] [place], [place] [phone]  [facility]  [address] [place] [address] [place], [place] [phone]  [facility]  [address], [place]  [place], [place] [address] [place], [place] [address]  [phone]  [facility]  [address] [place], [place] [address] [place], [place] [address]  [phone]

## 2018-11-02 LAB — CBC
HEMATOCRIT: 39 % (ref 34.0–46.6)
HEMOGLOBIN: 13.7 g/dL (ref 11.1–15.9)
MCH: 29.8 pg (ref 26.6–33.0)
MCHC: 35.1 g/dL (ref 31.5–35.7)
MCV: 85 fL (ref 79–97)
Platelets: 191 10*3/uL (ref 150–450)
RBC: 4.6 x10E6/uL (ref 3.77–5.28)
RDW: 14.5 % (ref 11.7–15.4)
WBC: 5.5 10*3/uL (ref 3.4–10.8)

## 2018-11-02 LAB — BASIC METABOLIC PANEL
BUN/Creatinine Ratio: 19 (ref 9–23)
BUN: 15 mg/dL (ref 6–20)
CALCIUM: 10 mg/dL (ref 8.7–10.2)
CHLORIDE: 104 mmol/L (ref 96–106)
CO2: 24 mmol/L (ref 20–29)
CREATININE: 0.77 mg/dL (ref 0.57–1.00)
GFR calc Af Amer: 120 mL/min/{1.73_m2} (ref >59)
GFR, EST NON AFRICAN AMERICAN: 104 mL/min/{1.73_m2} (ref >59)
Glucose: 84 mg/dL (ref 65–99)
Potassium: 4.2 mmol/L (ref 3.5–5.2)
Sodium: 142 mmol/L (ref 134–144)

## 2018-11-02 LAB — TSH: TSH: 1.23 u[IU]/mL (ref 0.450–4.500)

## 2018-11-13 ENCOUNTER — Telehealth: Payer: Self-pay | Admitting: Family Medicine

## 2018-11-13 NOTE — Telephone Encounter (Signed)
Called patient to check in on symptoms and status of calling Psychology. She will reach out to Psychology before Friday. Discussed lactation referral with Kennyth Lose.   Dorris Singh, MD  Family Medicine Teaching Service

## 2018-11-15 ENCOUNTER — Ambulatory Visit (INDEPENDENT_AMBULATORY_CARE_PROVIDER_SITE_OTHER): Payer: Self-pay | Admitting: Family Medicine

## 2018-11-15 ENCOUNTER — Encounter: Payer: Self-pay | Admitting: Family Medicine

## 2018-11-15 VITALS — BP 100/70 | HR 74 | Temp 97.8°F | Wt 143.4 lb

## 2018-11-15 DIAGNOSIS — E663 Overweight: Secondary | ICD-10-CM

## 2018-11-15 DIAGNOSIS — G43709 Chronic migraine without aura, not intractable, without status migrainosus: Secondary | ICD-10-CM

## 2018-11-15 HISTORY — DX: Overweight: E66.3

## 2018-11-15 MED ORDER — RIBOFLAVIN 400 MG PO TABS: ORAL_TABLET | ORAL | 3 refills | Status: DC

## 2018-11-15 MED ORDER — RIZATRIPTAN BENZOATE 5 MG PO TABS: 5.0000 mg | ORAL_TABLET | ORAL | 0 refills | Status: DC | PRN

## 2018-11-15 MED ORDER — CITALOPRAM HYDROBROMIDE 40 MG PO TABS: 40.0000 mg | ORAL_TABLET | Freq: Every day | ORAL | 1 refills | Status: DC

## 2018-11-15 NOTE — Patient Instructions (Signed)
Schedule follow up in 3 weeks with me  It was wonderful to see you today.  Thank you for choosing [facility].   Please call [phone] with any questions about today's appointment.  Please be sure to schedule follow up at the front  desk before you leave today.   Dorris Singh, MD  Family Medicine   Go to [facility] on your way home for your medications  Call lactation at [phone]

## 2018-11-15 NOTE — Progress Notes (Signed)
  Patient Name: Alejandra Conway Date of Birth: 12-Aug-1986 Date of Visit: 11/15/18 PCP: Martyn Malay, MD  Chief Complaint: follow up anxiety, dizziness   Subjective: Alejandra Lavergne is a pleasant 33 y.o. with medical history significant for anxiety presenting today for check up.   Headaches Patient reports a many year history of migraine headaches.  She reports these are usually unilateral to start and then progressed to bilateral throbbing headaches.  They last between 4 to 12 hours.  The headaches are throbbing in nature and 10 at worst usually down to a 4 out of 10 when she takes Excedrin.  She previously used sumatriptan for these but developed total body weakness from the sumatriptan.  She describes this as feeling somewhat out of her body and unable to control her limbs at that time.  She denies seizures.  She endorses associated photophobia phonophobia and nausea with these headaches no vomiting with the headaches.  She is amenable to trying alternative triptan.  She previously tried Topamax for the symptoms and developed several side effects.  Anxiety The patient reports she is taking her citalopram between 5 and 6 days/week now.  She was previously taking it 1 to 2 days a week.  She denies thoughts of hurting herself or others.  She still has gotten into several arguments with her husband over the past week.  She feels their relationship is strained. She has not yet called a therapist.   ROS: Negative for sensation of electric shocks, chest pain, difficulty breathing. ROS  I have reviewed the patient's medical, surgical, family, and social history as appropriate.   Vitals:   11/15/18 0852  BP: 100/70  Pulse: 74  Temp: 97.8 F (36.6 C)  SpO2: 96%   Filed Weights   11/15/18 0852  Weight: 143 lb 6.4 oz (65 kg)   HEENT: Sclera anicteric. Dentition is moderate. Appears well hydrated. Neck: Supple Cardiac: Regular rate and rhythm. Normal S1/S2. No murmurs, rubs, or  gallops appreciated. Lungs: Clear bilaterally to ascultation.  Extremities: Warm, well perfused without edema.  Skin: Warm, dry Psych: Pleasant and appropriate   Neuro: CN II: PERRL CN III, IV,VI: EOMI CV V: Normal sensation in V1, V2, V3 CVII: Symmetric smile and brow raise CN VIII: Normal hearing CN IX,X: Symmetric palate raise  CN XI: 5/5 shoulder shrug CN XII: Symmetric tongue protrusion  UE and LE strength 5/5 2+ UE and LE reflexes  Normal sensation in UE and LE bilaterally  No ataxia with finger to nose, normal heel to shin  Negative [place]    Diagnoses and all orders for this visit:  Chronic migraine without aura without status migrainosus, not intractable -     rizatriptan (MAXALT) 5 MG tablet; Take 1 tablet (5 mg total) by mouth as needed for migraine. May repeat in 2 hours if needed -     Riboflavin 400 MG TABS; Take 1 tablet daily by mouth  Generalized Anxiety-GAD 7 today was improved from prior.  The patient called [facility] today.  They did not take Medicaid.  The patient is to reapply for Medicaid.  I will give him to local therapist to take her Medicaid insurance.  She is to follow-up in 3 weeks time to discuss symptoms further. -     citalopram (CELEXA) 40 MG tablet; Take 1 tablet (40 mg total) by mouth daily.    Dorris Singh, MD  Family Medicine Teaching Service

## 2018-12-02 ENCOUNTER — Telehealth: Payer: Self-pay | Admitting: Family Medicine

## 2018-12-02 NOTE — Telephone Encounter (Signed)
Attempted to call patient to reschedule appointments.

## 2018-12-06 ENCOUNTER — Ambulatory Visit: Payer: Self-pay | Admitting: Family Medicine

## 2019-06-25 ENCOUNTER — Encounter: Payer: Self-pay | Admitting: Radiology

## 2020-01-26 ENCOUNTER — Ambulatory Visit: Payer: Medicaid Other | Admitting: Family Medicine

## 2020-02-02 ENCOUNTER — Other Ambulatory Visit: Payer: Self-pay

## 2020-02-02 ENCOUNTER — Encounter: Payer: Self-pay | Admitting: Family Medicine

## 2020-02-02 ENCOUNTER — Ambulatory Visit (INDEPENDENT_AMBULATORY_CARE_PROVIDER_SITE_OTHER): Payer: Medicaid Other | Admitting: Family Medicine

## 2020-02-02 VITALS — BP 110/65 | HR 63 | Ht 62.0 in | Wt 142.4 lb

## 2020-02-02 DIAGNOSIS — F321 Major depressive disorder, single episode, moderate: Secondary | ICD-10-CM

## 2020-02-02 DIAGNOSIS — J45990 Exercise induced bronchospasm: Secondary | ICD-10-CM | POA: Diagnosis not present

## 2020-02-02 MED ORDER — ALBUTEROL SULFATE HFA 108 (90 BASE) MCG/ACT IN AERS: 2.0000 | INHALATION_SPRAY | Freq: Four times a day (QID) | RESPIRATORY_TRACT | 3 refills | Status: DC | PRN

## 2020-02-02 MED ORDER — CITALOPRAM HYDROBROMIDE 20 MG PO TABS: 20.0000 mg | ORAL_TABLET | Freq: Every day | ORAL | 3 refills | Status: DC

## 2020-02-02 NOTE — Assessment & Plan Note (Signed)
Albuterol as needed prior to exercise.  Discussed return precautions.

## 2020-02-02 NOTE — Assessment & Plan Note (Signed)
Refilled inhaler albuterol, discussed how to take prior to exercise.  Discussed return precautions.

## 2020-02-02 NOTE — Patient Instructions (Signed)
It was wonderful to see you today.  Please bring ALL of your medications with you to every visit.   Thank you for choosing [facility].   Please call [phone] with any questions about today's appointment.  Please be sure to schedule follow up at the front  desk before you leave today.   Dorris Singh, MD  Family Medicine

## 2020-02-02 NOTE — Progress Notes (Addendum)
    SUBJECTIVE:   CHIEF COMPLAINT / HPI:   [name] Cocke is a pleasant 34 year old woman with history of anxiety, overweight status, and exercise induced asthma presenting today for routine follow up.   Anxiety Patient reports anxiety is okay. GAD-7 is 11. Reports she worries citalopram makes her tired. Takes 40 mg nightly. Reports she goes to bed between 1030 and midnight. Reports she wakes upon 630 AM. No alcohol or smoking. Reports her youngest is intermittently sleeping through the night. She is separating from her husband, leaving for apartment July 5. No SI/HI. Feels like she is doing well--has job interview for paralegal position pending.   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 Intermittently feels her asthma onset. No CP, dyspnea, LE edema. She is out of asthma.   PERTINENT  PMH / PSH/Family/Social History :  Anxiety, Asthma   OBJECTIVE:   BP 110/65   Pulse 63   Ht 5\' 2"  (1.575 m)   Wt 142 lb 6.4 oz (64.6 kg)   SpO2 99%   BMI 26.05 kg/m   HEENT: Sclera anicteric. Dentition is moderate. Appears well hydrated. Hyperpigmented papular lesion with evidence of prior trauma, posterior left side of neck 3 cm inferior to hairline  Neck: Supple Cardiac: Regular rate and rhythm. Normal S1/S2. No murmurs, rubs, or gallops appreciated. Lungs: Clear bilaterally to ascultation.  Abdomen: Normoactive bowel sounds. No tenderness to deep or light palpation. No rebound or guarding.  Extremities: Warm, well perfused without edema.  Skin: Warm, dry Psych: Pleasant and appropriate     ASSESSMENT/PLAN:    Exercise-induced asthma Albuterol as needed prior to exercise.  Discussed return precautions.  GAD, not well controlled with medication side effects, requiring further adjustment to medication  Congratulated on changes in moving forward with separating from her husband.  Recommend decreasing citalopram to 20 mg nightly.  We will follow-up in a few weeks to  check symptoms.  Could consider addition of BuSpar in future.  All questions answered.  Reviewed reasons to call and return to care.  Acrochordon vs irritated nevus, given repetitive trauma and growth, referred to Dermatology for removal.    HCM UTD on Pap Discuss COVID vaccine at follow up    Dorris Singh, [place]

## 2020-02-02 NOTE — Assessment & Plan Note (Signed)
Congratulated on changes in moving forward with separating from her husband.  Recommend decreasing citalopram to 20 mg nightly.  We will follow-up in a few weeks to check symptoms.  Could consider addition of BuSpar in future.  All questions answered.  Reviewed reasons to call and return to care.

## 2020-02-23 ENCOUNTER — Other Ambulatory Visit: Payer: Self-pay

## 2020-02-23 ENCOUNTER — Telehealth (INDEPENDENT_AMBULATORY_CARE_PROVIDER_SITE_OTHER): Payer: Medicaid Other | Admitting: Family Medicine

## 2020-02-23 DIAGNOSIS — F411 Generalized anxiety disorder: Secondary | ICD-10-CM

## 2020-02-23 NOTE — Progress Notes (Signed)
Attempted to call [facility]. Also sent link via MyChart without answer. Will send MyChart message to follow up.   Dorris Singh, MD  Family Medicine Teaching Service

## 2020-03-04 ENCOUNTER — Other Ambulatory Visit: Payer: Self-pay

## 2020-03-04 ENCOUNTER — Ambulatory Visit (INDEPENDENT_AMBULATORY_CARE_PROVIDER_SITE_OTHER): Payer: Medicaid Other | Admitting: Family Medicine

## 2020-03-04 VITALS — BP 101/80 | HR 88 | Ht 62.0 in | Wt 141.8 lb

## 2020-03-04 DIAGNOSIS — D224 Melanocytic nevi of scalp and neck: Secondary | ICD-10-CM | POA: Diagnosis not present

## 2020-03-04 DIAGNOSIS — L989 Disorder of the skin and subcutaneous tissue, unspecified: Secondary | ICD-10-CM | POA: Insufficient documentation

## 2020-03-04 HISTORY — DX: Disorder of the skin and subcutaneous tissue, unspecified: L98.9

## 2020-03-04 NOTE — Progress Notes (Signed)
Patient ID: Alejandra Conway, female   DOB: 10-28-1985, 34 y.o.   MRN: [id number] While cosigning the resident's note, I noticed that the patient screened positive on her PHQ2 score. However,the residents were not informed and the PHQ9 was not offered.  I gave the patient a follow-up call. She denies suicidal ideation. I advised her to f/u with her PCP.

## 2020-03-04 NOTE — Assessment & Plan Note (Signed)
Benign-appearing skin tag was removed today and sent to pathology per patient's request.  Risks, benefits, and alternatives were discussed with the patient prior to the procedure.  She tolerated the procedure well.  We will follow up with her regarding pathology results.

## 2020-03-04 NOTE — Progress Notes (Signed)
    SUBJECTIVE:   CHIEF COMPLAINT / HPI:   Skin tag Patient reports that she has had a lesion on the posterior left side of her neck for as long as she can remember.  She does not think that it has changed at all, and it does not not bleed.  She has had several similar lesions on her abdomen and back, which have been removed and biopsied previously.  There have been no concerning pathologies on these biopsies.  She is interested in having this nevus removed today.  PERTINENT  PMH / PSH: Dysplastic nevi  OBJECTIVE:   BP 101/80   Pulse 88   Ht 5\' 2"  (1.575 m)   Wt 141 lb 12.8 oz (64.3 kg)   LMP 02/02/2020 (Approximate)   SpO2 99%   BMI 25.94 kg/m   General: well appearing, appears stated age Skin: Well-circumscribed, pedunculated, hyperpigmented skin tag on posterior neck.  No irregular borders or variegated coloring.  Please refer to photograph below.  Procedure: Excision of Benign Skin Lesion Procedure Note   PRE-OP DIAGNOSIS: skin tag POST-OP DIAGNOSIS: Same  PROCEDURE: skin lesion excision Performing Physician: Kathrene Alu, MD  PROCEDURE:  _  Shave Biopsy    x  Scissors        _  Cryotherapy        _  Punch (Size _)   The area surrounding the skin lesion was prepared and draped in the usual sterile manner. The lesion was removed in the usual manner by the biopsy method noted above. Hemostasis was assured.   Closure:     _  Monsel's for hemostasis              _  suture _                   x  None   Followup: The patient tolerated the procedure well without complications.  Standard post-procedure care is explained and return precautions are given.     ASSESSMENT/PLAN:   Benign skin growth Benign-appearing skin tag was removed today and sent to pathology per patient's request.  Risks, benefits, and alternatives were discussed with the patient prior to the procedure.  She tolerated the procedure well.  We will follow up with her regarding pathology results.       Kathrene Alu, MD [place]

## 2020-05-16 ENCOUNTER — Encounter: Payer: Self-pay | Admitting: Family Medicine

## 2020-06-15 ENCOUNTER — Other Ambulatory Visit: Payer: Self-pay

## 2020-06-15 ENCOUNTER — Ambulatory Visit (INDEPENDENT_AMBULATORY_CARE_PROVIDER_SITE_OTHER): Payer: Medicaid Other | Admitting: Family Medicine

## 2020-06-15 ENCOUNTER — Encounter: Payer: Self-pay | Admitting: Family Medicine

## 2020-06-15 VITALS — BP 114/66 | HR 71 | Ht 62.0 in | Wt 137.5 lb

## 2020-06-15 DIAGNOSIS — M62830 Muscle spasm of back: Secondary | ICD-10-CM | POA: Diagnosis not present

## 2020-06-15 DIAGNOSIS — R300 Dysuria: Secondary | ICD-10-CM

## 2020-06-15 HISTORY — DX: Dysuria: R30.0

## 2020-06-15 HISTORY — DX: Muscle spasm of back: M62.830

## 2020-06-15 LAB — POCT URINALYSIS DIP (MANUAL ENTRY)
Bilirubin, UA: NEGATIVE
Blood, UA: NEGATIVE
Glucose, UA: NEGATIVE mg/dL
Ketones, POC UA: NEGATIVE mg/dL
Nitrite, UA: NEGATIVE
Protein Ur, POC: NEGATIVE mg/dL
Spec Grav, UA: 1.02 (ref 1.010–1.025)
Urobilinogen, UA: 0.2 E.U./dL
pH, UA: 6 (ref 5.0–8.0)

## 2020-06-15 LAB — POCT UA - MICROSCOPIC ONLY

## 2020-06-15 MED ORDER — METHOCARBAMOL 500 MG PO TABS: 500.0000 mg | ORAL_TABLET | Freq: Every evening | ORAL | 0 refills | Status: DC | PRN

## 2020-06-15 NOTE — Assessment & Plan Note (Signed)
Patient with recent history of pain with urination, urinary frequency, and low abdominal pain which is now resolved after taking Azo. -Urinalysis ordered at today's visit -We will follow-up with results and treat as needed

## 2020-06-15 NOTE — Patient Instructions (Signed)
Thank you for coming in to see [place] today! Please see below to review our plan for today's visit:  1.  I have ordered Robaxin/methocarbamol 500 mg (a muscle relaxer) which you can take at night as needed for painful muscle spasms. 2.  I have ordered a urinalysis to evaluate for urinary tract infection.  I will call you with results and treat as needed. 3.  Please look up Dr. Susie Cassette with [facility] if you are looking for a chiropractor.  Please call the clinic at [phone] if your symptoms worsen or you have any concerns. It was our pleasure to serve you!   Dr. Milus Banister [facility]

## 2020-06-15 NOTE — Progress Notes (Signed)
SUBJECTIVE:   CHIEF COMPLAINT / HPI:   Low back and pelvic pain: Patient wrote MyChart message to her PCP on 05/16/2020: "I have been having some lower back pain for a month at the least and was waiting for it to pass as I have history with my lower back. However, this past Wednesday it went out on me and Thursday I could not even walk. Friday I started to be able to at least get from point A to a short distanced point B. The pain is constant and I cannot sit stand or lay for very long. I do get a little relief when laying on my stomach over my yoga ball. I've tried heat/cold, stretching and hot showers. To cover further detail, it radiates to my hips and my legs and I have had loss of appetite and an upset stomach and I did start my cycle on Thursday which was heavier than usual the first 2 days."  Lumbar imaging on showed normal Xray in 2014 when she was having similar symptoms.  Patient reports today that her symptoms are not bothering her today.  She is not exactly sure what is causing it but believes it to be musculoskeletal in nature.  She reports that she frequently has muscle spasms in her back, makes bending forward very difficult.  She is also a mom of 3 kids.  No CVA tenderness appreciated on exam.  No concern for urinary or fecal incontinence or numbness/tingling to lower extremities.  She used a friend's meloxicam to reduce her pain which was successful, however she reports it made her sleepy.  No recent falls, trauma, accidents.  No fevers, body aches, weight loss.  Painful urination: Patient reports that few days ago on Monday 9/27 she started having symptoms of UTI with painful urination and pain in her suprapubic region of her abdomen.  She went to the store and was using Azo or an equivalent.  She now feels better and does not have any more concerns.  She reports that she started her menstrual cycle the day after.  She was concerned for associated left-sided flank pain which is also  since resolved with the Azo.  Health maintenance: due for COVID and Flu vaccines  PERTINENT  PMH / PSH:  Noncontributory  OBJECTIVE:   BP 114/66   Pulse 71   Ht 5\' 2"  (1.575 m)   Wt 137 lb 8 oz (62.4 kg)   LMP 06/14/2020   SpO2 99%   BMI 25.15 kg/m    Physical exam: General: Well-appearing patient, very pleasant Respiratory: Comfortable work of breathing, speaking complete sentences MSK: Hypertonicity of paravertebral spinal musculature appreciated on physical exam, somatic dysfunction with posterior right innominate appreciated and addressed with gentle muscle energy, well-tolerated by the patient.   ASSESSMENT/PLAN:   Dysuria Patient with recent history of pain with urination, urinary frequency, and low abdominal pain which is now resolved after taking Azo. -Urinalysis ordered at today's visit -We will follow-up with results and treat as needed  Back muscle spasm Hypertonic musculature appreciated on physical exam.  Gentle osteopathic manipulation performed with soft tissue techniques, myofascial release, and muscle energy.  No neurological deficits appreciated on physical exam, no concern at this time for cauda equina syndrome. -Ordered Robaxin/methocarbamol 500 mg to take at night as needed for painful muscle spasms. -Patient given contact information for Dr. Susie Cassette with [facility] if she desires to look for a chiropractor     Daisy Floro, DO [facility]

## 2020-06-15 NOTE — Assessment & Plan Note (Addendum)
Hypertonic musculature appreciated on physical exam.  Gentle osteopathic manipulation performed with soft tissue techniques, myofascial release, and muscle energy.  No neurological deficits appreciated on physical exam, no concern at this time for cauda equina syndrome. -Ordered Robaxin/methocarbamol 500 mg to take at night as needed for painful muscle spasms. -Patient given contact information for Dr. Susie Cassette with [facility] if she desires to look for a chiropractor

## 2020-07-13 ENCOUNTER — Telehealth: Payer: Self-pay | Admitting: Family Medicine

## 2020-07-13 ENCOUNTER — Other Ambulatory Visit: Payer: Self-pay

## 2020-07-13 ENCOUNTER — Ambulatory Visit: Payer: Medicaid Other | Admitting: Family Medicine

## 2020-07-13 VITALS — BP 108/74 | HR 89 | Ht 62.0 in | Wt 138.4 lb

## 2020-07-13 DIAGNOSIS — G8929 Other chronic pain: Secondary | ICD-10-CM

## 2020-07-13 DIAGNOSIS — N390 Urinary tract infection, site not specified: Secondary | ICD-10-CM | POA: Diagnosis not present

## 2020-07-13 DIAGNOSIS — M545 Low back pain, unspecified: Secondary | ICD-10-CM | POA: Diagnosis not present

## 2020-07-13 DIAGNOSIS — R3915 Urgency of urination: Secondary | ICD-10-CM | POA: Insufficient documentation

## 2020-07-13 HISTORY — DX: Urgency of urination: R39.15

## 2020-07-13 LAB — POCT URINALYSIS DIP (MANUAL ENTRY)
Bilirubin, UA: NEGATIVE
Glucose, UA: NEGATIVE mg/dL
Ketones, POC UA: NEGATIVE mg/dL
Nitrite, UA: POSITIVE — AB
Spec Grav, UA: >=1.03 — AB (ref 1.010–1.025)
Urobilinogen, UA: 0.2 E.U./dL
pH, UA: 5.5 (ref 5.0–8.0)

## 2020-07-13 LAB — POCT UA - MICROSCOPIC ONLY

## 2020-07-13 MED ORDER — MELOXICAM 7.5 MG PO TABS: 7.5000 mg | ORAL_TABLET | Freq: Every day | ORAL | 0 refills | Status: AC

## 2020-07-13 MED ORDER — CEPHALEXIN 500 MG PO CAPS: 500.0000 mg | ORAL_CAPSULE | Freq: Two times a day (BID) | ORAL | 0 refills | Status: AC

## 2020-07-13 NOTE — Assessment & Plan Note (Addendum)
Acute on chronic. Extensive history of chronic back pain. Most recent visit 1 month prior.  Most recent flare 4 to 5 days prior. Unrelieved by Robaxin.  Relieved with stretching and lying down. UA today consistent with UTI and could be contributory to back discomfort. Most recent lumbar imaging 04/26/2020 normal. Will refer to physical therapy and consider repeat imaging if no improvement after therapy. Will discontinue robaxin and start meloxicam.  - Ambulatory referral to Physical Therapy - meloxicam (MOBIC) 7.5 MG tablet; Take 1 tablet (7.5 mg total) by mouth daily for 14 days.  Dispense: 14 tablet; Refill: 0

## 2020-07-13 NOTE — Telephone Encounter (Signed)
Patient aware of UTI results and treatment plan and voiced understanding.   Nikitas Davtyan Autry-Lott, DO 07/13/2020, 10:19 AM PGY-2, [place]

## 2020-07-13 NOTE — Patient Instructions (Signed)
It was wonderful to see you today.  Please bring ALL of your medications with you to every visit.   Today we talked about:  Lower back pain. We stopped the robaxin and I have prescribed a 2 week course of meloxicam. I have also referred you to PT.   Follow up if your symptoms do not improve after PT therapy. We discussed that home exercises beyond PT will be beneficial.  Please call the clinic at [phone] if your symptoms worsen or you have any concerns. It was our pleasure to serve you.  Dr. Janus Molder

## 2020-07-13 NOTE — Progress Notes (Signed)
SUBJECTIVE:   CHIEF COMPLAINT / HPI:   Alejandra Conway 34 yo F who presents to access to care for the below issue.   Low Back Pain Recently seen by Dr. Ouida Sills 10/5 and given Robaxin states this is not helping.  Most recent flareup was Thursday and Friday of last week.  Back pain is lower center and radiates to the hips but not down the legs.  Is made better by lying flat or on the stomach.  Denies fever, night sweats, or trauma to the area.  Also denies heavy lifting but does have a 51-year-old son at home that she does carry.  Endorses recent cease of activity of cardiac kickboxing 1 month prior.  Has history of back pain with a bulging disc in high school.  Thought of seeking care from chiropractor but has not done so.  Endorses that she has tried stretching at home in which she can feel her muscles pulling.  Also endorses urinary urgency but not dysuria.  PERTINENT  PMH / PSH: Moderate depression, anxiety  OBJECTIVE:   LMP 06/14/2020   General: Appears well, no acute distress. Age appropriate. Cardiac: RRR, normal heart sounds, no murmurs Respiratory: CTAB, normal effort MSK/Extremities: No leg length inequality noted.  No midline point tenderness level of lumbar or top of sacrum.  Full range of motion at level of hip bilaterally.  Gait is normal.  Patient with varus stance.  Results for orders placed or performed in visit on 07/13/20 (from the past 72 hour(s))  POCT urinalysis dipstick     Status: Abnormal   Collection Time: 07/13/20  9:30 AM  Result Value Ref Range   Color, UA yellow yellow   Clarity, UA cloudy (A) clear   Glucose, UA negative negative mg/dL   Bilirubin, UA negative negative   Ketones, POC UA negative negative mg/dL   Spec Grav, UA >=1.030 (A) 1.010 - 1.025   Blood, UA trace-lysed (A) negative   pH, UA 5.5 5.0 - 8.0   Protein Ur, POC trace (A) negative mg/dL   Urobilinogen, UA 0.2 0.2 or 1.0 E.U./dL   Nitrite, UA Positive (A) Negative   Leukocytes, UA  Small (1+) (A) Negative  POCT UA - Microscopic Only     Status: Abnormal   Collection Time: 07/13/20  9:30 AM  Result Value Ref Range   WBC, Ur, HPF, POC 5-10    RBC, urine, microscopic OCCASIONAL    Bacteria, U Microscopic FEW    Epithelial cells, urine per micros 3-8    Crystals, Ur, HPF, POC CALCIUM OXALATE    PHQ9 SCORE ONLY 07/13/2020 06/15/2020 03/04/2020  PHQ-9 Total Score 8 8 2    ASSESSMENT/PLAN:   Midline low back pain without sciatica Acute on chronic. Extensive history of chronic back pain. Most recent visit 1 month prior.  Most recent flare 4 to 5 days prior. Unrelieved by Robaxin.  Relieved with stretching and lying down. UA today consistent with UTI and could be contributory to back discomfort. Most recent lumbar imaging 04/26/2020 normal. Will refer to physical therapy and consider repeat imaging if no improvement after therapy. Will discontinue robaxin and start meloxicam.  - Ambulatory referral to Physical Therapy - meloxicam (MOBIC) 7.5 MG tablet; Take 1 tablet (7.5 mg total) by mouth daily for 14 days.  Dispense: 14 tablet; Refill: 0  Urinary tract infection without hematuria UA consistent with UTI. Symptomatic urgency with associated back pain. Encouraged to increase water intake -Keflex 500 mg BID x7 days -Follow up  if symptoms fail to resolve with treatment   Alejandra Conway, [place]

## 2020-07-15 DIAGNOSIS — N390 Urinary tract infection, site not specified: Secondary | ICD-10-CM | POA: Insufficient documentation

## 2020-07-15 HISTORY — DX: Urinary tract infection, site not specified: N39.0

## 2020-07-15 NOTE — Assessment & Plan Note (Signed)
UA consistent with UTI. Symptomatic urgency with associated back pain. Encouraged to increase water intake -Keflex 500 mg BID x7 days -Follow up if symptoms fail to resolve with treatment

## 2020-07-22 ENCOUNTER — Encounter: Payer: Self-pay | Admitting: Family Medicine

## 2020-08-02 ENCOUNTER — Other Ambulatory Visit: Payer: Self-pay

## 2020-08-02 ENCOUNTER — Ambulatory Visit: Payer: Medicaid Other | Attending: Family Medicine | Admitting: Physical Therapy

## 2020-08-02 ENCOUNTER — Encounter: Payer: Self-pay | Admitting: Physical Therapy

## 2020-08-02 DIAGNOSIS — R293 Abnormal posture: Secondary | ICD-10-CM | POA: Diagnosis not present

## 2020-08-02 DIAGNOSIS — G8929 Other chronic pain: Secondary | ICD-10-CM | POA: Insufficient documentation

## 2020-08-02 DIAGNOSIS — M545 Low back pain, unspecified: Secondary | ICD-10-CM | POA: Insufficient documentation

## 2020-08-02 NOTE — Therapy (Addendum)
[place], [place], [address] Phone: [phone]   Fax:  [phone]  Physical Therapy Evaluation  Patient Details  Name: Alejandra Conway MRN: [id number] Date of Birth: October 21, 1985 Referring Provider (PT): Madison Hickman, MD   Encounter Date: 08/02/2020   PT End of Session - 08/02/20 1249    Visit Number 1    Date for PT Re-Evaluation 09/25/20    Authorization Type MCD- healthy blue-auth submitted 11/22    PT Start Time 1247    PT Stop Time 1324    PT Time Calculation (min) 37 min    Activity Tolerance Patient tolerated treatment well    Behavior During Therapy [facility] for tasks assessed/performed           Past Medical History:  Diagnosis Date  . Abnormal Pap smear   . Asthma   . Depression with anxiety 05/03/2015  . Migraine     History reviewed. No pertinent surgical history.  There were no vitals filed for this visit.    Subjective Assessment - 08/02/20 1250    Subjective Started at least 2 mo ago. I had a spasm and could not get out of bed. Function increased but still hurt and it hit me hard again. It does help more to walk but still have a dull pain. Hurts to get up after sitting. Sometimes goes down one or both legs at a time. Insidious onset of spasm. I have a 32 year old. Separated from husband in July and noted some pain but no spasm. They said I had a slight bulging disk in high school. Felt like I had a UTI with the first spasm but was not diagnosed, did diagnose UTI with the second. I started having pelvic pain between flare ups that went down my leg.    How long can you walk comfortably? feels better to walk but still a dull ache    Patient Stated Goals decrease pain    Currently in Pain? Yes    Pain Score 4     Pain Location Back    Pain Orientation Right;Left;Lower    Pain Descriptors / Indicators Aching    Pain Radiating Towards bil LEs    Aggravating Factors  sitting    Pain  Relieving Factors moving around              [facility] PT Assessment - 08/02/20 0001      Assessment   Medical Diagnosis chronic LBP    Referring Provider (PT) Madison Hickman, MD    Onset Date/Surgical Date --   pain began around July, 21; first spasm about 2 mo ago   Prior Therapy no      Precautions   Precautions None      Restrictions   Weight Bearing Restrictions No      Balance Screen   Has the patient fallen in the past 6 months No      [place] residence    Living Arrangements Children    Additional Comments 3 boys      Prior Function   Level of Independence Independent    Vocation Requirements not working      Cognition   Overall Cognitive Status Within Functional Limits for tasks assessed      Observation/Other Assessments   Focus on Therapeutic Outcomes (FOTO)  n/a MCD      Sensation   Additional Comments abnormal sensations with referred pain into BLE  Posture/Postural Control   Posture Comments sits leaning to Lt side      Palpation   Palpation comment Lt SIJ elevation in standing- supine Lt post illiac rotation      Ambulation/Gait   Gait Comments vaulting over Rt LE with trendelenburg in Rt stance phase                      Objective measurements completed on examination: See above findings.       [place] Adult PT Treatment/Exercise - 08/02/20 0001      Exercises   Exercises Lumbar      Lumbar Exercises: Stretches   Other Lumbar Stretch Exercise hesh self correction for Lt post illium    Other Lumbar Stretch Exercise LT piriformis release over half bolster      Lumbar Exercises: Supine   AB Set Limitations ab set with iso adduction      Lumbar Exercises: Quadruped   Other Quadruped Lumbar Exercises ab set with rocking- more low back plling                  PT Education - 08/02/20 1345    Education Details anatomy of condition, POC, HEP, exercise form/rationale    Person(s)  Educated Patient    Methods Explanation;Demonstration;Tactile cues;Verbal cues;Handout    Comprehension Verbalized understanding;Returned demonstration;Verbal cues required;Tactile cues required;Need further instruction            PT Short Term Goals - 08/02/20 1342      PT SHORT TERM GOAL #1   Title pt will be able to tolerate sitting in a neutral position for at least 10 min    Baseline was unable to sit neutral prior to treatment, shifted post treatment due to pulling sensation    Time 4    Period Weeks    Status New    Target Date 09/03/20      PT SHORT TERM GOAL #2   Title pt will be independent in short term core stabilization program    Baseline began with gentleexercises at eval, needs to be progressed    Time 4    Period Weeks    Status New    Target Date 09/03/20      PT SHORT TERM GOAL #3   Title pt will demo equalized gait pattern to decrease abnormal pulls    Baseline trendelenburg in Rt stance phase    Time 4    Period Weeks    Status New    Target Date 09/03/20                     Plan - 08/02/20 1328    Clinical Impression Statement Pt presents to PT with complaints of LBP that began with significant spasm of insidious onset. Denied any irregular pain or events following delivery of 32 year old son. Significant pelvic rotation- Lt post- with functional LLD- Lt longer- found and corrected today. Educated on time required to decrease spasm after being present for a chronic period of time. she was able to sit with neutral posture following correction today but could still feel the pull across buttocks. Will cont to benefit from skilled PT to decrease muscular spasm, stabilize pelvis to neutral rotation and reach functional goals.    Personal Factors and Comorbidities Comorbidity 1;Time since onset of injury/illness/exacerbation    Comorbidities recent UTI    Examination-Activity Limitations Bathing;Bend;Sit;Sleep;Caring for Others;Carry;Stand;Lift     Stability/Clinical Decision Making Stable/Uncomplicated  Clinical Decision Making Low    Rehab Potential Good    PT Frequency --   3 visits in first auth, 2/week through end of year and recert in new year   PT Duration 8 weeks    PT Treatment/Interventions ADLs/Self Care Home Management;Cryotherapy;Electrical Stimulation;Ultrasound;Traction;Moist Heat;Iontophoresis 4mg /ml Dexamethasone;Stair training;Functional mobility training;Therapeutic activities;Therapeutic exercise;Balance training;Neuromuscular re-education;Manual techniques;Patient/family education;Passive range of motion;Dry needling;Taping;Joint Manipulations;Spinal Manipulations    PT Next Visit Plan check pelvic rotation, consider DN, core stabilization    PT Home Exercise Plan Hesh self correction for Lt post innom rotation, [id number]    Consulted and Agree with Plan of Care Patient           Patient will benefit from skilled therapeutic intervention in order to improve the following deficits and impairments:  Decreased range of motion, Increased muscle spasms, Decreased activity tolerance, Pain, Improper body mechanics, Postural dysfunction  Visit Diagnosis: Chronic bilateral low back pain without sciatica - Plan: PT plan of care cert/re-cert  Abnormal posture - Plan: PT plan of care cert/re-cert     Problem List Patient Active Problem List   Diagnosis Date Noted  . Urinary tract infection without hematuria 07/15/2020  . Urinary urgency 07/13/2020  . Dysuria 06/15/2020  . Back muscle spasm 06/15/2020  . Benign skin growth 03/04/2020  . Exercise-induced asthma 02/02/2020  . Overweight 11/15/2018  . Migraine 11/16/2015  . Depression, Moderate 05/13/2015  . Stress induced anxiety 05/04/2015  . Midline low back pain without sciatica 04/16/2013   Jahmani Staup C. Arleth Mccullar PT, DPT 08/02/20 1:53 PM   [facility] [address] [place], [place], [address] Phone:  [phone]   Fax:  [phone]  Name: Macie Baum MRN: [id number] Date of Birth: [birth year]   Check all possible CPT codes: 42876- Therapeutic Exercise, [phone]- Neuro Re-education, [phone] - Gait Training, [phone] - Manual Therapy, [phone] - Therapeutic Activities, [phone] - Self Care and Other Dry needling

## 2020-08-09 ENCOUNTER — Telehealth: Payer: Self-pay | Admitting: Physical Therapy

## 2020-08-09 ENCOUNTER — Encounter: Payer: Self-pay | Admitting: Physical Therapy

## 2020-08-09 ENCOUNTER — Ambulatory Visit: Payer: Medicaid Other | Admitting: Physical Therapy

## 2020-08-09 NOTE — Telephone Encounter (Signed)
Therapy called patient regarding no-show visit. The patient did not answer. Therapy left a message advising patient to call if she can not come to the next visit and giving her the number to call.

## 2020-08-16 ENCOUNTER — Other Ambulatory Visit: Payer: Self-pay

## 2020-08-16 ENCOUNTER — Ambulatory Visit: Payer: Medicaid Other | Attending: Family Medicine | Admitting: Physical Therapy

## 2020-08-16 ENCOUNTER — Encounter: Payer: Self-pay | Admitting: Physical Therapy

## 2020-08-16 DIAGNOSIS — G8929 Other chronic pain: Secondary | ICD-10-CM | POA: Diagnosis not present

## 2020-08-16 DIAGNOSIS — R293 Abnormal posture: Secondary | ICD-10-CM | POA: Insufficient documentation

## 2020-08-16 DIAGNOSIS — M545 Low back pain, unspecified: Secondary | ICD-10-CM | POA: Insufficient documentation

## 2020-08-16 NOTE — Therapy (Signed)
[place], [place], [address] Phone: [phone]   Fax:  [phone]  Physical Therapy Treatment  Patient Details  Name: Alejandra Conway MRN: [id number] Date of Birth: 1985/11/09 Referring Provider (PT): Madison Hickman, MD   Encounter Date: 08/16/2020   PT End of Session - 08/16/20 0954    Visit Number 2    Number of Visits 12    Date for PT Re-Evaluation 09/25/20    Authorization Type MCD- healthy blue-auth submitted 11/22    PT Start Time 0850    PT Stop Time 0931    PT Time Calculation (min) 41 min    Activity Tolerance Patient tolerated treatment well    Behavior During Therapy [facility] for tasks assessed/performed           Past Medical History:  Diagnosis Date  . Abnormal Pap smear   . Asthma   . Depression with anxiety 05/03/2015  . Migraine     History reviewed. No pertinent surgical history.  There were no vitals filed for this visit.   Subjective Assessment - 08/16/20 0855    Subjective Patient reports it is better. She has been doing her stretches. She continues to have some pain when she is sitting and pain in the morning. Yesterday she flet like it was the best that she has felt.    How long can you walk comfortably? feels better to walk but still a dull ache    Patient Stated Goals decrease pain    Currently in Pain? Yes    Pain Score 2     Pain Location Back    Pain Orientation Left    Pain Descriptors / Indicators Aching    Pain Onset More than a month ago    Pain Frequency Constant    Aggravating Factors  sitting    Pain Relieving Factors moving around    Multiple Pain Sites No                             [facility] Adult PT Treatment/Exercise - 08/16/20 0001      Lumbar Exercises: Stretches   Piriformis Stretch Limitations Patient given a glute stretch for post needle soreness 3x20 sec hold     Other Lumbar Stretch Exercise Patient given a tennis ball  for post needle soreness       Lumbar Exercises: Supine   AB Set Limitations reviewed abdominal breathing     Clam Limitations supine band also reavied in sititng . Red band       Manual Therapy   Manual Therapy Soft tissue mobilization;Joint mobilization    Manual therapy comments skilled palpation of trigger point    Joint Mobilization inferior glide bilateral with noted improvement in pain free flexion     Soft tissue mobilization to gluteal and lower back             Trigger Point Dry Needling - 08/16/20 0001    Consent Given? Yes    Education Handout Provided Yes    Muscles Treated Back/Hip Gluteus medius    Other Dry Needling using a .30x75 needle     Gluteus Medius Response Palpable increased muscle length;Twitch response elicited                PT Education - 08/16/20 0858    Education Details reviewed benefits and risk    Person(s) Educated Patient    Methods Explanation;Demonstration;Tactile cues;Verbal cues  Comprehension Returned demonstration;Verbalized understanding;Verbal cues required;Tactile cues required            PT Short Term Goals - 08/02/20 1342      PT SHORT TERM GOAL #1   Title pt will be able to tolerate sitting in a neutral position for at least 10 min    Baseline was unable to sit neutral prior to treatment, shifted post treatment due to pulling sensation    Time 4    Period Weeks    Status New    Target Date 09/03/20      PT SHORT TERM GOAL #2   Title pt will be independent in short term core stabilization program    Baseline began with gentleexercises at eval, needs to be progressed    Time 4    Period Weeks    Status New    Target Date 09/03/20      PT SHORT TERM GOAL #3   Title pt will demo equalized gait pattern to decrease abnormal pulls    Baseline trendelenburg in Rt stance phase    Time 4    Period Weeks    Status New    Target Date 09/03/20                    Plan - 08/16/20 0955    Clinical  Impression Statement Patient is making good progress. her hipo motion has improved significantly. She had a large trigger point in her gluteal. She had a great twitch respose to dry needling. She has  a smaller trigger point on the right side but we well see how she reacts to the neddling on the left. She was given exercises geared towqards post needle.    Personal Factors and Comorbidities Comorbidity 1;Time since onset of injury/illness/exacerbation    Examination-Activity Limitations Bathing;Bend;Sit;Sleep;Caring for Others;Carry;Stand;Lift    Stability/Clinical Decision Making Stable/Uncomplicated    Clinical Decision Making Low    PT Duration 8 weeks    PT Treatment/Interventions ADLs/Self Care Home Management;Cryotherapy;Electrical Stimulation;Ultrasound;Traction;Moist Heat;Iontophoresis 4mg /ml Dexamethasone;Stair training;Functional mobility training;Therapeutic activities;Therapeutic exercise;Balance training;Neuromuscular re-education;Manual techniques;Patient/family education;Passive range of motion;Dry needling;Taping;Joint Manipulations;Spinal Manipulations    PT Next Visit Plan check pelvic rotation, consider DN, core stabilization ; patient wasts to get back to running and kick boxing. If she continues to progress well, consider progressing to single leg stance activity.    PT Home Exercise Plan Hesh self correction for Lt post innom rotation, [id number]    Consulted and Agree with Plan of Care Patient           Patient will benefit from skilled therapeutic intervention in order to improve the following deficits and impairments:  Decreased range of motion, Increased muscle spasms, Decreased activity tolerance, Pain, Improper body mechanics, Postural dysfunction  Visit Diagnosis: Chronic bilateral low back pain without sciatica  Abnormal posture     Problem List Patient Active Problem List   Diagnosis Date Noted  . Urinary tract infection without hematuria 07/15/2020  .  Urinary urgency 07/13/2020  . Dysuria 06/15/2020  . Back muscle spasm 06/15/2020  . Benign skin growth 03/04/2020  . Exercise-induced asthma 02/02/2020  . Overweight 11/15/2018  . Migraine 11/16/2015  . Depression, Moderate 05/13/2015  . Stress induced anxiety 05/04/2015  . Midline low back pain without sciatica 04/16/2013    Carney Living PT DPT  08/16/2020, 10:11 AM  [facility] [address] [place], [place], [address] Phone: [phone]   Fax:  [phone]  Name:  [name] Badon MRN: [id number] Date of Birth: 31-Jan-1986

## 2020-08-25 ENCOUNTER — Encounter: Payer: Self-pay | Admitting: Physical Therapy

## 2020-08-25 ENCOUNTER — Ambulatory Visit: Payer: Medicaid Other | Admitting: Physical Therapy

## 2020-08-25 ENCOUNTER — Other Ambulatory Visit: Payer: Self-pay

## 2020-08-25 DIAGNOSIS — R293 Abnormal posture: Secondary | ICD-10-CM | POA: Diagnosis not present

## 2020-08-25 DIAGNOSIS — G8929 Other chronic pain: Secondary | ICD-10-CM

## 2020-08-25 DIAGNOSIS — M545 Low back pain, unspecified: Secondary | ICD-10-CM | POA: Diagnosis not present

## 2020-08-25 NOTE — Therapy (Signed)
[place], [place], [address] Phone: [phone]   Fax:  [phone]  Physical Therapy Treatment  Patient Details  Name: Alejandra Conway MRN: [id number] Date of Birth: [birth year] Referring Provider (PT): Madison Hickman, MD   Encounter Date: 08/25/2020   PT End of Session - 08/25/20 0903    Visit Number 3    Number of Visits 12    Date for PT Re-Evaluation 09/25/20    Authorization Type MCD- healthy blue-auth submitted 11/22    PT Start Time 0850    PT Stop Time 0930    PT Time Calculation (min) 40 min           Past Medical History:  Diagnosis Date  . Abnormal Pap smear   . Asthma   . Depression with anxiety 05/03/2015  . Migraine     History reviewed. No pertinent surgical history.  There were no vitals filed for this visit.   Subjective Assessment - 08/25/20 0922    Subjective Patient reports she feels like she is reaching a plateau in her progress.    Currently in Pain? Yes    Pain Score 1     Pain Location Back    Pain Orientation Lower    Pain Descriptors / Indicators Discomfort    Aggravating Factors  transition sit to stand    Pain Relieving Factors moving around                             [facility] Adult PT Treatment/Exercise - 08/25/20 0001      Lumbar Exercises: Stretches   Single Knee to Chest Stretch 2 reps;30 seconds    Piriformis Stretch Limitations 2 reps x 30 sec      Lumbar Exercises: Seated   Sit to Stand 10 reps    Sit to Stand Limitations cues for equal weight bearing      Lumbar Exercises: Supine   Clam Limitations bent knee fall outs with cues for core control    Bridge 10 reps    Bridge Limitations green band at knees   tightness in low back     Lumbar Exercises: Sidelying   Clam 20 reps    Clam Limitations green band    Hip Abduction 10 reps                  PT Education - 08/25/20 0940    Education Details HEP     Person(s) Educated Patient    Methods Explanation;Handout    Comprehension Verbalized understanding            PT Short Term Goals - 08/02/20 1342      PT SHORT TERM GOAL #1   Title pt will be able to tolerate sitting in a neutral position for at least 10 min    Baseline was unable to sit neutral prior to treatment, shifted post treatment due to pulling sensation    Time 4    Period Weeks    Status New    Target Date 09/03/20      PT SHORT TERM GOAL #2   Title pt will be independent in short term core stabilization program    Baseline began with gentleexercises at eval, needs to be progressed    Time 4    Period Weeks    Status New    Target Date 09/03/20      PT SHORT TERM GOAL #3  Title pt will demo equalized gait pattern to decrease abnormal pulls    Baseline trendelenburg in Rt stance phase    Time 4    Period Weeks    Status New    Target Date 09/03/20                    Plan - 08/25/20 0941    Clinical Impression Statement Pt reports improvement since starting PT however now feels like she is at a plateau. Reviewed HEP and progressed with gluteal strengthening. She fatigues but does not report increased pain with therex. Began sit-stand with cues to equalize her weightbearing bilaterally.    PT Next Visit Plan check pelvic rotation, consider DN, core stabilization ; patient wasts to get back to running and kick boxing. If she continues to progress well, consider progressing to single leg stance activity.    PT Home Exercise Plan Hesh self correction for Lt post innom rotation, [email or web address]           Patient will benefit from skilled therapeutic intervention in order to improve the following deficits and impairments:  Decreased range of motion,Increased muscle spasms,Decreased activity tolerance,Pain,Improper body mechanics,Postural dysfunction  Visit Diagnosis: Chronic bilateral low back pain without sciatica  Abnormal posture     Problem  List Patient Active Problem List   Diagnosis Date Noted  . Urinary tract infection without hematuria 07/15/2020  . Urinary urgency 07/13/2020  . Dysuria 06/15/2020  . Back muscle spasm 06/15/2020  . Benign skin growth 03/04/2020  . Exercise-induced asthma 02/02/2020  . Overweight 11/15/2018  . Migraine 11/16/2015  . Depression, Moderate 05/13/2015  . Stress induced anxiety 05/04/2015  . Midline low back pain without sciatica 04/16/2013    Dorene Ar, [place] 08/25/2020, 10:43 AM  [place], [place], [address] Phone: [phone]   Fax:  [phone]  Name: Alejandra Conway MRN: [id number] Date of Birth: 1986/08/20

## 2020-08-25 NOTE — Patient Instructions (Signed)
Access Code: [email or web address] URL: [email or web address] Date: 08/25/2020 Prepared by: Hessie Diener  Exercises Supine Hip Adduction Isometric with Diona Foley - 2 x daily - 7 x weekly - 10 reps - 3 breaths hold Supine Transversus Abdominis Bracing - Hands on Stomach Child's Pose Stretch - 3 x daily - 7 x weekly - 2 sets - 3 deep breaths hold Standing Glute Med Mobilization with Small Ball on Wall - 1 x daily - 7 x weekly - 3 sets - 10 reps - 1-2 min hold Supine Piriformis Stretch with Foot on Ground - 1 x daily - 7 x weekly - 3 sets - 3 reps - 20 hold Hooklying Clamshell with Resistance - 1 x daily - 7 x weekly - 3 sets - 10 reps Supine Bridge with Resistance Band - 1 x daily - 7 x weekly - 3 sets - 10 reps Clamshell with Resistance - 1 x daily - 7 x weekly - 2-3 sets - 10 reps Sidelying Hip Abduction - 1 x daily - 7 x weekly - 2-3 sets - 10 reps Sit to Stand without Arm Support - 1 x daily - 7 x weekly - 10 sets - 10 reps

## 2020-08-31 ENCOUNTER — Telehealth: Payer: Self-pay | Admitting: Physical Therapy

## 2020-08-31 ENCOUNTER — Ambulatory Visit: Payer: Medicaid Other | Admitting: Physical Therapy

## 2020-08-31 NOTE — Telephone Encounter (Signed)
LVM regarding NS today. Requested call back to schedule ERO or request D/C. Sheilah Rayos C. Hollis Oh PT, DPT 08/31/20 7:38 PM

## 2020-09-24 ENCOUNTER — Ambulatory Visit: Payer: Medicaid Other | Attending: Family Medicine

## 2020-09-24 ENCOUNTER — Other Ambulatory Visit: Payer: Self-pay

## 2020-09-24 DIAGNOSIS — G8929 Other chronic pain: Secondary | ICD-10-CM | POA: Insufficient documentation

## 2020-09-24 DIAGNOSIS — M533 Sacrococcygeal disorders, not elsewhere classified: Secondary | ICD-10-CM | POA: Diagnosis not present

## 2020-09-24 DIAGNOSIS — M545 Low back pain, unspecified: Secondary | ICD-10-CM | POA: Diagnosis not present

## 2020-09-24 DIAGNOSIS — R293 Abnormal posture: Secondary | ICD-10-CM | POA: Diagnosis not present

## 2020-09-24 NOTE — Therapy (Signed)
[place], [place], [address] Phone: [phone]   Fax:  [phone]  Physical Therapy Re-evaluation  Patient Details  Name: Alejandra Conway MRN: [id number] Date of Birth: 08-25-1986 Referring Provider (PT): Madison Hickman, MD   Encounter Date: 09/24/2020   PT End of Session - 09/24/20 0800    Visit Number 4    Number of Visits 12    Date for PT Re-Evaluation 11/19/20    Authorization Type MCD- healthy blue-auth submitted 11/22, new auth submitted 09/24/20    PT Start Time 0800    PT Stop Time 0900    PT Time Calculation (min) 60 min    Activity Tolerance Patient tolerated treatment well    Behavior During Therapy [facility] for tasks assessed/performed           Past Medical History:  Diagnosis Date  . Abnormal Pap smear   . Asthma   . Depression with anxiety 05/03/2015  . Migraine     History reviewed. No pertinent surgical history.  There were no vitals filed for this visit.   Subjective Assessment - 09/24/20 0935    Subjective Pt limps into PT with decreased weight bearing to R LE. The day before yesterday, for unknown reason, she felt like her back gave out on her and she had to hold onto things. She has been trying to not pick up her 33-year-old. It's been hard to do much of anything lately, and she has not returned to kickboxing.    Patient Stated Goals decrease pain    Currently in Pain? Yes    Pain Score 6     Pain Location Sacrum    Pain Orientation Right;Upper    Pain Descriptors / Indicators Discomfort    Pain Onset More than a month ago                             [facility] Adult PT Treatment/Exercise - 09/24/20 0001      Self-Care   Self-Care Other Self-Care Comments    Other Self-Care Comments  TrA firing, importance of practice at home      Lumbar Exercises: Seated   Sit to Stand 5 reps    Sit to Stand Limitations cues for TrA activation with exhale through  sit to stand and stand to sit   need of BUE due to discomfort in R, difficulty at bottom of motion (would benefit from high low table)     Lumbar Exercises: Supine   Ab Set 15 reps;5 seconds    AB Set Limitations palpation medial to B ASIS, flickering of L then able to fire B, but difficulty coordinating (exhale to fire, inhale to relax)   tendency to perform posterior pelvic tilt, emphasized maintaining spinal neutral, added ball b/t knees to enhance TrA fire   Pelvic Tilt 15 reps;5 seconds    Pelvic Tilt Limitations ball ADD with exhale and PPT      Lumbar Exercises: Prone   Other Prone Lumbar Exercises Tick Tocks x 5 AROM, x5 with gentle resistance   hip in neutral, knee bent to 90, maintaining pressure of pubic bone and B ASIS to mat, slowly IR/ER full ROM     Manual Therapy   Manual Therapy Joint mobilization;Soft tissue mobilization;Passive ROM;Taping;Muscle Energy Technique    Manual therapy comments prone    Joint Mobilization R sacral mobs grade 3    Soft tissue mobilization STM/XFM to R  upper glutes, piriformis, along R sacral border    Passive ROM B hip IR/ER    Muscle Energy Technique to corrent L anterior inominate    Kinesiotex Facilitate Muscle      Kinesiotix   Facilitate Muscle  X pattern SIJ to PSIS to facilitate lumbopelvic rhythm and decrease pain                  PT Education - 09/24/20 0944    Education Details reviewed HEP and emphasized pain free exercise with TrA activation    Person(s) Educated Patient    Methods Explanation;Tactile cues;Verbal cues    Comprehension Verbalized understanding;Need further instruction;Verbal cues required;Tactile cues required            PT Short Term Goals - 09/24/20 0927      PT SHORT TERM GOAL #1   Title pt will be able to tolerate sitting in a neutral position for at least 10 min    Baseline able to tolerate after treatment for 5 minutes    Time 4    Period Weeks    Status On-going    Target Date 09/03/20       PT SHORT TERM GOAL #2   Title pt will be independent in short term core stabilization program    Baseline pt has not been performing due to pain, reviewed today for form and reiterated which exercises to perform    Time 4    Period Weeks    Status On-going    Target Date 09/03/20      PT SHORT TERM GOAL #3   Title pt will demo equalized gait pattern to decrease abnormal pulls    Baseline trendelenburg in Rt stance phase with R LE toe touch weight bearing upon arrival, nearly normalized post treatment    Time 4    Period Weeks    Status On-going    Target Date 09/03/20             PT Long Term Goals - 09/24/20 0929      PT LONG TERM GOAL #1   Title Pt will be independent with long term HEP to prevent recurrence and address pain as needed.    Baseline provided initial HEP    Time 8    Period Weeks    Status New    Target Date 11/19/20      PT LONG TERM GOAL #2   Title Pt will tolerate sitting for 30 minutes or greater with no pain or issues performing sit to stand transfer after.    Baseline 5 minutes post tx    Time 8    Period Weeks    Status New    Target Date 11/19/20      PT LONG TERM GOAL #3   Title Pt will demonstrate TrA hold with extremity motion, in multiple positions (i.e. supine, prone, S/L, QP, sit, stand), and with functional transfers and mobility with no pain reported.    Baseline poor TrA timing in supine with flickering of L to R side and asymmetry of muscle contraction    Time 8    Period Weeks    Status New    Target Date 11/19/20      PT LONG TERM GOAL #4   Title Pt will be able to return to gym activity such as kick boxing pain free.    Baseline unable right now    Time 8    Period Weeks    Status  New    Target Date 11/19/20      PT LONG TERM GOAL #5   Title Pt will participate in all ADLs and IADLs, taking care of children with no issues.    Baseline difficulty picking up 16-year-old, pain    Time 8    Period Weeks    Status New     Target Date 11/19/20                 Plan - 09/24/20 0801    Clinical Impression Statement Pt presents after 1 month of PT with no change. She is in an exacerbation today with inflammation in R sacral region. Positive for SIJ testing with minimal ability to forward bend, (+) L Gillet's test in R SLS with poor L SIJ movement, and left anterior inominate today. Some change with MET to L hip in supine using hamstrings to posteriorly rotate L and hip flexors to anteriorly pull L hip forward, more success in supine with R sacral mobs. Pt demonstrates decreased R hip control in prone with tick tocks (IR/ER) and difficulty firing TrA symmetrically with poor coordination. She improved throughout treatment with manual therapy and gentle lumbopelvic stabilization, applying kinesiotape at the end of session from sacrum to opposite PSIS B.    PT Next Visit Plan check pelvic rotation, consider DN, core stabilization, TrA firing and endurance, TrA training throughout different positions and functional transfers/mobility; K tape as needed to sacrum and PSIS; patient wants to get back to running and kick boxing. If she continues to progress well, consider progressing to single leg stance activity.    PT Home Exercise Plan Hesh self correction for Lt post innom rotation, [email or web address]    Consulted and Agree with Plan of Care Patient           Patient will benefit from skilled therapeutic intervention in order to improve the following deficits and impairments:  Decreased range of motion,Increased muscle spasms,Decreased activity tolerance,Pain,Improper body mechanics,Postural dysfunction  Visit Diagnosis: Chronic bilateral low back pain without sciatica - Plan: PT plan of care cert/re-cert, CANCELED: PT plan of care cert/re-cert  Abnormal posture - Plan: PT plan of care cert/re-cert, CANCELED: PT plan of care cert/re-cert  Sacroiliac joint dysfunction - Plan: PT plan of care cert/re-cert, CANCELED: PT plan  of care cert/re-cert     Problem List Patient Active Problem List   Diagnosis Date Noted  . Urinary tract infection without hematuria 07/15/2020  . Urinary urgency 07/13/2020  . Dysuria 06/15/2020  . Back muscle spasm 06/15/2020  . Benign skin growth 03/04/2020  . Exercise-induced asthma 02/02/2020  . Overweight 11/15/2018  . Migraine 11/16/2015  . Depression, Moderate 05/13/2015  . Stress induced anxiety 05/04/2015  . Midline low back pain without sciatica 04/16/2013    Izell Albion, PT, DPT 09/24/2020, 9:53 AM  [facility] [address] [place], [place], [address] Phone: [phone]   Fax:  [phone]  Name: Alejandra Conway MRN: [id number] Date of Birth: 13-Apr-1986

## 2020-09-29 ENCOUNTER — Telehealth: Payer: Self-pay | Admitting: Physical Therapy

## 2020-09-29 ENCOUNTER — Ambulatory Visit: Payer: Medicaid Other | Admitting: Physical Therapy

## 2020-09-29 ENCOUNTER — Encounter: Payer: Self-pay | Admitting: Family Medicine

## 2020-09-29 NOTE — Telephone Encounter (Signed)
Attempted to contact patient regarding no show to appointment. Left next appointment date and time. Requested that she call prior to appointment if she needs to cancel or reschedule.

## 2020-10-01 ENCOUNTER — Other Ambulatory Visit: Payer: Self-pay

## 2020-10-01 ENCOUNTER — Ambulatory Visit: Payer: Medicaid Other | Admitting: Physical Therapy

## 2020-10-01 ENCOUNTER — Encounter: Payer: Self-pay | Admitting: Physical Therapy

## 2020-10-01 DIAGNOSIS — R293 Abnormal posture: Secondary | ICD-10-CM

## 2020-10-01 DIAGNOSIS — M533 Sacrococcygeal disorders, not elsewhere classified: Secondary | ICD-10-CM

## 2020-10-01 DIAGNOSIS — G8929 Other chronic pain: Secondary | ICD-10-CM

## 2020-10-01 DIAGNOSIS — M545 Low back pain, unspecified: Secondary | ICD-10-CM | POA: Diagnosis not present

## 2020-10-01 NOTE — Patient Instructions (Signed)

## 2020-10-01 NOTE — Therapy (Signed)
[place], [place], [address] Phone: [phone]   Fax:  [phone]  Physical Therapy Treatment  Patient Details  Name: Alejandra Conway MRN: [id number] Date of Birth: 10/26/1985 Referring Provider (PT): Madison Hickman, MD   Encounter Date: 10/01/2020   PT End of Session - 10/01/20 0836    Visit Number 5    Number of Visits 12    Date for PT Re-Evaluation 11/19/20    Authorization Type MCD- healthy blue-auth submitted 11/22, new auth submitted 09/28/20    PT Start Time 0802    PT Stop Time 0845    PT Time Calculation (min) 43 min           Past Medical History:  Diagnosis Date   Abnormal Pap smear    Asthma    Depression with anxiety 05/03/2015   Migraine     History reviewed. No pertinent surgical history.  There were no vitals filed for this visit.   Subjective Assessment - 10/01/20 0845    Subjective Pt limps into clinic and reports 5/10 pain. Limp improves after ambulating 10 feet. Pt reports she has not been able to do much for the last two weeks due to pain exacerbation. She also reports that the pain is affecting her mental health and she feels down.    Currently in Pain? Yes    Pain Score 5     Pain Location Sacrum    Pain Orientation Mid;Upper;Right    Pain Descriptors / Indicators Sharp;Aching    Pain Type Chronic pain    Aggravating Factors  transitions in bed and sit-stand    Pain Relieving Factors rest                             [facility] Adult PT Treatment/Exercise - 10/01/20 0001      Lumbar Exercises: Stretches   Piriformis Stretch Limitations 2 reps x 30 sec      Lumbar Exercises: Supine   Pelvic Tilt 15 reps;5 seconds    Pelvic Tilt Limitations ball ADD with exhale and PPT    Clam Limitations isometric clam into gait belt with Tra activation    Bridge Limitations can tolerate small ROM, less pain with PPT/Glute set    Other Supine Lumbar  Exercises ball squeeze with Tra activation      Modalities   Modalities Electrical Stimulation;Moist Heat      Moist Heat Therapy   Number Minutes Moist Heat 15 Minutes    Moist Heat Location Lumbar Spine   hooklying, legs on bolster     Electrical Stimulation   Electrical Stimulation Location Lower lumbar    Electrical Stimulation Action IFCx15 min    Electrical Stimulation Parameters 54mA    Electrical Stimulation Goals Pain      Manual Therapy   Muscle Energy Technique L posterior inominate correction manual and self correction                  PT Education - 10/01/20 0912    Education Details TENS Unit education and where to purchase    Person(s) Educated Patient    Methods Explanation;Handout    Comprehension Verbalized understanding            PT Short Term Goals - 09/24/20 0927      PT SHORT TERM GOAL #1   Title pt will be able to tolerate sitting in a neutral position for at least  10 min    Baseline able to tolerate after treatment for 5 minutes    Time 4    Period Weeks    Status On-going    Target Date 09/03/20      PT SHORT TERM GOAL #2   Title pt will be independent in short term core stabilization program    Baseline pt has not been performing due to pain, reviewed today for form and reiterated which exercises to perform    Time 4    Period Weeks    Status On-going    Target Date 09/03/20      PT SHORT TERM GOAL #3   Title pt will demo equalized gait pattern to decrease abnormal pulls    Baseline trendelenburg in Rt stance phase with R LE toe touch weight bearing upon arrival, nearly normalized post treatment    Time 4    Period Weeks    Status On-going    Target Date 09/03/20             PT Long Term Goals - 09/24/20 0929      PT LONG TERM GOAL #1   Title Pt will be independent with long term HEP to prevent recurrence and address pain as needed.    Baseline provided initial HEP    Time 8    Period Weeks    Status New    Target  Date 11/19/20      PT LONG TERM GOAL #2   Title Pt will tolerate sitting for 30 minutes or greater with no pain or issues performing sit to stand transfer after.    Baseline 5 minutes post tx    Time 8    Period Weeks    Status New    Target Date 11/19/20      PT LONG TERM GOAL #3   Title Pt will demonstrate TrA hold with extremity motion, in multiple positions (i.e. supine, prone, S/L, QP, sit, stand), and with functional transfers and mobility with no pain reported.    Baseline poor TrA timing in supine with flickering of L to R side and asymmetry of muscle contraction    Time 8    Period Weeks    Status New    Target Date 11/19/20      PT LONG TERM GOAL #4   Title Pt will be able to return to gym activity such as kick boxing pain free.    Baseline unable right now    Time 8    Period Weeks    Status New    Target Date 11/19/20      PT LONG TERM GOAL #5   Title Pt will participate in all ADLs and IADLs, taking care of children with no issues.    Baseline difficulty picking up 16-year-old, pain    Time 8    Period Weeks    Status New    Target Date 11/19/20                 Plan - 10/01/20 0857    Clinical Impression Statement Pt limps into clinic after standing from chair. She reports that she has been in pain for the last 2 weeks. She reports that the treatment last session was mildly helpful but overall her pain is the same and located central low back and spreads to the right SIJ. She demonstrates L posterior inominate rotation and we reviewed her self correction techniques. After multiple reps and checks she attained pelvic alignment  and noted decreased pain with bridge. Continued with gentle stabilization and discussed the need for consistent HEP to build strength and stabilization for her SIJs. Upon standing she reported the transition was less painful than on arrival. Trial Electrical stimulation with HMP as pt voiced the possibility of trying a TENS unit for home  use. After treatment she reported significant decrease in pain. She was given info on type of TENS and where to purchase.    PT Next Visit Plan assess benefit of IFC, has she been checking pelvic rotation prior to HEP?, consider DN, core stabilization, TrA firing and endurance, TrA training throughout different positions and functional transfers/mobility; K tape as needed to sacrum and PSIS; patient wants to get back to running and kick boxing. If she continues to progress well, consider progressing to single leg stance activity.    PT Home Exercise Plan Hesh self correction for Lt post innom rotation, [email or web address]           Patient will benefit from skilled therapeutic intervention in order to improve the following deficits and impairments:  Decreased range of motion,Increased muscle spasms,Decreased activity tolerance,Pain,Improper body mechanics,Postural dysfunction  Visit Diagnosis: Chronic bilateral low back pain without sciatica  Abnormal posture  Sacroiliac joint dysfunction     Problem List Patient Active Problem List   Diagnosis Date Noted   Urinary tract infection without hematuria 07/15/2020   Urinary urgency 07/13/2020   Dysuria 06/15/2020   Back muscle spasm 06/15/2020   Benign skin growth 03/04/2020   Exercise-induced asthma 02/02/2020   Overweight 11/15/2018   Migraine 11/16/2015   Depression, Moderate 05/13/2015   Stress induced anxiety 05/04/2015   Midline low back pain without sciatica 04/16/2013    Dorene Ar, PTA 10/01/2020, 9:53 AM  [facility] [address] [place], [place], [address] Phone: [phone]   Fax:  [phone]  Name: Alejandra Conway MRN: [id number] Date of Birth: 07-May-1986

## 2020-10-06 ENCOUNTER — Ambulatory Visit: Payer: Medicaid Other | Admitting: Physical Therapy

## 2020-10-06 ENCOUNTER — Encounter: Payer: Self-pay | Admitting: Physical Therapy

## 2020-10-06 ENCOUNTER — Telehealth: Payer: Self-pay | Admitting: Physical Therapy

## 2020-10-06 NOTE — Telephone Encounter (Signed)
Attempted to reach patient regarding no show to appointment today and last week. Left voicemail regarding attendance policy and the need to cancel her future appointments at this time. Requested that she call to schedule one appointment at a time at her convenience.

## 2020-10-08 ENCOUNTER — Ambulatory Visit: Payer: Medicaid Other | Admitting: Physical Therapy

## 2020-10-15 ENCOUNTER — Ambulatory Visit: Payer: Medicaid Other

## 2020-10-22 ENCOUNTER — Ambulatory Visit: Payer: Medicaid Other | Admitting: Physical Therapy

## 2020-10-29 ENCOUNTER — Encounter: Payer: Medicaid Other | Admitting: Physical Therapy

## 2020-11-05 ENCOUNTER — Encounter: Payer: Medicaid Other | Admitting: Physical Therapy

## 2022-02-14 ENCOUNTER — Encounter: Payer: Self-pay | Admitting: *Deleted

## 2022-07-10 NOTE — Progress Notes (Deleted)
    SUBJECTIVE:   CHIEF COMPLAINT / HPI:   Alejandra Conway is a 36 y.o. female who presents to the [facility] clinic today to discuss the following concerns:   Pap Smear Patient presents for routine Pap smear.  Last Pap smear was completed in February 2019 and was normal. No HPV testing was performed at that time due to age. Reports no abnormal Pap smears in the past. *** Interested in STI screening. ***  PERTINENT  PMH / PSH: None  OBJECTIVE:   There were no vitals taken for this visit. ***  General: NAD, pleasant, able to participate in exam Respiratory: normal effort GU: Normal appearance of labia majora and minora, without lesions. Vagina tissue pink, moist, without lesions or abrasions. Cervix normal appearance, non-friable, without discharge from os.  Psych: Normal affect and mood  GU exam chaperoned by *** CMA   ASSESSMENT/PLAN:   No problem-specific Assessment & Plan notes found for this encounter.     Sharion Settler, [place]

## 2022-07-12 NOTE — Patient Instructions (Incomplete)
It was wonderful to see you today.  Please bring ALL of your medications with you to every visit.   Today we talked about:  **  Thank you for coming to your visit as scheduled. We have had a large "no-show" problem lately, and this significantly limits our ability to see and care for patients. As a friendly reminder- if you cannot make your appointment please call to cancel. We do have a no show policy for those who do not cancel within 24 hours. Our policy is that if you miss or fail to cancel an appointment within 24 hours, 3 times in a 6-month period, you may be dismissed from our clinic.   Thank you for choosing [facility].   Please call [phone] with any questions about today's appointment.  Please be sure to schedule follow up at the front  desk before you leave today.   Izzac Rockett, DO PGY-3 Family Medicine   

## 2022-07-13 ENCOUNTER — Ambulatory Visit: Payer: Medicaid Other | Admitting: Family Medicine

## 2023-01-03 ENCOUNTER — Ambulatory Visit: Payer: Medicaid Other | Admitting: Family

## 2023-01-03 ENCOUNTER — Encounter: Payer: Self-pay | Admitting: Family

## 2023-01-03 VITALS — BP 133/89 | HR 85 | Temp 97.3°F | Ht 62.0 in | Wt 145.0 lb

## 2023-01-03 DIAGNOSIS — F419 Anxiety disorder, unspecified: Secondary | ICD-10-CM | POA: Diagnosis not present

## 2023-01-03 DIAGNOSIS — F32A Depression, unspecified: Secondary | ICD-10-CM | POA: Diagnosis not present

## 2023-01-03 DIAGNOSIS — J45990 Exercise induced bronchospasm: Secondary | ICD-10-CM | POA: Diagnosis not present

## 2023-01-03 DIAGNOSIS — R519 Headache, unspecified: Secondary | ICD-10-CM

## 2023-01-03 DIAGNOSIS — H9312 Tinnitus, left ear: Secondary | ICD-10-CM

## 2023-01-03 MED ORDER — VENLAFAXINE HCL ER 37.5 MG PO CP24: 37.5000 mg | ORAL_CAPSULE | Freq: Every day | ORAL | 0 refills | Status: DC

## 2023-01-03 MED ORDER — ALBUTEROL SULFATE HFA 108 (90 BASE) MCG/ACT IN AERS: 2.0000 | INHALATION_SPRAY | Freq: Four times a day (QID) | RESPIRATORY_TRACT | 3 refills | Status: AC | PRN

## 2023-01-03 NOTE — Patient Instructions (Addendum)
Welcome to [facility], It was a pleasure meeting you today!    As discussed, I have sent the generic Effexor to your pharmacy.  Continue to take the Excedrin migraine as needed for your  headaches. I am hoping the Effexor will help prevent the number of headaches you are having. Let me know if any side effects are not resolving after a week or 2.  Please schedule a 1  month follow up visit today this can be virtual if easier.    PLEASE NOTE: If you had any LAB tests please let us know if you have not heard back within a few days. You may see your results on MyChart before we have a chance to review them but we will give you a call once they are reviewed by [place]. If we ordered any REFERRALS today, please let us know if you have not heard from their office within the next week.  Let us know through MyChart if you are needing REFILLS, or have your pharmacy send [place] the request. You can also use MyChart to communicate with me or any office staff.  Please try these tips to maintain a healthy lifestyle: It is important that you exercise regularly at least 30 minutes 5 times a week. Think about what you will eat, plan ahead. Choose whole foods, & think  "clean, green, fresh or frozen" over canned, processed or packaged foods which are more sugary, salty, and fatty. 70 to 75% of food eaten should be fresh vegetables and protein. 2-3  meals daily with healthy snacks between meals, but must be whole fruit, protein or vegetables. Aim to eat over a 10 hour period when you are active, for example, 7am to 5pm, and then STOP after your last meal of the day, drinking only water.  Shorter eating windows, 6-8 hours, are showing benefits in heart disease and blood sugar regulation. Drink water every day! Shoot for 64 ounces daily = 8 cups, no other drink is as healthy! Fruit juice is best enjoyed in a healthy way, by EATING the fruit.

## 2023-01-03 NOTE — Progress Notes (Signed)
+  New Patient Office Visit  Subjective:  Patient ID: Alejandra Conway, female    DOB: [birth year]  Age: 37 y.o. MRN: [id number]  CC:  Chief Complaint  Patient presents with   New Patient (Initial Visit)   Migraine    Has tried Excedrin which does help, Migraines Almost everyday.   Asthma    Has tried albuterol in the past which does help.    Sinus Problem    sx for 3w    HPI Alejandra Conway presents for establishing care today.  Migraine:  has taken Topamax & Metoprolol in past, 2016-17; states meds had either SE or were not effective. States she has migraine twice a month. Has a headache about twice a week, irregardless of her menses. Takes Excedrin migraine which helps but takes frequently, more than once to get rid of headache. Reports Tylenol or OTC NSAIDs do not work. Does not remember using any RX migraine meds for acute.   Depression/Anxiety/fatigue: hx of Celexa in 2016-17; reports having therapy in past, but none in long time. Thinks the Celexa caused a lot of tiredness. Denies taking any other meds, really does not want to rely on meds.   Asthma:  exercise induced, uses rescue inhaler. sometimes high humidity and heat can be trigger. Pt uses Albuterol inhaler prn, has been out though for a few months due to no provider.  Sinus sx:  Pt c/o left ear fullness and ringing in bilateral ear for 3 weeks off and on. Has tried mucinex which did help slightly. Reports having some sinus sx prior to ear fullness, but those have resolved. Also reports a small nodule on left side of face at the edge of ear, non-tender. pt has not noticed any decrease in her hearing of left ear.  Assessment & Plan:  Anxiety and depression Assessment & Plan: chronic, stable last med was Celexa 20mg  has been off a few years, thinks it made her tired discussed med options today, pt also reports headache/migraine sx have restarted also sending Effexor 37.5mg  qam, advised on use & SE f/u via message  in 2 weeks if concerned w/SE, and 1 month regular f/u  Orders: -     Venlafaxine HCl ER; Take 1 capsule (37.5 mg total) by mouth daily with breakfast.  Dispense: 30 capsule; Refill: 0  Exercise-induced asthma Assessment & Plan: chronic, stable Albuterol rescue inhaler prn advised to let me know if using inhaler qd > 2w sending refill f/u prn  Orders: -     Albuterol Sulfate HFA; Inhale 2 puffs into the lungs every 6 (six) hours as needed for wheezing or shortness of breath.  Dispense: 18 g; Refill: 3  Tinnitus of left ear - bilateral ears clear, TM's wnl. sx most likely d/t either resolving sinus sx or migraine-like HA that are occurring more often. pt has pinpoint size fixed nodule/cyst on left face, next to top edge of ear, non-tender, not easily palpable, advised to monitor, reassured pt not r/t ear congestion or tinnitus.  Persistent headaches Assessment & Plan: chronic, unstable taking a lot of Excedrin migraine OTC starting Effexor for anxiety, has indication for headache prevention advised pt on preventing triggers, proper hydration limit use of Excedrin, take with 1 generic Aleve to potentiate effect f/u 1 month   Subjective:    Outpatient Medications Prior to Visit  Medication Sig Dispense Refill   albuterol (VENTOLIN HFA) 108 (90 Base) MCG/ACT inhaler Inhale 2 puffs into the lungs every 6 (six) hours as needed for  wheezing or shortness of breath. 18 g 3   citalopram (CELEXA) 20 MG tablet Take 1 tablet (20 mg total) by mouth daily. 90 tablet 3   Multiple Vitamin (MULTIVITAMIN WITH MINERALS) TABS tablet Take 1 tablet by mouth daily. (Patient not taking: Reported on 08/02/2020)     Riboflavin 400 MG TABS Take 1 tablet daily by mouth 90 tablet 3   No facility-administered medications prior to visit.   Past Medical History:  Diagnosis Date   Abnormal Pap smear    Asthma    Back muscle spasm 06/15/2020   Depression with anxiety 05/03/2015   Depression, Moderate  05/13/2015   Depression & Anxiety; does not want to discuss w/ husband in room   Dysuria 06/15/2020   Migraine    Overweight 11/15/2018   Stress induced anxiety 05/04/2015   Depression & Anxiety; does not want to discuss w/ husband in room   Urinary urgency 07/13/2020   History reviewed. No pertinent surgical history.  Objective:   Today's Vitals: BP 133/89 (BP Location: Left Arm, Patient Position: Sitting, Cuff Size: Normal)   Pulse 85   Temp (!) 97.3 F (36.3 C) (Temporal)   Ht 5\' 2"  (1.575 m)   Wt 145 lb (65.8 kg)   LMP  (LMP Unknown)   SpO2 99%   BMI 26.52 kg/m   Physical Exam Vitals and nursing note reviewed.  Constitutional:      Appearance: Normal appearance.  HENT:     Right Ear: Tympanic membrane and ear canal normal.     Left Ear: Tympanic membrane and ear canal normal.     Mouth/Throat:     Mouth: Mucous membranes are moist.     Pharynx: No pharyngeal swelling, oropharyngeal exudate, posterior oropharyngeal erythema or uvula swelling.  Cardiovascular:     Rate and Rhythm: Normal rate and regular rhythm.  Pulmonary:     Effort: Pulmonary effort is normal.     Breath sounds: Normal breath sounds.  Musculoskeletal:        General: Normal range of motion.  Lymphadenopathy:     Head:     Right side of head: No submental, submandibular, tonsillar, preauricular, posterior auricular or occipital adenopathy.     Left side of head: No submental, submandibular, tonsillar, preauricular, posterior auricular or occipital adenopathy.     Cervical: No cervical adenopathy.  Skin:    General: Skin is warm and dry.  Neurological:     Mental Status: She is alert.  Psychiatric:        Mood and Affect: Mood normal.        Behavior: Behavior normal.    Dulce Sellar, NP

## 2023-01-06 ENCOUNTER — Encounter: Payer: Self-pay | Admitting: Family

## 2023-01-06 DIAGNOSIS — R519 Headache, unspecified: Secondary | ICD-10-CM | POA: Insufficient documentation

## 2023-01-06 NOTE — Assessment & Plan Note (Signed)
chronic, stable Albuterol rescue inhaler prn advised to let me know if using inhaler qd > 2w sending refill f/u prn

## 2023-01-06 NOTE — Assessment & Plan Note (Signed)
chronic, unstable taking a lot of Excedrin migraine OTC starting Effexor for anxiety, has indication for headache prevention advised pt on preventing triggers, proper hydration limit use of Excedrin, take with 1 generic Aleve to potentiate effect f/u 1 month

## 2023-01-06 NOTE — Assessment & Plan Note (Deleted)
hx of Topamax

## 2023-01-06 NOTE — Assessment & Plan Note (Addendum)
chronic, stable last med was Celexa 20mg  has been off a few years, thinks it made her tired discussed med options today, pt also reports headache/migraine sx have restarted also sending Effexor 37.5mg  qam, advised on use & SE f/u via message in 2 weeks if concerned w/SE, and 1 month regular f/u

## 2023-01-25 ENCOUNTER — Ambulatory Visit (INDEPENDENT_AMBULATORY_CARE_PROVIDER_SITE_OTHER): Payer: Medicaid Other | Admitting: Nurse Practitioner

## 2023-01-25 ENCOUNTER — Encounter: Payer: Self-pay | Admitting: Nurse Practitioner

## 2023-01-25 ENCOUNTER — Other Ambulatory Visit (HOSPITAL_COMMUNITY)
Admission: RE | Admit: 2023-01-25 | Discharge: 2023-01-25 | Disposition: A | Discharge status: Home / Self Care | Payer: Medicaid Other | Source: Ambulatory Visit | Attending: Nurse Practitioner | Admitting: Nurse Practitioner

## 2023-01-25 VITALS — BP 124/74 | Ht 61.75 in | Wt 142.0 lb

## 2023-01-25 DIAGNOSIS — Z124 Encounter for screening for malignant neoplasm of cervix: Secondary | ICD-10-CM | POA: Insufficient documentation

## 2023-01-25 DIAGNOSIS — N9412 Deep dyspareunia: Secondary | ICD-10-CM

## 2023-01-25 DIAGNOSIS — R1031 Right lower quadrant pain: Secondary | ICD-10-CM

## 2023-01-25 DIAGNOSIS — Z01419 Encounter for gynecological examination (general) (routine) without abnormal findings: Secondary | ICD-10-CM

## 2023-01-25 NOTE — Progress Notes (Signed)
   [place] 09-Aug-1986 [id number]   History:  37 y.o. [id number] presents as new patient to establish care. Monthly cycles. Normal pap history. Complains of painful intercourse x 1 year. Pain is deep, does not occur every time, is always on the right side, achy, sometimes lingers after intercourse, and does not seem to be positional. No bleeding with intercourse. Same partner x 3 years. Does have degenerative disc disease and has been experiencing back pain. Had some improvement with chiropractor. Anxiety and depression managed by PCP.   Gynecologic History Patient's last menstrual period was 01/07/2023 (approximate). Period Cycle (Days): 28 Period Duration (Days): 5 Period Pattern: Regular Menstrual Flow: Heavy Menstrual Control: Tampon Dysmenorrhea: (!) Moderate Dysmenorrhea Symptoms: Cramping Contraception/Family planning: vasectomy Sexually active: Yes, declines STD screening  Health Maintenance Last Pap: 10/22/2017. Results were: Normal Last mammogram: Not indicated Last colonoscopy: Not indicated Last Dexa: Not indicated  Past medical history, past surgical history, family history and social history were all reviewed and documented in the EPIC chart. Engaged. Paralegal. 3 sons ages 3, 50 and 61. Mother with history of thyroid cancer and ovarian cancer (~age 70).   ROS:  A ROS was performed and pertinent positives and negatives are included.  Exam:  Vitals:   01/25/23 1142  BP: 124/74  Weight: 142 lb (64.4 kg)  Height: 5' 1.75" (1.568 m)   Body mass index is 26.18 kg/m.  General appearance:  Normal Thyroid:  Symmetrical, normal in size, without palpable masses or nodularity. Respiratory  Auscultation:  Clear without wheezing or rhonchi Cardiovascular  Auscultation:  Regular rate, without rubs, murmurs or gallops  Edema/varicosities:  Not grossly evident Abdominal  Soft,nontender, without masses, guarding or rebound.  Liver/spleen:  No organomegaly  noted  Hernia:  None appreciated  Skin  Inspection:  Grossly normal Breasts: Examined lying and sitting.   Right: Without masses, retractions, nipple discharge or axillary adenopathy.   Left: Without masses, retractions, nipple discharge or axillary adenopathy. Genitourinary   Inguinal/mons:  Normal without inguinal adenopathy  External genitalia:  Normal appearing vulva with no masses, tenderness, or lesions  BUS/Urethra/Skene's glands:  Normal  Vagina:  Normal appearing with normal color and discharge, no lesions  Cervix:  Normal appearing without discharge or lesions  Uterus:  Normal in size, shape and contour.  Midline and mobile, nontender  Adnexa/parametria:     Rt: Normal in size, without masses or tenderness.   Lt: Normal in size, without masses or tenderness.  Anus and perineum: Normal  Digital rectal exam: Deferred  Patient informed chaperone available to be present for breast and pelvic exam. Patient has requested no chaperone to be present. Patient has been advised what will be completed during breast and pelvic exam.   Assessment/Plan:  37 y.o. [id number] to establish care.   Well female exam with routine gynecological exam - Education provided on SBEs, importance of preventative screenings, current guidelines, high calcium diet, regular exercise, and multivitamin daily.   Screening for cervical cancer - Plan: Cytology - PAP( [facility]). Normal pap history.   Deep dyspareunia - Plan: US PELVIS TRANSVAGINAL NON-OB (TV ONLY). Will schedule pelvic ultrasound. Does have chronic back pain and evidence of pelvic floor weakness. Recommend returning to chiropractor since she had improvement and discussed option for pelvic floor PT.   Right lower quadrant abdominal pain - Plan: US PELVIS TRANSVAGINAL NON-OB (TV ONLY)  Return in 1 year for annual.     Olivia Mackie DNP, 12:30 PM 01/25/2023

## 2023-01-31 LAB — CYTOLOGY - PAP
Chlamydia: NEGATIVE
Comment: NEGATIVE
Comment: NEGATIVE
Comment: NORMAL
High risk HPV: POSITIVE — AB
Neisseria Gonorrhea: NEGATIVE

## 2023-02-01 ENCOUNTER — Other Ambulatory Visit: Payer: Self-pay | Admitting: *Deleted

## 2023-02-01 DIAGNOSIS — B977 Papillomavirus as the cause of diseases classified elsewhere: Secondary | ICD-10-CM

## 2023-02-01 DIAGNOSIS — R87612 Low grade squamous intraepithelial lesion on cytologic smear of cervix (LGSIL): Secondary | ICD-10-CM

## 2023-03-05 NOTE — Progress Notes (Deleted)
GYNECOLOGY  VISIT   HPI: 37 y.o.   Divorced  Caucasian  female   [phone] with No LMP recorded.   here for   colpo  GYNECOLOGIC HISTORY: No LMP recorded. Contraception:  vasectomy Menopausal hormone therapy:  n/a Last mammogram:  n/a Last pap smear:   01/25/23 LSIL: HR HPV positive        OB History     Gravida  4   Para  3   Term  3   Preterm  0   AB  1   Living  3      SAB  0   IAB  1   Ectopic  0   Multiple  0   Live Births  3              Patient Active Problem List   Diagnosis Date Noted   Persistent headaches 01/06/2023   Exercise-induced asthma 02/02/2020   Anxiety and depression 02/01/2018    Past Medical History:  Diagnosis Date   Abnormal Pap smear    Asthma    Back muscle spasm 06/15/2020   Benign skin growth 03/04/2020   Depression with anxiety 05/03/2015   Depression, Moderate 05/13/2015   Depression & Anxiety; does not want to discuss w/ husband in room   Dysuria 06/15/2020   Midline low back pain without sciatica 04/16/2013   Migraine    Overweight 11/15/2018   Stress induced anxiety 05/04/2015   Depression & Anxiety; does not want to discuss w/ husband in room   Urinary tract infection without hematuria 07/15/2020   Urinary urgency 07/13/2020    No past surgical history on file.  Current Outpatient Medications  Medication Sig Dispense Refill   albuterol (VENTOLIN HFA) 108 (90 Base) MCG/ACT inhaler Inhale 2 puffs into the lungs every 6 (six) hours as needed for wheezing or shortness of breath. 18 g 3   aspirin-acetaminophen-caffeine (EXCEDRIN MIGRAINE) 250-250-65 MG tablet Take by mouth every 6 (six) hours as needed for headache.     venlafaxine XR (EFFEXOR XR) 37.5 MG 24 hr capsule Take 1 capsule (37.5 mg total) by mouth daily with breakfast. (Patient not taking: Reported on 01/25/2023) 30 capsule 0   No current facility-administered medications for this visit.     ALLERGIES: Pseudoephedrine  Family History  Problem  Relation Age of Onset   Heart disease Mother    Ovarian cancer Mother    Thyroid cancer Mother    Heart disease Paternal Uncle    Cancer Maternal Grandfather    Alkaptonuria Son     Social History   Socioeconomic History   Marital status: Divorced    Spouse name: Not on file   Number of children: 3   Years of education: Not on file   Highest education level: Not on file  Occupational History   Not on file  Tobacco Use   Smoking status: Never    Passive exposure: Never   Smokeless tobacco: Never  Vaping Use   Vaping Use: Not on file  Substance and Sexual Activity   Alcohol use: Yes    Comment: rare   Drug use: No   Sexual activity: Yes    Partners: Male    Comment: vasectomy-fiance, menarche 37yo, sexual debut 37yo  Other Topics Concern   Not on file  Social History Narrative   Not on file   Social Determinants of Health   Financial Resource Strain: Not on file  Food Insecurity: Not on file  Transportation Needs: Not  on file  Physical Activity: Not on file  Stress: Not on file  Social Connections: Not on file  Intimate Partner Violence: Not on file    Review of Systems  PHYSICAL EXAMINATION:    There were no vitals taken for this visit.    General appearance: alert, cooperative and appears stated age Head: Normocephalic, without obvious abnormality, atraumatic Neck: no adenopathy, supple, symmetrical, trachea midline and thyroid normal to inspection and palpation Lungs: clear to auscultation bilaterally Breasts: normal appearance, no masses or tenderness, No nipple retraction or dimpling, No nipple discharge or bleeding, No axillary or supraclavicular adenopathy Heart: regular rate and rhythm Abdomen: soft, non-tender, no masses,  no organomegaly Extremities: extremities normal, atraumatic, no cyanosis or edema Skin: Skin color, texture, turgor normal. No rashes or lesions Lymph nodes: Cervical, supraclavicular, and axillary nodes normal. No abnormal  inguinal nodes palpated Neurologic: Grossly normal  Pelvic: External genitalia:  no lesions              Urethra:  normal appearing urethra with no masses, tenderness or lesions              Bartholins and Skenes: normal                 Vagina: normal appearing vagina with normal color and discharge, no lesions              Cervix: no lesions                Bimanual Exam:  Uterus:  normal size, contour, position, consistency, mobility, non-tender              Adnexa: no mass, fullness, tenderness              Rectal exam: {yes no:[id number]}.  Confirms.              Anus:  normal sphincter tone, no lesions  Chaperone was present for exam:  ***  ASSESSMENT     PLAN     An After Visit Summary was printed and given to the patient.  ______ minutes face to face time of which over 50% was spent in counseling.

## 2023-03-19 ENCOUNTER — Ambulatory Visit: Payer: Medicaid Other | Admitting: Obstetrics and Gynecology

## 2023-03-29 ENCOUNTER — Encounter: Payer: Self-pay | Admitting: Obstetrics and Gynecology

## 2023-03-29 ENCOUNTER — Ambulatory Visit (INDEPENDENT_AMBULATORY_CARE_PROVIDER_SITE_OTHER): Payer: Medicaid Other | Admitting: Obstetrics and Gynecology

## 2023-03-29 ENCOUNTER — Other Ambulatory Visit: Payer: Medicaid Other

## 2023-03-29 ENCOUNTER — Other Ambulatory Visit: Payer: Medicaid Other | Admitting: Nurse Practitioner

## 2023-03-29 ENCOUNTER — Other Ambulatory Visit (HOSPITAL_COMMUNITY)
Admission: RE | Admit: 2023-03-29 | Discharge: 2023-03-29 | Disposition: A | Discharge status: Home / Self Care | Payer: Medicaid Other | Source: Ambulatory Visit | Attending: Obstetrics and Gynecology | Admitting: Obstetrics and Gynecology

## 2023-03-29 VITALS — BP 128/80 | HR 71 | Wt 144.0 lb

## 2023-03-29 DIAGNOSIS — D069 Carcinoma in situ of cervix, unspecified: Secondary | ICD-10-CM | POA: Diagnosis not present

## 2023-03-29 DIAGNOSIS — Z01812 Encounter for preprocedural laboratory examination: Secondary | ICD-10-CM | POA: Diagnosis not present

## 2023-03-29 DIAGNOSIS — R87810 Cervical high risk human papillomavirus (HPV) DNA test positive: Secondary | ICD-10-CM

## 2023-03-29 DIAGNOSIS — R87612 Low grade squamous intraepithelial lesion on cytologic smear of cervix (LGSIL): Secondary | ICD-10-CM | POA: Insufficient documentation

## 2023-03-29 LAB — PREGNANCY, URINE: Preg Test, Ur: NEGATIVE

## 2023-03-29 NOTE — Progress Notes (Unsigned)
GYNECOLOGY  VISIT   HPI: 37 y.o.   Divorced  Caucasian  female   [phone] with Patient's last menstrual period was 03/02/2023.   here for   colposcopy.  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   UPT negative today.  Has pelvic US scheduled for 04/12/23.   Patient is engaged.  GYNECOLOGIC HISTORY: Patient's last menstrual period was 03/02/2023. Contraception:  vasectomy Menopausal hormone therapy:  n/a Last mammogram:  n/a Last pap smear:   01/25/23 LSIL: HR HPV positive, 10/22/17 neg        OB History     Gravida  4   Para  3   Term  3   Preterm  0   AB  1   Living  3      SAB  0   IAB  1   Ectopic  0   Multiple  0   Live Births  3              Patient Active Problem List   Diagnosis Date Noted   Persistent headaches 01/06/2023   Exercise-induced asthma 02/02/2020   Anxiety and depression 02/01/2018    Past Medical History:  Diagnosis Date   Abnormal Pap smear    Asthma    Back muscle spasm 06/15/2020   Benign skin growth 03/04/2020   Depression with anxiety 05/03/2015   Depression, Moderate 05/13/2015   Depression & Anxiety; does not want to discuss w/ husband in room   Dysuria 06/15/2020   Midline low back pain without sciatica 04/16/2013   Migraine    Overweight 11/15/2018   Stress induced anxiety 05/04/2015   Depression & Anxiety; does not want to discuss w/ husband in room   Urinary tract infection without hematuria 07/15/2020   Urinary urgency 07/13/2020    History reviewed. No pertinent surgical history.  Current Outpatient Medications  Medication Sig Dispense Refill   albuterol (VENTOLIN HFA) 108 (90 Base) MCG/ACT inhaler Inhale 2 puffs into the lungs every 6 (six) hours as needed for wheezing or shortness of breath. 18 g 3   aspirin-acetaminophen-caffeine (EXCEDRIN MIGRAINE) 250-250-65 MG tablet Take by mouth every 6 (six) hours as needed for headache.     No current facility-administered medications for this visit.      ALLERGIES: Pseudoephedrine  Family History  Problem Relation Age of Onset   Heart disease Mother    Ovarian cancer Mother    Thyroid cancer Mother    Heart disease Paternal Uncle    Cancer Maternal Grandfather    Alkaptonuria Son     Social History   Socioeconomic History   Marital status: Divorced    Spouse name: Not on file   Number of children: 3   Years of education: Not on file   Highest education level: Not on file  Occupational History   Not on file  Tobacco Use   Smoking status: Never    Passive exposure: Never   Smokeless tobacco: Never  Vaping Use   Vaping status: Not on file  Substance and Sexual Activity   Alcohol use: Yes    Comment: rare   Drug use: No   Sexual activity: Yes    Partners: Male    Comment: vasectomy-fiance, menarche 37yo, sexual debut 37yo  Other Topics Concern   Not on file  Social History Narrative   Not on file   Social Determinants of Health   Financial Resource Strain: Not on file  Food Insecurity: Not on file  Transportation Needs: Not on file  Physical Activity: Not on file  Stress: Not on file  Social Connections: Not on file  Intimate Partner Violence: Not on file    Review of Systems  All other systems reviewed and are negative.   PHYSICAL EXAMINATION:    BP 128/80 (BP Location: Right Arm, Patient Position: Sitting, Cuff Size: Normal)   Pulse 71   Wt 144 lb (65.3 kg)   LMP 03/02/2023   SpO2 99%   BMI 26.55 kg/m     General appearance: alert, cooperative and appears stated age                  Colposcopy - cervix, vagina. Consent for procedure.  3% acetic acid used in vagina. White light and green light filter used.  Colposcopy satisfactory:  Yes   __x___          No    _____ Findings:    Cervix:  large ectropion.  Thickened acetowhite change at 11:00 outside  transformation zone.  Acetowhite change at 2:00 in transformation zone.  Vagina:  no lesions. Biopsies:  ECC, biopsy at 11:00, biopsy at 2:00.   Monsel's placed.  Minimal EBL. No complications.   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    PLAN  FU biopsies.  Discussed Gardasil.    An After Visit Summary was printed and given to the patient.  ______ minutes face to face time of which over 50% was spent in counseling.

## 2023-03-29 NOTE — Patient Instructions (Signed)
Colposcopy, Care After  The following information offers guidance on how to care for yourself after your procedure. Your health care provider may also give you more specific instructions. If you have problems or questions, contact your health care provider. What can I expect after the procedure? If you had a colposcopy without a biopsy, you can expect to feel fine right away after your procedure. However, you may have some spotting of blood for a few days. You can return to your normal activities. If you had a colposcopy with a biopsy, it is common after the procedure to have: Soreness and mild pain. These may last for a few days. Mild vaginal bleeding or discharge that is dark-colored and grainy. This may last for a few days. The discharge may be caused by a liquid (solution) that was used during the procedure. You may need to wear a sanitary pad during this time. Spotting of blood for at least 48 hours after the procedure. Follow these instructions at home: Medicines Take over-the-counter and prescription medicines only as told by your health care provider. Talk with your health care provider about what type of over-the-counter pain medicines and prescription medicines you can start to take again. It is especially important to talk with your health care provider if you take blood thinners. Activity Avoid using douche products, using tampons, and having sex for at least 3 days after the procedure or for as long as told by your health care provider. Return to your normal activities as told by your health care provider. Ask your health care provider what activities are safe for you. General instructions Ask your health care provider if you may take baths, swim, or use a hot tub. You may take showers. If you use birth control (contraception), continue to use it. Keep all follow-up visits. This is important. Contact a health care provider if: You have a fever or chills. You faint or feel  light-headed. Get help right away if: You have heavy bleeding from your vagina or pass blood clots. Heavy bleeding is bleeding that soaks through a sanitary pad in less than 1 hour. You have vaginal discharge that is abnormal, is yellow in color, or smells bad. This could be a sign of infection. You have severe pain or cramps in your lower abdomen that do not go away with medicine. Summary If you had a colposcopy without a biopsy, you can expect to feel fine right away, but you may have some spotting of blood for a few days. You can return to your normal activities. If you had a colposcopy with a biopsy, it is common to have mild pain for a few days and spotting for 48 hours after the procedure. Avoid using douche products, using tampons, and having sex for at least 3 days after the procedure or for as long as told by your health care provider. Get help right away if you have heavy bleeding, severe pain, or signs of infection. This information is not intended to replace advice given to you by your health care provider. Make sure you discuss any questions you have with your health care provider. Document Revised: 01/23/2021 Document Reviewed: 01/23/2021 Elsevier Patient Education  2024 Elsevier Inc.  

## 2023-04-02 LAB — SURGICAL PATHOLOGY

## 2023-04-03 ENCOUNTER — Other Ambulatory Visit: Payer: Self-pay

## 2023-04-03 DIAGNOSIS — N871 Moderate cervical dysplasia: Secondary | ICD-10-CM

## 2023-04-06 NOTE — Progress Notes (Signed)
GYNECOLOGY  VISIT   HPI: 37 y.o.   Divorced  Caucasian  female   [phone] with Patient's last menstrual period was 03/29/2023.   here for   LEEP.  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 Her pap 01/25/23 was LGSIL, possible HGSIL, positive HR HPV.  UPT today:  negative.  GYNECOLOGIC HISTORY: Patient's last menstrual period was 03/29/2023. 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/02/2020   Anxiety and depression 02/01/2018    Past Medical History:  Diagnosis Date   Abnormal Pap smear    Asthma    Back muscle spasm 06/15/2020   Benign skin growth 03/04/2020   Depression with anxiety 05/03/2015   Depression, Moderate 05/13/2015   Depression & Anxiety; does not want to discuss w/ husband in room   Dysuria 06/15/2020   Midline low back pain without sciatica 04/16/2013   Migraine    Overweight 11/15/2018   Stress induced anxiety 05/04/2015   Depression & Anxiety; does not want to discuss w/ husband in room   Urinary tract infection without hematuria 07/15/2020   Urinary urgency 07/13/2020    History reviewed. No pertinent surgical history.  Current Outpatient Medications  Medication Sig Dispense Refill   albuterol (VENTOLIN HFA) 108 (90 Base) MCG/ACT inhaler Inhale 2 puffs into the lungs every 6 (six) hours as needed for wheezing or shortness of breath. 18 g 3   aspirin-acetaminophen-caffeine (EXCEDRIN MIGRAINE) 250-250-65 MG tablet Take by mouth every  6 (six) hours as needed for headache.     No current facility-administered medications for this visit.     ALLERGIES: Pseudoephedrine  Family History  Problem Relation Age of Onset   Heart disease Mother    Ovarian cancer Mother    Thyroid cancer Mother    Heart disease Paternal Uncle    Cancer Maternal Grandfather    Alkaptonuria Son     Social History   Socioeconomic History   Marital status: Divorced    Spouse name: Not on file   Number of children: 3   Years of education: Not on file   Highest education level: Not on file  Occupational History   Not on file  Tobacco Use   Smoking status: Never    Passive exposure: Never   Smokeless tobacco: Never  Vaping Use   Vaping status: Not on file  Substance and Sexual Activity   Alcohol use: Yes    Comment: rare   Drug use: No   Sexual activity: Yes    Partners: Male    Comment: vasectomy-fiance, menarche 37yo, sexual debut 37yo  Other Topics Concern   Not on file  Social History Narrative   Not on file   Social Determinants of  Health   Financial Resource Strain: Not on file  Food Insecurity: Not on file  Transportation Needs: Not on file  Physical Activity: Not on file  Stress: Not on file  Social Connections: Not on file  Intimate Partner Violence: Not on file    Review of Systems  All other systems reviewed and are negative.   PHYSICAL EXAMINATION:    BP 122/82 (BP Location: Right Arm, Patient Position: Sitting, Cuff Size: Normal)   Pulse 71   Ht 5' 1.75" (1.568 m)   Wt 143 lb (64.9 kg)   LMP 03/29/2023   SpO2 98%   BMI 26.37 kg/m     General appearance: alert, cooperative and appears stated age   Procedure - colposcopy with LEEP - abandoned. 3% acetic acid applied to the cervix and acetowhite areas noted circumferentially around the os.   Lugol's solution applied.  Decreased uptake noted circumferentially.  Local 1% lidocaine with epinephrine 100,000 - 20 cc injected circumferentially in the  cervix.  Patient then immediately developed strong headache and procedure was aborted.   BP 148/100, pulse 90.  BP 138/82, pulse 67. Patient given Motrin 800 mg.  Pain scale reduced to 5/10, which is a usual pain level for her migraines.   Chaperone was present for exam:  Warren Lacy, CMA  ASSESSMENT  CIN II.  Migraine headache post injection with lidocaine 1% with epinephrine 100,000. Hx migraine HAs.   PLAN  I recommend LEEP in outpatient surgical setting.  Her partner has come to the office to pick her up and take her home to rest.  Office will contact her for surgical consultation.

## 2023-04-11 NOTE — Progress Notes (Signed)
   Acute Office Visit  Subjective:    Patient ID: Alejandra Conway, female    DOB: [birth year], 37 y.o.   MRN: [id number]   HPI 37 y.o. presents today for ultrasound. Seen as new patient 01/25/23 with complaints of painful intercourse x 1 year. Pain is deep, does not occur every time, is always on the right side, achy, sometimes lingers after intercourse, and does not seem to be positional. No bleeding with intercourse. Same partner x 3 years. Does have degenerative disc disease and has been experiencing back pain. Had some improvement with chiropractor.   Patient's last menstrual period was 03/29/2023.    Review of Systems  Constitutional: Negative.   Genitourinary:  Positive for dyspareunia and pelvic pain.       Objective:    Physical Exam Constitutional:      Appearance: Normal appearance.   GU: Not indicated  BP 110/70   Pulse 68   Wt 143 lb (64.9 kg)   LMP 03/29/2023   SpO2 100%   BMI 26.37 kg/m  Wt Readings from Last 3 Encounters:  04/12/23 143 lb (64.9 kg)  03/29/23 144 lb (65.3 kg)  01/25/23 142 lb (64.4 kg)        Assessment & Plan:   Problem List Items Addressed This Visit   None Visit Diagnoses     Deep dyspareunia    -  Primary   Right lower quadrant abdominal pain          Vaginal ultrasound: Anteverted uterus, normal size and shape, no myometrial masses.  Symmetrical endometrium - 5.4 mm.  No masses or thickening seen.  Both ovaries mobile, normal size with normal follicle pattern and normal perfusion.  No adnexal masses, no free fluid.  Plan: Normal ultrasound findings reviewed with patient. Possible musculoskeletal versus intermittent functional cysts since pain is intermittent. Option for low dose birth control pill to see if this helps with possible cyst suppression. Wants to monitor and will consider OCPs. Recommend continuing visits with chiropractor to see if this helps.      Olivia Mackie DNP, 12:17 PM 04/12/2023

## 2023-04-12 ENCOUNTER — Encounter: Payer: Self-pay | Admitting: Nurse Practitioner

## 2023-04-12 ENCOUNTER — Ambulatory Visit (INDEPENDENT_AMBULATORY_CARE_PROVIDER_SITE_OTHER): Payer: Medicaid Other

## 2023-04-12 ENCOUNTER — Ambulatory Visit: Payer: Medicaid Other | Admitting: Nurse Practitioner

## 2023-04-12 VITALS — BP 110/70 | HR 68 | Wt 143.0 lb

## 2023-04-12 DIAGNOSIS — N9412 Deep dyspareunia: Secondary | ICD-10-CM | POA: Diagnosis not present

## 2023-04-12 DIAGNOSIS — R1031 Right lower quadrant pain: Secondary | ICD-10-CM

## 2023-04-19 ENCOUNTER — Encounter: Payer: Self-pay | Admitting: Obstetrics and Gynecology

## 2023-04-19 ENCOUNTER — Ambulatory Visit: Payer: Medicaid Other | Admitting: Obstetrics and Gynecology

## 2023-04-19 VITALS — BP 122/82 | HR 71 | Ht 61.75 in | Wt 143.0 lb

## 2023-04-19 DIAGNOSIS — N871 Moderate cervical dysplasia: Secondary | ICD-10-CM

## 2023-04-19 DIAGNOSIS — Z01812 Encounter for preprocedural laboratory examination: Secondary | ICD-10-CM

## 2023-04-19 DIAGNOSIS — R519 Headache, unspecified: Secondary | ICD-10-CM

## 2023-04-19 LAB — PREGNANCY, URINE: Preg Test, Ur: NEGATIVE

## 2023-04-23 ENCOUNTER — Telehealth: Payer: Self-pay | Admitting: Obstetrics and Gynecology

## 2023-04-23 DIAGNOSIS — R519 Headache, unspecified: Secondary | ICD-10-CM

## 2023-04-23 NOTE — Telephone Encounter (Signed)
Please assist in making an appointment for patient to see Dr. Karma Greaser for a colposcopy with LEEP conization in an outpatient surgical setting.

## 2023-04-29 NOTE — Telephone Encounter (Signed)
Please schedule preop appoint with Dr. Karma Greaser for treatment of cervical dysplasia in outpatient surgical setting for CIN II.

## 2023-04-30 NOTE — Telephone Encounter (Signed)
[name] - I have this patient in my in-basket. I was holding for further clarification on Dr. Bonney Roussel surgery schedule to schedule her pre-op within 30 days of surgery. Does she need an additional appt prior to that?

## 2023-05-01 NOTE — Telephone Encounter (Signed)
Spoke with patient.  Advised per [name]. OV scheduled with Dr. Karma Greaser on 9/12 at 1000 for consult.   Patient states she has not seen a neurologist in the past, agreeable to referral. Order placed to [facility], advised their office will contact her directly to schedule.   Questions answered. Patient verbalizes understanding and is agreeable.   Routing to provider for final review. Patient is agreeable to disposition. Will close encounter.  Cc: Bianca for F/u on referral

## 2023-05-01 NOTE — Telephone Encounter (Signed)
The referral to neurology has appointment requested within 2 weeks.   Routing to [employer].

## 2023-05-01 NOTE — Telephone Encounter (Signed)
I recommend proceeding with the appointment with Dr. Karma Greaser.   The patient had a significant headache with the administration of the local lidocaine with epinephrine during her procedure which was an intended LEEP.  This is not a commonly seen side effect.  There was no obvious intravascular injection of the local given to her cervix.   Upon reflection, it may be helpful for the patient to have a neurology consultation regarding her headaches.   I do not know if she has previously had evaluation for this.  I would be happy to make a referral.

## 2023-05-01 NOTE — Telephone Encounter (Signed)
I would like to have the patient seen within the next 2 - 3 weeks if possible.  This will help in surgical planning.

## 2023-05-08 ENCOUNTER — Ambulatory Visit: Payer: Medicaid Other | Admitting: Neurology

## 2023-05-08 ENCOUNTER — Encounter: Payer: Self-pay | Admitting: Neurology

## 2023-05-08 VITALS — BP 127/83 | HR 93 | Ht 62.0 in | Wt 146.0 lb

## 2023-05-08 DIAGNOSIS — F32A Depression, unspecified: Secondary | ICD-10-CM

## 2023-05-08 DIAGNOSIS — F419 Anxiety disorder, unspecified: Secondary | ICD-10-CM

## 2023-05-08 DIAGNOSIS — G43709 Chronic migraine without aura, not intractable, without status migrainosus: Secondary | ICD-10-CM | POA: Diagnosis not present

## 2023-05-08 MED ORDER — AJOVY 225 MG/1.5ML ~~LOC~~ SOAJ: SUBCUTANEOUS | 11 refills | Status: DC

## 2023-05-08 NOTE — Progress Notes (Signed)
[facility]  PATIENT: Alejandra Conway DOB: 11-01-1985  REFERRING DOCTOR OR PCP: [name] (OB/GYN; Dulce Sellar NP (PCP) SOURCE: Patient, notes in epic  _________________________________   HISTORICAL  CHIEF COMPLAINT:  Chief Complaint  Patient presents with   New Patient (Initial Visit)    Pt in room 10. New patient here for headaches. Pt reports she has migraines since high school reports they have worsen.     HISTORY OF PRESENT ILLNESS:  I had the pleasure of seeing patient, [place], at [facility] for neurologic consultation regarding her chronic migraine.  She is a 37 year old with chronic migraine headaches for the past 10  years.  These occur 25/30 days a mont ofr > 4 hours a day.   Pain can be unilateral or bilateral.  She notes nausea with many of the headaches.    She reports photophobia and phonophobia.  Moving her head worsens the pain.   She notes odors sometimes trigger a migraine.    She tries to stay away from processed meats but is unsure they trigger one.   She does not have any aura now but did as a teenager.   When a migraine occurs, she tries to lie down in a quiet darker room.   If at work, she will take Excedrin or Ibuprofen but tries not to take with every HA.   Hot showers and ice packs to her forehead help some,     She was going to have a LEEP procedure 8/8/204 but had a severe migraine right after cervical anesthetic injection.  This was a more severe HA than usual.   She also has lower back pain.   She has tried topiramate without benefit.  Muscle relaxants (cyclobenzaprine, methocarbamol), NSAID (meloxicam), SSRI (fluoxetine, citalopram), SNRI (venlafaxine)   Riboflavin had not helped.   Rizatriptan and sumatriptan had not helped  She denies other medical issues.      REVIEW OF SYSTEMS: Constitutional: No fevers, chills, sweats, or change in appetite Eyes: No visual changes, double  vision, eye pain Ear, nose and throat: No hearing loss, ear pain, nasal congestion, sore throat Cardiovascular: No chest pain, palpitations Respiratory:  No shortness of breath at rest or with exertion.   No wheezes GastrointestinaI: No nausea, vomiting, diarrhea, abdominal pain, fecal incontinence Genitourinary:  No dysuria, urinary retention or frequency.  No nocturia. Musculoskeletal:  No neck pain, back pain Integumentary: No rash, pruritus, skin lesions Neurological: as above Psychiatric: No depression at this time.  No anxiety Endocrine: No palpitations, diaphoresis, change in appetite, change in weigh or increased thirst Hematologic/Lymphatic:  No anemia, purpura, petechiae. Allergic/Immunologic: No itchy/runny eyes, nasal congestion, recent allergic reactions, rashes  ALLERGIES: Allergies  Allergen Reactions   Pseudoephedrine Shortness Of Breath    HOME MEDICATIONS:  Current Outpatient Medications:    albuterol (VENTOLIN HFA) 108 (90 Base) MCG/ACT inhaler, Inhale 2 puffs into the lungs every 6 (six) hours as needed for wheezing or shortness of breath., Disp: 18 g, Rfl: 3   aspirin-acetaminophen-caffeine (EXCEDRIN MIGRAINE) 250-250-65 MG tablet, Take by mouth every 6 (six) hours as needed for headache., Disp: , Rfl:    Fremanezumab-vfrm (AJOVY) 225 MG/1.5ML SOAJ, One pen subcutaneous every 4 weeks., Disp: 1.68 mL, Rfl: 11  PAST MEDICAL HISTORY: Past Medical History:  Diagnosis Date   Abnormal Pap smear    Asthma    Back muscle spasm 06/15/2020   Benign skin growth 03/04/2020   Depression with anxiety 05/03/2015   Depression,  Moderate 05/13/2015   Depression & Anxiety; does not want to discuss w/ husband in room   Dysuria 06/15/2020   Midline low back pain without sciatica 04/16/2013   Migraine    Overweight 11/15/2018   Stress induced anxiety 05/04/2015   Depression & Anxiety; does not want to discuss w/ husband in room   Urinary tract infection without hematuria  07/15/2020   Urinary urgency 07/13/2020    PAST SURGICAL HISTORY: History reviewed. No pertinent surgical history.  FAMILY HISTORY: Family History  Problem Relation Age of Onset   Heart disease Mother    Ovarian cancer Mother    Thyroid cancer Mother    Heart disease Paternal Uncle    Cancer Maternal Grandfather    Alkaptonuria Son     SOCIAL HISTORY: Social History   Socioeconomic History   Marital status: Divorced    Spouse name: Not on file   Number of children: 3   Years of education: Not on file   Highest education level: Associate degree: occupational, Scientist, product/process development, or vocational program  Occupational History   Not on file  Tobacco Use   Smoking status: Never    Passive exposure: Never   Smokeless tobacco: Never  Vaping Use   Vaping status: Not on file  Substance and Sexual Activity   Alcohol use: Not Currently    Comment: rare   Drug use: No   Sexual activity: Yes    Partners: Male    Comment: vasectomy-fiance, menarche 37yo, sexual debut 37yo  Other Topics Concern   Not on file  Social History Narrative   Right handed   Drink coffee    Occ soda usage    Social Determinants of Health   Financial Resource Strain: Not on file  Food Insecurity: Not on file  Transportation Needs: Not on file  Physical Activity: Not on file  Stress: Not on file  Social Connections: Not on file  Intimate Partner Violence: Not on file       PHYSICAL EXAM  Vitals:   05/08/23 1247 05/08/23 1251  BP: (!) 149/98 127/83  Pulse: 93 93  Weight: 146 lb (66.2 kg)   Height: 5\' 2"  (1.575 m)     Body mass index is 26.7 kg/m.   General: The patient is well-developed and well-nourished and in no acute distress  HEENT:  Head is [place]/AT.  Sclera are anicteric.  Funduscopic exam shows normal optic discs and retinal vessels.  Neck: No carotid bruits are noted.  The neck is mildly tender over the left occiput.  Cardiovascular: The heart has a regular rate and rhythm with a  normal S1 and S2. There were no murmurs, gallops or rubs.    Skin: Extremities are without rash or  edema.  Neurologic Exam  Mental status: The patient is alert and oriented x 3 at the time of the examination. The patient has apparent normal recent and remote memory, with an apparently normal attention span and concentration ability.   Speech is normal.  Cranial nerves: Extraocular movements are full. Pupils are equal, round, and reactive to light and accomodation.  Visual fields are full.  Facial symmetry is present. There is good facial sensation to soft touch bilaterally.Facial strength is normal.  Trapezius and sternocleidomastoid strength is normal. No dysarthria is noted.  The tongue is midline, and the patient has symmetric elevation of the soft palate. No obvious hearing deficits are noted.  Motor:  Muscle bulk is normal.   Tone is normal. Strength is  5 /  5 in all 4 extremities.   Sensory: Sensory testing is intact to pinprick, soft touch and vibration sensation in all 4 extremities.  Coordination: Cerebellar testing reveals good finger-nose-finger and heel-to-shin bilaterally.  Gait and station: Station is normal.   Gait is normal. Tandem gait is normal. Romberg is negative.   Reflexes: Deep tendon reflexes are symmetric and normal bilaterally.       DIAGNOSTIC DATA (LABS, IMAGING, TESTING) - I reviewed patient records, labs, notes, testing and imaging myself where available.  Lab Results  Component Value Date   WBC 5.5 11/01/2018   HGB 13.7 11/01/2018   HCT 39.0 11/01/2018   MCV 85 11/01/2018   PLT 191 11/01/2018      Component Value Date/Time   NA 142 11/01/2018 1128   K 4.2 11/01/2018 1128   CL 104 11/01/2018 1128   CO2 24 11/01/2018 1128   GLUCOSE 84 11/01/2018 1128   GLUCOSE 99 11/18/2013 0213   BUN 15 11/01/2018 1128   CREATININE 0.77 11/01/2018 1128   CREATININE 0.73 04/16/2013 1438   CALCIUM 10.0 11/01/2018 1128   PROT 6.6 11/18/2013 0213   ALBUMIN 3.0  (L) 11/18/2013 0213   AST 13 11/18/2013 0213   ALT 9 11/18/2013 0213   ALKPHOS 51 11/18/2013 0213   BILITOT <0.2 (L) 11/18/2013 0213   GFRNONAA 104 11/01/2018 1128   GFRAA 120 11/01/2018 1128   No results found for: "CHOL", "HDL", "LDLCALC", "LDLDIRECT", "TRIG", "CHOLHDL" No results found for: "HGBA1C" No results found for: "VITAMINB12" Lab Results  Component Value Date   TSH 1.230 11/01/2018       ASSESSMENT AND PLAN  Chronic migraine w/o aura, not intractable, w/o stat migr  Anxiety and depression   In summary, Ms. Fatica is a 37 year old woman with chronic migraine for the last decade.  She has tried several prophylactic agents including topiramate in the past.  These have not been of benefit.  We discussed options.  She would be an excellent candidate to try one of the anti-CGRP injections.   Sample Ajovy [id number] Exp 02/2024   and Nurtec 75 mg [id number]  05/25.  I will send in a prescription of the Ajovy. They active and exercise as tolerated.  Try to get 7 hours of sleep or more.  Avoid foods that might trigger migraine. She will return to see [place] in 6 months or sooner if there are new or worsening neurologic symptoms.  Thank you for asking me to see Ms. Alberto.  Please let me know if I can be of further assistance with her other patients in the future.  Gyan Cambre A. Epimenio Foot, MD, [facility] 05/08/2023, 3:11 PM Certified in Neurology, Clinical Neurophysiology, Sleep Medicine and Neuroimaging  [facility] [address] [place], [place] [address] [phone]

## 2023-05-24 ENCOUNTER — Encounter: Payer: Self-pay | Admitting: Obstetrics and Gynecology

## 2023-05-24 ENCOUNTER — Ambulatory Visit: Payer: Medicaid Other | Admitting: Obstetrics and Gynecology

## 2023-05-24 VITALS — BP 117/80 | HR 74 | Resp 16

## 2023-05-24 DIAGNOSIS — N871 Moderate cervical dysplasia: Secondary | ICD-10-CM | POA: Diagnosis not present

## 2023-05-24 NOTE — Progress Notes (Signed)
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 Procedure stopped. Leep to be done in outpatient setting  Had allergic reaction to epiwith lidocaine likely and had to stop procedure and would like to have the LEEP procedure in the OR. 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 ENDOCERVICAL, CURETTAGE: -  Fragments of endocervical tissue, negative for dysplasia.  B. 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  C. CERVIX, 2 O'CLOCK, BIOPSY: -  Focal area bordering on high-grade squamous intraepithelial lesion in the background of extensive low-grade squamous intraepithelial lesion/cervical intraepithelial neoplasia 1 of 3.    GROSS DESCRIPTION:  A.  Received in formalin is blood tinged mucus that is entirely submitted in one block.  Volume: 0.9 x 0.6 x 0.1 cm (1 B)  B.  Received in formalin are tan, soft tissue fragments that are submitted in toto. Number: 2 size: 0.2 and 0.4 cm blocks: 1  C.  Received in formalin are multiple fragments of tan soft tissue measuring 0.8 x 0.6 x 0.2 cm in aggregate.  The specimen is entirely submitted in 1 block. (KW, 03/31/2023)   Final Diagnosis performed by Orene Desanctis DO.   Electronically signed 04/02/2023 Technical component performed at [employer]. [facility], [address], [place], [place] [address].  Professional component performed at [facility], [address] [place], [place] [address].  Immunohistochemistry Technical component (if applicable) was performed at [facility]. [address], [address], [place], [place] [address].   IMMUNOHISTOCHEMISTRY DISCLAIMER (if applicable): Some of these immunohistochemical stains may have been developed and the performance characteristics determine by [facility]. Some may not have been cleared or approved by the U.S. Food and Drug Administration. The FDA has determined that such clearance or approval is not necessary. This test is used for clinical purposes. It should not be regarded as investigational or for research. This laboratory is certified under the Clinical Laboratory Improvement Amendments of 1988 (CLIA-88) as qualified to perform high complexity clinical laboratory testing.  The controls stained appropriately.   IHC stains are performed on formalin fixed, paraffin embedded tissue using a 3,3"diaminobenzidine (DAB) chromogen and Leica Bond Autostainer System. The staining intensity of the nucleus is score manually and is reported as the percentage of tumor cell nuclei demonstrating specific nuclear staining. The specimens are fixed in 10% Neutral Formalin for at least 6 hours and up to 72hrs. These tests are validated on decalcified tissue. Results should be interpreted with caution given the possibility of false negative results on decalcified specimens. Antibody Clones are as follows ER-clone 22F, PR-clone 16, Ki67- clone MM1. Some of these immunohistochemical stains may have been developed and the performance characteristics determined by [facility].    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 Explained she will need a driver to and from surgery and will need to take 1-2 day off after anesthesia.  She would like to schedule in the OR.  A  surgical request was sent. Counseled on  the LEEP procedure r/b/a/I  Discussed high resolution rate but with multiple procedures can lead to cervical incompetence during pregnancy and PTL.  She will still need to follow with repeat pap smears q 6 months after until 2 consecutive normal pap smears and then she can return to annual.  Patient agreed.  Dr. Karma Greaser 30 minutes spent on reviewing records, imaging,  and one on one patient time and counseling patient and documentation Dr. Karma Greaser

## 2023-05-30 ENCOUNTER — Telehealth: Payer: Self-pay

## 2023-05-30 ENCOUNTER — Other Ambulatory Visit (HOSPITAL_COMMUNITY): Payer: Self-pay

## 2023-05-30 NOTE — Telephone Encounter (Signed)
*  [facility]  Pharmacy Patient Advocate Encounter   Received notification from CoverMyMeds that prior authorization for AJOVY (fremanezumab-vfrm) injection 225MG /1.5ML auto-injectors  is required/requested.   Insurance verification completed.   The patient is insured through [facility] .   Per test claim: APPROVED from 05/30/2023 to 08/27/2023. Ran test claim, Copay is $$4.00. This test claim was processed through [facility]- copay amounts may vary at other pharmacies due to pharmacy/plan contracts, or as the patient moves through the different stages of their insurance plan.

## 2023-05-31 ENCOUNTER — Telehealth: Payer: Self-pay | Admitting: *Deleted

## 2023-05-31 NOTE — Telephone Encounter (Signed)
MyChart message to patient with pre-op instructions.   Encounter closed.

## 2023-06-04 ENCOUNTER — Encounter (HOSPITAL_BASED_OUTPATIENT_CLINIC_OR_DEPARTMENT_OTHER): Payer: Self-pay | Admitting: Obstetrics and Gynecology

## 2023-06-05 ENCOUNTER — Encounter (HOSPITAL_BASED_OUTPATIENT_CLINIC_OR_DEPARTMENT_OTHER): Payer: Self-pay | Admitting: Obstetrics and Gynecology

## 2023-06-05 NOTE — Progress Notes (Signed)
Spoke w/ via phone for pre-op interview--- pt Lab needs dos---- urine preg         Lab results------ no COVID test -----patient states asymptomatic no test needed Arrive at ------- 0530 on 06-12-2023 NPO after MN NO Solid Food.  Clear liquids from MN until--- 0430 Med rec completed Medications to take morning of surgery ----- none Diabetic medication ----- n/a Patient instructed no nail polish to be worn day of surgery Patient instructed to bring photo id and insurance card day of surgery Patient aware to have Driver (ride ) / caregiver    for 24 hours after surgery - sig other, adam Patient Special Instructions ----- n/a Pre-Op special Instructions ----- sent inbox message in epic to dr boswell, requested orders Patient verbalized understanding of instructions that were given at this phone interview. Patient denies chest pain, sob, fever, cough at the interview.

## 2023-06-06 ENCOUNTER — Encounter: Payer: Self-pay | Admitting: Obstetrics and Gynecology

## 2023-06-06 NOTE — Telephone Encounter (Signed)
Dr. Karma Greaser, do you have any specific recommendations? Patient is scheduled in OR.

## 2023-06-11 NOTE — Telephone Encounter (Signed)
Spoke with Selena Batten in [employer] Case r/s to 06/29/23 at 0945.  MyChart message to patient. Viewed by patient.    Routing to Dr. Michela Pitcher.

## 2023-06-12 DIAGNOSIS — Z01818 Encounter for other preprocedural examination: Secondary | ICD-10-CM

## 2023-06-20 ENCOUNTER — Encounter (HOSPITAL_BASED_OUTPATIENT_CLINIC_OR_DEPARTMENT_OTHER): Payer: Self-pay | Admitting: Obstetrics and Gynecology

## 2023-06-20 NOTE — Progress Notes (Signed)
Spoke w/ via phone for pre-op interview--- pt Lab needs dos---- urine preg         Lab results------ no COVID test -----patient states asymptomatic no test needed Arrive at ------- 0745 on 06-29-2023 NPO after MN NO Solid Food.  Clear liquids from MN until--- 0845 Med rec completed Medications to take morning of surgery ----- none Diabetic medication ----- n/a Patient instructed no nail polish to be worn day of surgery Patient instructed to bring photo id and insurance card day of surgery Patient aware to have Driver (ride ) / caregiver    for 24 hours after surgery - sig other, adam Patient Special Instructions ----- n/a Pre-Op special Instructions ----- sent inbox message in epic to dr boswell, requested orders Patient verbalized understanding of instructions that were given at this phone interview. Patient denies chest pain, sob, fever, cough at the interview.

## 2023-06-26 ENCOUNTER — Encounter: Payer: Medicaid Other | Admitting: Obstetrics and Gynecology

## 2023-06-29 ENCOUNTER — Other Ambulatory Visit: Payer: Self-pay

## 2023-06-29 ENCOUNTER — Encounter (HOSPITAL_BASED_OUTPATIENT_CLINIC_OR_DEPARTMENT_OTHER): Payer: Self-pay | Admitting: Obstetrics and Gynecology

## 2023-06-29 ENCOUNTER — Ambulatory Visit (HOSPITAL_BASED_OUTPATIENT_CLINIC_OR_DEPARTMENT_OTHER)
Admission: RE | Admit: 2023-06-29 | Discharge: 2023-06-29 | Disposition: A | Discharge status: Home / Self Care | Payer: Medicaid Other | Attending: Obstetrics and Gynecology | Admitting: Obstetrics and Gynecology

## 2023-06-29 ENCOUNTER — Encounter (HOSPITAL_BASED_OUTPATIENT_CLINIC_OR_DEPARTMENT_OTHER)
Admission: RE | Disposition: A | Discharge status: Home / Self Care | Payer: Self-pay | Source: Home / Self Care | Attending: Obstetrics and Gynecology

## 2023-06-29 ENCOUNTER — Ambulatory Visit (HOSPITAL_BASED_OUTPATIENT_CLINIC_OR_DEPARTMENT_OTHER): Payer: Medicaid Other | Admitting: Anesthesiology

## 2023-06-29 DIAGNOSIS — N871 Moderate cervical dysplasia: Secondary | ICD-10-CM

## 2023-06-29 DIAGNOSIS — N870 Mild cervical dysplasia: Secondary | ICD-10-CM | POA: Diagnosis not present

## 2023-06-29 DIAGNOSIS — J45909 Unspecified asthma, uncomplicated: Secondary | ICD-10-CM | POA: Insufficient documentation

## 2023-06-29 DIAGNOSIS — Z01818 Encounter for other preprocedural examination: Secondary | ICD-10-CM

## 2023-06-29 DIAGNOSIS — R87619 Unspecified abnormal cytological findings in specimens from cervix uteri: Secondary | ICD-10-CM

## 2023-06-29 HISTORY — DX: Depression, unspecified: F32.A

## 2023-06-29 HISTORY — DX: Moderate cervical dysplasia: N87.1

## 2023-06-29 HISTORY — DX: Vasectomy status: Z98.52

## 2023-06-29 HISTORY — DX: Personal history of other diseases of the female genital tract: Z87.42

## 2023-06-29 HISTORY — DX: Anxiety disorder, unspecified: F41.9

## 2023-06-29 HISTORY — DX: Palpitations: R00.2

## 2023-06-29 HISTORY — PX: CERVICAL BIOPSY  W/ LOOP ELECTRODE EXCISION: SUR135

## 2023-06-29 HISTORY — DX: Migraine without aura, not intractable, without status migrainosus: G43.009

## 2023-06-29 HISTORY — PX: LEEP: SHX91

## 2023-06-29 LAB — POCT PREGNANCY, URINE: Preg Test, Ur: NEGATIVE

## 2023-06-29 SURGERY — LEEP (LOOP ELECTROSURGICAL EXCISION PROCEDURE)
Anesthesia: General | Site: Cervix

## 2023-06-29 MED ORDER — MEPERIDINE HCL 25 MG/ML IJ SOLN: 6.2500 mg | INTRAMUSCULAR | Status: DC | PRN

## 2023-06-29 MED ORDER — ONDANSETRON HCL 4 MG/2ML IJ SOLN
INTRAMUSCULAR | Status: AC
  Filled 2023-06-29: qty 2

## 2023-06-29 MED ORDER — DEXAMETHASONE SODIUM PHOSPHATE 10 MG/ML IJ SOLN
INTRAMUSCULAR | Status: AC
  Filled 2023-06-29: qty 1

## 2023-06-29 MED ORDER — STERILE WATER FOR IRRIGATION IR SOLN
Status: DC | PRN
Start: 2023-06-29 — End: 2023-06-29
  Administered 2023-06-29: 500 mL

## 2023-06-29 MED ORDER — LIDOCAINE-EPINEPHRINE (PF) 1 %-1:200000 IJ SOLN
INTRAMUSCULAR | Status: DC | PRN
  Administered 2023-06-29: 16 mL

## 2023-06-29 MED ORDER — FENTANYL CITRATE (PF) 100 MCG/2ML IJ SOLN
INTRAMUSCULAR | Status: AC
  Filled 2023-06-29: qty 2

## 2023-06-29 MED ORDER — MONSELS FERRIC SUBSULFATE EX SOLN
CUTANEOUS | Status: DC | PRN
  Administered 2023-06-29: 1 via TOPICAL

## 2023-06-29 MED ORDER — LACTATED RINGERS IV SOLN: INTRAVENOUS | Status: DC

## 2023-06-29 MED ORDER — DROPERIDOL 2.5 MG/ML IJ SOLN: 0.6250 mg | Freq: Once | INTRAMUSCULAR | Status: DC | PRN

## 2023-06-29 MED ORDER — POVIDONE-IODINE 10 % EX SWAB: 2.0000 | Freq: Once | CUTANEOUS | Status: DC

## 2023-06-29 MED ORDER — LIDOCAINE HCL (PF) 2 % IJ SOLN
INTRAMUSCULAR | Status: AC
  Filled 2023-06-29: qty 5

## 2023-06-29 MED ORDER — LIDOCAINE HCL (CARDIAC) PF 100 MG/5ML IV SOSY
PREFILLED_SYRINGE | INTRAVENOUS | Status: DC | PRN
  Administered 2023-06-29: 60 mg via INTRAVENOUS

## 2023-06-29 MED ORDER — ACETAMINOPHEN 500 MG PO TABS
ORAL_TABLET | ORAL | Status: AC
  Filled 2023-06-29: qty 2

## 2023-06-29 MED ORDER — FENTANYL CITRATE (PF) 100 MCG/2ML IJ SOLN
INTRAMUSCULAR | Status: DC | PRN
  Administered 2023-06-29 (×4): 25 ug via INTRAVENOUS

## 2023-06-29 MED ORDER — KETOROLAC TROMETHAMINE 30 MG/ML IJ SOLN
INTRAMUSCULAR | Status: DC | PRN
Start: 2023-06-29 — End: 2023-06-29
  Administered 2023-06-29: 30 mg via INTRAVENOUS

## 2023-06-29 MED ORDER — DEXAMETHASONE SODIUM PHOSPHATE 4 MG/ML IJ SOLN
INTRAMUSCULAR | Status: DC | PRN
  Administered 2023-06-29: 5 mg via INTRAVENOUS

## 2023-06-29 MED ORDER — IODINE STRONG (LUGOLS) 5 % PO SOLN
ORAL | Status: DC | PRN
  Administered 2023-06-29: .1 mL

## 2023-06-29 MED ORDER — PROPOFOL 10 MG/ML IV BOLUS
INTRAVENOUS | Status: DC | PRN
  Administered 2023-06-29: 200 mg via INTRAVENOUS
  Administered 2023-06-29: 100 mg via INTRAVENOUS

## 2023-06-29 MED ORDER — ONDANSETRON HCL 4 MG/2ML IJ SOLN
INTRAMUSCULAR | Status: DC | PRN
  Administered 2023-06-29: 4 mg via INTRAVENOUS

## 2023-06-29 MED ORDER — PROPOFOL 10 MG/ML IV BOLUS
INTRAVENOUS | Status: AC
  Filled 2023-06-29: qty 20

## 2023-06-29 MED ORDER — OXYCODONE HCL 5 MG PO TABS: 5.0000 mg | ORAL_TABLET | Freq: Once | ORAL | Status: DC | PRN

## 2023-06-29 MED ORDER — ACETAMINOPHEN 500 MG PO TABS
1000.0000 mg | ORAL_TABLET | ORAL | Status: AC
  Administered 2023-06-29: 1000 mg via ORAL

## 2023-06-29 MED ORDER — LACTATED RINGERS IV SOLN: INTRAVENOUS | Status: AC

## 2023-06-29 MED ORDER — MIDAZOLAM HCL 2 MG/2ML IJ SOLN
INTRAMUSCULAR | Status: AC
  Filled 2023-06-29: qty 2

## 2023-06-29 MED ORDER — HYDROMORPHONE HCL 1 MG/ML IJ SOLN: 0.2500 mg | INTRAMUSCULAR | Status: DC | PRN

## 2023-06-29 MED ORDER — MIDAZOLAM HCL 5 MG/5ML IJ SOLN
INTRAMUSCULAR | Status: DC | PRN
  Administered 2023-06-29: 2 mg via INTRAVENOUS

## 2023-06-29 MED ORDER — OXYCODONE HCL 5 MG/5ML PO SOLN: 5.0000 mg | Freq: Once | ORAL | Status: DC | PRN

## 2023-06-29 SURGICAL SUPPLY — 18 items
APL SWBSTK 6 STRL LF DISP (MISCELLANEOUS) ×1
APPLICATOR COTTON TIP 6 STRL (MISCELLANEOUS) ×2 IMPLANT
APPLICATOR COTTON TIP 6IN STRL (MISCELLANEOUS) ×1
BLANKET WARM UPPER BOD BAIR (MISCELLANEOUS) ×2 IMPLANT
ELECT LOOP LEEP RND 20X12 WHT (CUTTING LOOP) ×1
ELECT REM PT RETURN 9FT ADLT (ELECTROSURGICAL) ×1
ELECTRODE LOOP LP RND 20X12WHT (CUTTING LOOP) IMPLANT
ELECTRODE REM PT RTRN 9FT ADLT (ELECTROSURGICAL) ×2 IMPLANT
GLOVE BIOGEL PI IND STRL 7.0 (GLOVE) ×2 IMPLANT
GLOVE NEODERM STER SZ 7 (GLOVE) ×2 IMPLANT
GOWN STRL REUS W/ TWL LRG LVL3 (GOWN DISPOSABLE) ×2 IMPLANT
GOWN STRL REUS W/TWL LRG LVL3 (GOWN DISPOSABLE) ×1 IMPLANT
PACK VAGINAL WOMENS (CUSTOM PROCEDURE TRAY) ×2 IMPLANT
PAD OB MATERNITY 4.3X12.25 (PERSONAL CARE ITEMS) ×2 IMPLANT
SCOPETTES 8 STERILE (MISCELLANEOUS) ×2 IMPLANT
SCRUB CHG 4% DYNA-HEX 4OZ (MISCELLANEOUS) ×2 IMPLANT
TOWEL OR 17X24 6PK STRL BLUE (TOWEL DISPOSABLE) ×2 IMPLANT
WATER STERILE IRR 500ML POUR (IV SOLUTION) IMPLANT

## 2023-06-29 NOTE — Interval H&P Note (Signed)
History and Physical Interval Note:  06/29/2023 9:01 AM  Alejandra Conway  has presented today for surgery, with the diagnosis of CIN 2.  The various methods of treatment have been discussed with the patient and family. After consideration of risks, benefits and other options for treatment, the patient has consented to  Procedure(s): LOOP ELECTROSURGICAL EXCISION PROCEDURE (LEEP) with ECC (N/A) as a surgical intervention.  The patient's history has been reviewed, patient examined, no change in status, stable for surgery.  I have reviewed the patient's chart and labs.  Questions were answered to the patient's satisfaction.     Earley Favor

## 2023-06-29 NOTE — Anesthesia Postprocedure Evaluation (Signed)
Anesthesia Post Note  Patient: Alejandra Conway  Procedure(s) Performed: LOOP ELECTROSURGICAL EXCISION PROCEDURE (LEEP) with ENDOCERVICAL SAMPLE (Cervix)     Patient location during evaluation: PACU Anesthesia Type: General Level of consciousness: sedated and patient cooperative Pain management: pain level controlled Vital Signs Assessment: post-procedure vital signs reviewed and stable Respiratory status: spontaneous breathing Cardiovascular status: stable Anesthetic complications: no   No notable events documented.  Last Vitals:  Vitals:   06/29/23 1130 06/29/23 1157  BP: 122/74 121/85  Pulse: (!) 56 64  Resp: 13 15  Temp:  36.6 C  SpO2: 100% 99%    Last Pain:  Vitals:   06/29/23 1157  TempSrc:   PainSc: 0-No pain                 Lewie Loron

## 2023-06-29 NOTE — Transfer of Care (Signed)
Immediate Anesthesia Transfer of Care Note  Patient: Alejandra Conway  Procedure(s) Performed: Procedure(s) (LRB): LOOP ELECTROSURGICAL EXCISION PROCEDURE (LEEP) with ENDOCERVICAL SAMPLE (N/A)  Patient Location: PACU  Anesthesia Type: GA  Level of Consciousness: awake, sedated, patient cooperative and responds to stimulation  Airway & Oxygen Therapy: Patient Spontanous Breathing and Patient connected to [place] oxygen  Post-op Assessment: Report given to PACU RN, Post -op Vital signs reviewed and stable and Patient moving all extremities  Post vital signs: Reviewed and stable  Complications: No apparent anesthesia complications

## 2023-06-29 NOTE — Brief Op Note (Signed)
06/29/2023  10:52 AM  PATIENT:  Alejandra Conway  37 y.o. female  PRE-OPERATIVE DIAGNOSIS:  CIN 2  POST-OPERATIVE DIAGNOSIS:  * No post-op diagnosis entered *  PROCEDURE:  Procedure(s): LOOP ELECTROSURGICAL EXCISION PROCEDURE (LEEP) with ENDOCERVICAL SAMPLE (N/A)  SURGEON:  Surgeons and Role:    * Earley Favor, MD - Primary  PHYSICIAN ASSISTANT:   ASSISTANTS: none   ANESTHESIA:    LMA  EBL:  5 mL   BLOOD ADMINISTERED:none  DRAINS: none   LOCAL MEDICATIONS USED:  MARCAINE     SPECIMEN:  ECC before and after procedure, leep portion at 12 and 6 o'clock  DISPOSITION OF SPECIMEN:  PATHOLOGY  COUNTS:  YES  TOURNIQUET:  * No tourniquets in log *  DICTATION: .Note written in EPIC  PLAN OF CARE: Discharge to home after PACU  PATIENT DISPOSITION:  PACU - hemodynamically stable.   Delay start of Pharmacological VTE agent (>24hrs) due to surgical blood loss or risk of bleeding: no

## 2023-06-29 NOTE — Anesthesia Preprocedure Evaluation (Addendum)
Anesthesia Evaluation  Patient identified by MRN, date of birth, ID band Patient awake    Reviewed: Allergy & Precautions, NPO status , Patient's Chart, lab work & pertinent test results  Airway Mallampati: II  TM Distance: >3 FB Neck ROM: Full    Dental no notable dental hx. (+) Dental Advisory Given, Teeth Intact   Pulmonary asthma    Pulmonary exam normal breath sounds clear to auscultation       Cardiovascular negative cardio ROS Normal cardiovascular exam Rhythm:Regular Rate:Normal     Neuro/Psych  Headaches PSYCHIATRIC DISORDERS Anxiety Depression       GI/Hepatic negative GI ROS, Neg liver ROS,,,  Endo/Other  negative endocrine ROS    Renal/GU negative Renal ROS     Musculoskeletal negative musculoskeletal ROS (+)    Abdominal   Peds  Hematology negative hematology ROS (+)   Anesthesia Other Findings   Reproductive/Obstetrics negative OB ROS                             Anesthesia Physical Anesthesia Plan  ASA: 2  Anesthesia Plan: General   Post-op Pain Management: Tylenol PO (pre-op)*   Induction: Intravenous  PONV Risk Score and Plan: 4 or greater and Ondansetron, Dexamethasone, Midazolam and Treatment may vary due to age or medical condition  Airway Management Planned: LMA  Additional Equipment:   Intra-op Plan:   Post-operative Plan: Extubation in OR  Informed Consent: I have reviewed the patients History and Physical, chart, labs and discussed the procedure including the risks, benefits and alternatives for the proposed anesthesia with the patient or authorized representative who has indicated his/her understanding and acceptance.     Dental advisory given  Plan Discussed with: CRNA  Anesthesia Plan Comments:         Anesthesia Quick Evaluation

## 2023-06-29 NOTE — Op Note (Addendum)
   OPERATIVE NOTE  [id number] 06/29/2023 Alejandra Conway   PROCEDURE: LEEP, ECC PREOP DIAGNOSIS: CINII abnormal pap smears POSTOP DIAGNOSIS: Same EBL: MIN FLUIDS: See Anesthesia report SPECIMEN:  LEEP AT 12 and 6o'clock, ECC before and after the procedure. ANESTHESIA: LMA COMPLICATIONS: None   Risks, benefits, alternatives, and limitations of procedure explained to patient, including pain, bleeding, infection, need for repeat procedures, damage to pelvic organs, cervical incompetence.  Role of HPV,cervical dysplasia and need for close followup was empasized. Informed written consent was obtained. All questions were answered. Time out performed and everyone agreed.  ??Procedure: The patient was placed in lithotomy position and the bivalved coated speculum was placed in the patient's vagina. A grounding pad placed on the patient. Lugol's solution was applied to the cervix and areas of decreased uptake were noted around the transformation zone.   Local anesthesia was administered via an intracervical block using 10 ml of 2% Lidocaine with epinephrine. The suction was turned on and the Large Fisher Cone Biopsy Excisor on 50 Watts of blended current was used to excise the area of decreased uptake and excise the entire transformation zone. Excellent hemostasis was achieved using Monsel's solution. The speculum was removed from the vagina. Specimens were sent to pathology.  Sponge, lap, instrument count were correct x2.  ?The patient tolerated the procedure well and was taken to PACU in stable condition   Arman Filter, MD, FACOG Obstetrician & Gynecologist [facility], [facility]

## 2023-06-29 NOTE — Discharge Instructions (Addendum)
PLEASE DO NOT TAKE MOTRIN UNTIL AFTER 4:45p & TYLENOL until 4p today.    Post Anesthesia Home Care Instructions  Activity: Get plenty of rest for the remainder of the day. A responsible adult should stay with you for 24 hours following the procedure.  For the next 24 hours, DO NOT: -Drive a car -Advertising copywriter -Drink alcoholic beverages -Take any medication unless instructed by your physician -Make any legal decisions or sign important papers.  Meals: Start with liquid foods such as gelatin or soup. Progress to regular foods as tolerated. Avoid greasy, spicy, heavy foods. If nausea and/or vomiting occur, drink only clear liquids until the nausea and/or vomiting subsides. Call your physician if vomiting continues.  Special Instructions/Symptoms: Your throat may feel dry or sore from the anesthesia or the breathing tube placed in your throat during surgery. If this causes discomfort, gargle with warm salt water. The discomfort should disappear within 24 hours.   LEEP Post-procedure Instructions Cramping is common.  You may take Ibuprofen, Aleve, or Tylenol for the cramping.  This should resolve within the next two to three days.   You may have bright red spotting or blackish discharge for several days after your procedure.  The discharge occurs because of a topical solution used to stop bleeding at the biopsy site(s).  The dark discharge will lighten and then turn clear before completely resolving.  You will need to wear a mini pad during this time. Marland Kitchen Refrain from putting anything in the vagina until the bleeding and/or discharge COMPLETLEY stops (usually two to three weeks). You need to call the office if you have any pelvic pain, fever, heavy bleeding, foul smelling vaginal discharge, or if you are concerned. Shower only for 2 weeks Post op appointment in 2 weeks. You will need to return for surveillance Pap smear(s) as advised by your physician.

## 2023-06-29 NOTE — Anesthesia Procedure Notes (Signed)
Procedure Name: LMA Insertion Date/Time: 06/29/2023 10:23 AM  Performed by: Jessica Priest, CRNAPre-anesthesia Checklist: Patient identified, Emergency Drugs available, Suction available, Patient being monitored and Timeout performed Patient Re-evaluated:Patient Re-evaluated prior to induction Oxygen Delivery Method: Circle system utilized Preoxygenation: Pre-oxygenation with 100% oxygen Induction Type: IV induction Ventilation: Mask ventilation without difficulty LMA: LMA inserted LMA Size: 4.0 Number of attempts: 1 Airway Equipment and Method: Bite block Placement Confirmation: positive ETCO2, breath sounds checked- equal and bilateral and CO2 detector Tube secured with: Tape Dental Injury: Teeth and Oropharynx as per pre-operative assessment

## 2023-06-29 NOTE — H&P (Signed)
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 Procedure stopped. Leep to be done in outpatient setting  Had allergic reaction to epiwith lidocaine likely and had to stop procedure and would like to have the LEEP procedure in the OR. 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 ENDOCERVICAL, CURETTAGE: -  Fragments of endocervical tissue, negative for dysplasia.  B. 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  C. CERVIX, 2 O'CLOCK, BIOPSY: -  Focal area bordering on high-grade squamous intraepithelial lesion in the background of extensive low-grade squamous intraepithelial lesion/cervical intraepithelial neoplasia 1 of 3.    GROSS DESCRIPTION:  A.  Received in formalin is blood tinged mucus that is entirely submitted in one block.  Volume: 0.9 x 0.6 x 0.1 cm (1  B)  B.  Received in formalin are tan, soft tissue fragments that are submitted in toto. Number: 2 size: 0.2 and 0.4 cm blocks: 1  C.  Received in formalin are multiple fragments of tan soft tissue measuring 0.8 x 0.6 x 0.2 cm in aggregate.  The specimen is entirely submitted in 1 block. (KW, 03/31/2023)   Final Diagnosis performed by Orene Desanctis DO.   Electronically signed 04/02/2023 Technical component performed at [employer]. [facility], [address] [place], [place] [address].  Professional component performed at [facility], [address], [place], [place] [address].  Immunohistochemistry Technical component (if applicable) was performed at [facility]. [address], [address], [place], [place] [address].   IMMUNOHISTOCHEMISTRY DISCLAIMER (if applicable): Some of these immunohistochemical stains may have been developed and the performance characteristics determine by [facility]. Some may not have been cleared or approved by the U.S. Food and Drug Administration. The FDA has determined that such clearance or approval is not necessary. This test is used for clinical purposes. It should not be regarded as investigational or for research. This laboratory is certified under the Clinical Laboratory Improvement Amendments of 1988 (CLIA-88) as qualified to perform high complexity clinical laboratory testing.  The controls stained appropriately.   IHC stains are performed on formalin fixed, paraffin embedded tissue using a 3,3"diaminobenzidine (DAB) chromogen and Leica Bond Autostainer System. The staining intensity of the nucleus is score manually and is reported as the percentage of tumor cell nuclei demonstrating specific nuclear staining. The specimens  are fixed in 10% Neutral Formalin for at least 6 hours and up to 72hrs. These tests are validated on decalcified tissue. Results should be interpreted with caution given  the possibility of false negative results on decalcified specimens. Antibody Clones are as follows ER-clone 72F, PR-clone 16, Ki67- clone MM1. Some of these immunohistochemical stains may have been developed and the performance characteristics determined by [facility].      Past Medical History:  Diagnosis Date   Anxiety    Asthma    followed by pcp   CIN II (cervical intraepithelial neoplasia II)    Depression    History of abnormal cervical Pap smear    Intermittent palpitations    06-05-2023  per pt from time to time feels palpitations , had monitor yrs ago, normal   Migraine without aura and without status migrainosus, not intractable    neurologist--- dr Epimenio Foot   Past Surgical History:  Procedure Laterality Date   NO PAST SURGERIES     WISDOM TOOTH EXTRACTION  2014   Social History   Socioeconomic History   Marital status: Divorced    Spouse name: Not on file   Number of children: 3   Years of education: Not on file   Highest education level: Associate degree: occupational, Scientist, product/process development, or vocational program  Occupational History   Not on file  Tobacco Use   Smoking status: Never    Passive exposure: Never   Smokeless tobacco: Never  Vaping Use   Vaping status: Never Used  Substance and Sexual Activity   Alcohol use: Not Currently    Comment: rare   Drug use: Never   Sexual activity: Yes    Partners: Male    Birth control/protection: Other-see comments    Comment: vasectomy-fiance, menarche 37yo, sexual debut 37yo  Other Topics Concern   Not on file  Social History Narrative   Right handed   Drink coffee    Occ soda usage    Social Determinants of Health   Financial Resource Strain: Not on file  Food Insecurity: Not on file  Transportation Needs: Not on file  Physical Activity: Not on file  Stress: Not on file  Social Connections: Not on file  Intimate Partner Violence: Not on file   Allergies  Allergen Reactions   Pseudoephedrine  Shortness Of Breath   Past Surgical History:  Procedure Laterality Date   NO PAST SURGERIES     WISDOM TOOTH EXTRACTION  2014    Blood pressure 134/78, pulse 73, temperature 97.9 F (36.6 C), temperature source Oral, resp. rate 16, height 5\' 2"  (1.575 m), weight 66 kg, last menstrual period 06/11/2023, SpO2 99%.  S1S2 CTA B/L Soft, NT CIN2 could not tolerate office procedure  Offered to try again in office without lidocaine or have the LEEP in the OR. Explained she will need a driver to and from surgery and will need to take 1-2 day off after anesthesia.  She would like to schedule in the OR.  A surgical request was sent. Counseled on the LEEP procedure r/b/a/I  Discussed high resolution rate but with multiple procedures can lead to cervical incompetence during pregnancy and PTL. Counseled on the risk of bleeding, infection, risk for additional procedure.  She will still need to follow with repeat pap smears q 6 months after until 2 consecutive normal pap smears and then she can return to annual.  Patient agreed.   Dr. Karma Greaser 30 minutes spent on reviewing records, imaging,  and one on one  patient time and counseling patient and documentation Dr. Karma Greaser

## 2023-07-02 LAB — SURGICAL PATHOLOGY

## 2023-07-02 NOTE — Plan of Care (Signed)
[facility] Tonsillectomy/Adenoidectomy, Postoperative PEDS care plan entered in error.

## 2023-07-03 ENCOUNTER — Encounter (HOSPITAL_BASED_OUTPATIENT_CLINIC_OR_DEPARTMENT_OTHER): Payer: Self-pay | Admitting: Obstetrics and Gynecology

## 2023-07-10 ENCOUNTER — Encounter: Payer: Self-pay | Admitting: Obstetrics and Gynecology

## 2023-07-12 ENCOUNTER — Ambulatory Visit (INDEPENDENT_AMBULATORY_CARE_PROVIDER_SITE_OTHER): Payer: Medicaid Other | Admitting: Obstetrics and Gynecology

## 2023-07-12 ENCOUNTER — Encounter: Payer: Self-pay | Admitting: Obstetrics and Gynecology

## 2023-07-12 VITALS — BP 120/76 | HR 72

## 2023-07-12 DIAGNOSIS — Z9889 Other specified postprocedural states: Secondary | ICD-10-CM

## 2023-07-12 NOTE — Progress Notes (Signed)
Patient presents for 2 week postop from LEEP She is doing well. No fevers, foul discharge, or abdominal pain Did not have have bleeding after but is having period like bleeding since Wednesday.  She is using 3-5 light pads a day.  Her LMP was 10/1 She is not on any ocp's  Blood pressure 104/70, pulse 72, weight 184 lb (83.5 kg), last menstrual period 06/13/2023, SpO2 100%.    PATHOLOGY SURGICAL PATHOLOGY CASE: [id number] PATIENT: Alejandra Conway Surgical Pathology Report     Clinical History: CIN 2 (las)     FINAL MICROSCOPIC DIAGNOSIS:  A. ENDOCERVICAL, CURETTAGE: - Benign cervical glandular mucosa - Negative for dysplasia or malignancy  B. CERVIX, AT 12 O'CLOCK: - Focal koilocytic atypia, consistent with low-grade squamous intraepithelial lesion (CIN1, low-grade dysplasia)  C. CERVIX, AT 6 O'CLOCK: - Low-grade squamous intraepithelial lesion (CIN1, low grade dysplasia)  D. ENDOCERVICAL, CURETTAGE: - Benign cervical glandular mucosa - Negative for dysplasia or malignancy       GROSS DESCRIPTION:  A: The specimen is received in formalin and consists of a 0.4 x 0.2 x 0.1 cm tan mucus.  The specimen is entirely submitted in 1 cassette.  B: Specimen is received in formalin and consists of a 1.8 x 0.8 x 0.4 cm piece of tan-pink soft tissue.  1 surface is smooth and glistening, and = and surfaces tan and roughened.  Possible ectocervical resection margins inked black, and endocervical resection margin is inked yellow. The specimen is serially sectioned and entirely submitted in 2 cassettes.  C: Specimen is received in formalin and consists of a 2.0 x 0.8 x 0.4 cm piece of tan-pink soft tissue.  1 surface is smooth and glistening, the opposing surfaces tan and roughened.  The possible ectocervical resection margins inked black, and the endocervical resection margin is inked yellow.  Specimen serially section and entirely submitted in 2 cassettes.  D:  Specimen is received in formalin, and consists of a 0.9 x 0.3 x 0.2 cm aggregate of red-brown clotted blood.  Specimen is entirely submitted in 1 cassette.  Lovey Newcomer 06/29/2023)   Final Diagnosis performed by Holley Bouche, MD.   Electronically signed 07/02/2023 Technical and / or Professional components performed at [facility], [address] [place], [place] [address].  Immunohistochemistry Technical component (if applicable) was performed at [facility]. [address], [address], [place], [place] [address].   IMMUNOHISTOCHEMISTRY DISCLAIMER (if applicable): Some of these immunohistochemical stains may have been developed and the performance characteristics determine by [facility]. Some may not have been cleared or approved by the U.S. Food and Drug Administration. The FDA has determined that such clearance or approval is not necessary. This test is used for clinical purposes. It should not be regarded as investigational or for research. This laboratory is certified under the Clinical Laboratory Improvement Amendments of 1988 (CLIA-88) as qualified to perform high complexity clinical laboratory testing.  The controls stained appropriately.   IHC stains are performed on formalin fixed, paraffin embedded tissue using a 3,3"diaminobenzidine (DAB) chromogen and Leica Bond Autostainer System. The staining intensity of the nucleus is score manually and is reported as the percentage of tumor cell nuclei demonstrating specific nuclear staining. The specimens are fixed in 10% Neutral Formalin for at least 6 hours and up to 72hrs. These tests are validated on decalcified tissue. Results should be interpreted with caution given the possibility of false negative results on decalcified specimens. Antibody Clones are as follows ER-clone 57F, PR-clone 16,  Ki67- clone MM1. Some of these immunohistochemical stains may have been developed and the  performance characteristics determined by [facility].  Resulting Agency [facility]                A/p PO from LEEP  Likely offset period from surgery.  To return with persistent bleeding beyond 10-15 days or if the cycle becomes heavier Repeat pap smear in 6 months Pathology discussed with patient   Dr. Karma Greaser

## 2023-07-13 ENCOUNTER — Encounter: Payer: Medicaid Other | Admitting: Obstetrics and Gynecology

## 2023-07-31 ENCOUNTER — Encounter: Payer: Self-pay | Admitting: Family

## 2023-08-01 ENCOUNTER — Telehealth: Payer: Self-pay | Admitting: Family

## 2023-08-01 NOTE — Telephone Encounter (Signed)
Final outcome: See PCP within 24 hours. Patient is already set to see PCP tomorrow, 08/02/23 @ 2:30 pm. Per note, pt stated she would be coming to that appointment.    Patient Name First: Alejandra Last: Conway Gender: Female DOB: 1986-03-05 Age: 37 Y 11 M 24 D Return Phone Number: [phone] (Primary) Address: City/ State/ Zip: [place]  [address] Client [facility] - Administrator, [facility] at [facility] Contact Type Call Who Is Calling Patient / Member / Family / Caregiver Call Type Triage / Clinical Relationship To Patient Self Return Phone Number [phone] (Primary) Chief Complaint Abdominal Pain Reason for Call Symptomatic / Request for Health Information Initial Comment Caller states she has a patient for triage. She has been having sharp pain in her lower right abdomen for 2-3 weeks. Recently it started moving from her right side to her left flank area. She also has nausea. Neville Route is her doctor. Translation No Nurse Assessment Nurse: Stefano Gaul, RN, Dwana Curd Date/Time (Eastern Time): 08/01/2023 11:50:34 AM Confirm and document reason for call. If symptomatic, describe symptoms. ---Caller states she has been having intermittent right sided abd pain. has had pain this am. nauseated. had some left flank pain on Friday. no vomiting. Had LEEP procedure on Oct 18th. no fever. Does the patient have any new or worsening symptoms? ---Yes Will a triage be completed? ---Yes Related visit to physician within the last 2 weeks? ---No Does the PT have any chronic conditions? (i.e. diabetes, asthma, this includes High risk factors for pregnancy, etc.) ---Yes List chronic conditions. ---asthma Is the patient pregnant or possibly pregnant? (Ask all females between the ages of 56-55) ---No Is this a behavioral health or substance abuse call? ---No Guidelines Guideline Title Affirmed Question Affirmed  Notes Nurse Date/Time (Eastern Time) Abdominal Pain - Female [1] MODERATE pain (e.g., interferes with normal activities) AND [2] Stefano Gaul, RN, Dwana Curd 08/01/2023 11:53:43 AM Guidelines Guideline Title Affirmed Question Affirmed Notes Nurse Date/Time (Eastern Time) pain comes and goes (cramps) AND [3] present > 24 hours (Exception: Pain with Vomiting or Diarrhea - see that Guideline.) Disp. Time Lamount Cohen Time) Disposition Final User 08/01/2023 11:49:42 AM Send to Urgent Geanie Logan 08/01/2023 11:57:35 AM See PCP within 24 Hours Yes Stefano Gaul, RN, Dwana Curd Final Disposition 08/01/2023 11:57:35 AM See PCP within 24 Hours Yes Stefano Gaul, RN, Clerance Lav Disagree/Comply Disagree Caller Understands Yes PreDisposition Call Doctor Care Advice Given Per Guideline SEE PCP WITHIN 24 HOURS: * IF OFFICE WILL BE OPEN: You need to be examined within the next 24 hours. Call your doctor (or NP/PA) when the office opens and make an appointment. DIET: * Drink adequate fluids. Eat a bland diet. CALL BACK IF: * Severe pain lasts over 1 hour * Constant pain lasts over 2 hours * You become worse * Avoid alcohol or caffeinated beverages Comments User: Art Buff, RN Date/Time Lamount Cohen Time): 08/01/2023 11:56:46 AM Pt states she has appt for tomorrow at 2:30 pm and will just keep that one Referrals REFERRED TO PCP OFFICE

## 2023-08-01 NOTE — Telephone Encounter (Signed)
FYI: This call has been transferred to triage nurse: Access Nurse. Once the result note has been entered staff can address the message at that time.  Patient called in with the following symptoms:  Red Word:abdominal pain   Please advise at Mobile [phone] (mobile)  Message is routed to Provider Pool.

## 2023-08-02 ENCOUNTER — Ambulatory Visit: Payer: Medicaid Other | Admitting: Family

## 2023-08-02 VITALS — BP 120/74 | HR 74 | Temp 98.2°F | Ht 62.0 in | Wt 147.4 lb

## 2023-08-02 DIAGNOSIS — R1031 Right lower quadrant pain: Secondary | ICD-10-CM | POA: Diagnosis not present

## 2023-08-02 DIAGNOSIS — R110 Nausea: Secondary | ICD-10-CM

## 2023-08-02 MED ORDER — ONDANSETRON 4 MG PO TBDP
4.0000 mg | ORAL_TABLET | Freq: Three times a day (TID) | ORAL | 0 refills | Status: DC | PRN
Start: 2023-08-02 — End: 2024-07-09

## 2023-08-02 NOTE — Progress Notes (Signed)
Patient ID: Alejandra Conway, female    DOB: Oct 23, 1985, 37 y.o.   MRN: [id number]  Chief Complaint  Patient presents with   Abdominal Pain    Pt I office due to triaged for RL abdomen pain/discomfort for past 2-3 weeks; has called to make an appointment after sending MyChart message and was triaged; sharp apin happens periodically after leap procedure, pain centralized not radiating, this past Friday dull pain pelvic area and radiating to right flank.    Discussed the use of AI scribe software for clinical note transcription with the patient, who gave verbal consent to proceed.  History of Present Illness   The patient presents with a complaint of lower right abdominal pain that has been occurring for approximately two to three weeks. The pain is described as initially sharp, like a needle, but has recently become more dull in nature. The pain is not related to palpation and is not associated with any specific activities or food intake. The pain episodes are random, occurring daily, and last less than a minute. The patient denies any associated symptoms such as fever, changes in bowel habits, or urinary symptoms. The patient also reports occasional nausea with the abd pain over the past few weeks, but denies any specific triggers or association with food intake. The patient denies any changes in bowel habits and reports regular, soft bowel movements.  The patient has a history of a LEEP procedure performed by GYN in mid October, followed by a GYN visit 2 weeks later. The patient denies any relation of the current pain to the procedure. The patient also reports a history of palpitations, which are not associated with anxiety and occur randomly.  The patient also mentions a recent episode of severe pain across the pelvic area that radiated into the back, which occurred on a Friday night. The patient describes the pain as similar to previous experiences with kidney problems. The patient denies any  urinary symptoms such as burning or foul odor.     Assessment & Plan:     Lower Right Abdominal Pain-  Daily, intermittent, needle-like to dull pain in the lower right abdomen for 2-3 weeks. No associated fever, bowel changes, or urinary symptoms and no pain with movement. Recent LEEP procedure but pain is not related. No tenderness on palpation, no masses. -Advise patient to monitor symptoms and avoid greasy, fried, and acidic foods. -Consider over-the-counter generic Pepcid daily for a week. -If pain persists or intensifies, consider ultrasound or CT scan. -Re-evaluate if symptoms persist or worsen.  Nausea - Intermittent nausea for several weeks. No associated vomiting or significant dietary changes. -Prescribe Zofran 4mg  q8h prn medication. -Advise patient to take Pepcid daily for a week. -Re-evaluate if symptoms persist or worsen.     Subjective:    Outpatient Medications Prior to Visit  Medication Sig Dispense Refill   albuterol (VENTOLIN HFA) 108 (90 Base) MCG/ACT inhaler Inhale 2 puffs into the lungs every 6 (six) hours as needed for wheezing or shortness of breath. 18 g 3   aspirin-acetaminophen-caffeine (EXCEDRIN MIGRAINE) 250-250-65 MG tablet Take by mouth every 6 (six) hours as needed for headache.     No facility-administered medications prior to visit.   Past Medical History:  Diagnosis Date   Anxiety    Asthma    followed by pcp   CIN II (cervical intraepithelial neoplasia II)    Depression    H/O LEEP    H/O: vasectomy    partner   History of abnormal  cervical Pap smear    Intermittent palpitations    06-05-2023  per pt from time to time feels palpitations , had monitor yrs ago, normal   Migraine without aura and without status migrainosus, not intractable    neurologist--- dr Epimenio Foot   Past Surgical History:  Procedure Laterality Date   CERVICAL BIOPSY  W/ LOOP ELECTRODE EXCISION  06/29/2023   LEEP N/A 06/29/2023   Procedure: LOOP ELECTROSURGICAL EXCISION  PROCEDURE (LEEP) with ENDOCERVICAL SAMPLE;  Surgeon: Earley Favor, MD;  Location: [facility];  Service: Gynecology;  Laterality: N/A;   NO PAST SURGERIES     WISDOM TOOTH EXTRACTION  2014   Allergies  Allergen Reactions   Pseudoephedrine Shortness Of Breath      Objective:    Physical Exam Vitals and nursing note reviewed.  Constitutional:      Appearance: Normal appearance.  Cardiovascular:     Rate and Rhythm: Normal rate and regular rhythm.  Pulmonary:     Effort: Pulmonary effort is normal.     Breath sounds: Normal breath sounds.  Abdominal:     General: Abdomen is flat. There is no distension.     Palpations: There is no hepatomegaly, splenomegaly or mass.     Tenderness: There is no abdominal tenderness.     Hernia: No hernia is present.  Musculoskeletal:        General: Normal range of motion.  Skin:    General: Skin is warm and dry.  Neurological:     Mental Status: She is alert.  Psychiatric:        Mood and Affect: Mood normal.        Behavior: Behavior normal.    BP 120/74 (BP Location: Left Arm)   Pulse 74   Temp 98.2 F (36.8 C) (Temporal)   Ht 5\' 2"  (1.575 m)   Wt 147 lb 6.4 oz (66.9 kg)   LMP 07/09/2023 (Approximate) Comment: LEEP procedure 06/29/23  SpO2 99%   BMI 26.96 kg/m  Wt Readings from Last 3 Encounters:  08/02/23 147 lb 6.4 oz (66.9 kg)  06/29/23 145 lb 9.6 oz (66 kg)  05/08/23 146 lb (66.2 kg)      Dulce Sellar, NP

## 2023-08-27 ENCOUNTER — Telehealth: Payer: Self-pay | Admitting: *Deleted

## 2023-08-27 NOTE — Telephone Encounter (Signed)
Routing to [employer]. Encounter closed.   Earley Favor, MD  Leda Min, RN May is fine Thank you Dr. Karma Greaser       Previous Messages    ----- Message ----- From: Leda Min, RN Sent: 08/24/2023   4:48 PM EST To: Earley Favor, MD; Eliezer Bottom, CMA Subject: Annell Greening: pap recall                                Dr. Karma Greaser -please review and advise if OK to complete 6 month pap at [facility] due 5/25 or does patient need to return for OV 4/25 for PAP only prior to [facility].   [facility] ----- Message ----- From: Eliezer Bottom, CMA Sent: 08/23/2023   1:10 PM EST To: Leda Min, RN Subject: pap recall                                    Pt had leep done 10/24 & recommendations were for her to have another pap in . She is due for her aex 5/25. Office visit is scheduled for pap for 4/25. Should pt have that rescheduled for 5/25 & just do aex with pap?

## 2023-09-06 ENCOUNTER — Other Ambulatory Visit (HOSPITAL_COMMUNITY): Payer: Self-pay

## 2023-10-29 DIAGNOSIS — R051 Acute cough: Secondary | ICD-10-CM | POA: Diagnosis not present

## 2023-10-29 DIAGNOSIS — R070 Pain in throat: Secondary | ICD-10-CM | POA: Diagnosis not present

## 2023-10-29 DIAGNOSIS — J3489 Other specified disorders of nose and nasal sinuses: Secondary | ICD-10-CM | POA: Diagnosis not present

## 2023-10-29 DIAGNOSIS — R0982 Postnasal drip: Secondary | ICD-10-CM | POA: Diagnosis not present

## 2023-10-29 DIAGNOSIS — J028 Acute pharyngitis due to other specified organisms: Secondary | ICD-10-CM | POA: Diagnosis not present

## 2023-10-29 DIAGNOSIS — R0981 Nasal congestion: Secondary | ICD-10-CM | POA: Diagnosis not present

## 2023-10-29 DIAGNOSIS — J018 Other acute sinusitis: Secondary | ICD-10-CM | POA: Diagnosis not present

## 2023-11-15 ENCOUNTER — Encounter: Payer: Self-pay | Admitting: Neurology

## 2023-11-15 ENCOUNTER — Ambulatory Visit: Payer: Medicaid Other | Admitting: Neurology

## 2024-01-07 ENCOUNTER — Ambulatory Visit: Payer: Medicaid Other | Admitting: Obstetrics and Gynecology

## 2024-01-30 ENCOUNTER — Ambulatory Visit: Admitting: Obstetrics and Gynecology

## 2024-02-28 ENCOUNTER — Encounter: Payer: Self-pay | Admitting: Obstetrics and Gynecology

## 2024-02-28 ENCOUNTER — Ambulatory Visit (INDEPENDENT_AMBULATORY_CARE_PROVIDER_SITE_OTHER): Admitting: Obstetrics and Gynecology

## 2024-02-28 ENCOUNTER — Other Ambulatory Visit (HOSPITAL_COMMUNITY)
Admission: RE | Admit: 2024-02-28 | Discharge: 2024-02-28 | Disposition: A | Discharge status: Home / Self Care | Source: Ambulatory Visit | Attending: Obstetrics and Gynecology | Admitting: Obstetrics and Gynecology

## 2024-02-28 VITALS — BP 122/76 | HR 79 | Ht 62.0 in | Wt 150.0 lb

## 2024-02-28 DIAGNOSIS — Z8041 Family history of malignant neoplasm of ovary: Secondary | ICD-10-CM | POA: Diagnosis not present

## 2024-02-28 DIAGNOSIS — Z1151 Encounter for screening for human papillomavirus (HPV): Secondary | ICD-10-CM | POA: Diagnosis not present

## 2024-02-28 DIAGNOSIS — Z09 Encounter for follow-up examination after completed treatment for conditions other than malignant neoplasm: Secondary | ICD-10-CM | POA: Diagnosis not present

## 2024-02-28 DIAGNOSIS — Z124 Encounter for screening for malignant neoplasm of cervix: Secondary | ICD-10-CM | POA: Insufficient documentation

## 2024-02-28 DIAGNOSIS — Z01419 Encounter for gynecological examination (general) (routine) without abnormal findings: Secondary | ICD-10-CM | POA: Diagnosis not present

## 2024-02-28 DIAGNOSIS — Z87410 Personal history of cervical dysplasia: Secondary | ICD-10-CM | POA: Diagnosis not present

## 2024-02-28 DIAGNOSIS — E785 Hyperlipidemia, unspecified: Secondary | ICD-10-CM | POA: Diagnosis not present

## 2024-02-28 NOTE — Progress Notes (Signed)
 Patient is established patient for annual exam H/o CINII s/p leep for first pap smear after  She is not on any ocp's Fiance with vasectomy Mother with ovarian cancer. To get PUS at next visit Gardesil: declines  Blood pressure 104/70, pulse 72, weight 184 lb (83.5 kg), last menstrual period 06/13/2023, SpO2 100%.    PATHOLOGY SURGICAL PATHOLOGY CASE: [id number] PATIENT: Alejandra Conway Surgical Pathology Report     Clinical History: CIN 2 (las)     FINAL MICROSCOPIC DIAGNOSIS:  A. ENDOCERVICAL, CURETTAGE: - Benign cervical glandular mucosa - Negative for dysplasia or malignancy  B. CERVIX, AT 12 O'CLOCK: - Focal koilocytic atypia, consistent with low-grade squamous intraepithelial lesion (CIN1, low-grade dysplasia)  C. CERVIX, AT 6 O'CLOCK: - Low-grade squamous intraepithelial lesion (CIN1, low grade dysplasia)  D. ENDOCERVICAL, CURETTAGE: - Benign cervical glandular mucosa - Negative for dysplasia or malignancy       GROSS DESCRIPTION:  A: The specimen is received in formalin and consists of a 0.4 x 0.2 x 0.1 cm tan mucus.  The specimen is entirely submitted in 1 cassette.  B: Specimen is received in formalin and consists of a 1.8 x 0.8 x 0.4 cm piece of tan-pink soft tissue.  1 surface is smooth and glistening, and = and surfaces tan and roughened.  Possible ectocervical resection margins inked black, and endocervical resection margin is inked yellow. The specimen is serially sectioned and entirely submitted in 2 cassettes.  C: Specimen is received in formalin and consists of a 2.0 x 0.8 x 0.4 cm piece of tan-pink soft tissue.  1 surface is smooth and glistening, the opposing surfaces tan and roughened.  The possible ectocervical resection margins inked black, and the endocervical resection margin is inked yellow.  Specimen serially section and entirely submitted in 2 cassettes.  D: Specimen is received in formalin, and consists of a 0.9 x 0.3  x 0.2 cm aggregate of red-brown clotted blood.  Specimen is entirely submitted in 1 cassette.  Jeffrey Mini 06/29/2023)   Final Diagnosis performed by Lee Public, MD.   Electronically signed 07/02/2023 Technical and / or Professional components performed at [facility], [address] [place], [place] [address].  Immunohistochemistry Technical component (if applicable) was performed at [facility]. [address], [place], [place] [address].   IMMUNOHISTOCHEMISTRY DISCLAIMER (if applicable): Some of these immunohistochemical stains may have been developed and the performance characteristics determine by [facility]. Some may not have been cleared or approved by the U.S. Food and Drug Administration. The FDA has determined that such clearance or approval is not necessary. This test is used for clinical purposes. It should not be regarded as investigational or for research. This laboratory is certified under the Clinical Laboratory Improvement Amendments of 1988 (CLIA-88) as qualified to perform high complexity clinical laboratory testing.  The controls stained appropriately.   IHC stains are performed on formalin fixed, paraffin embedded tissue using a 3,3diaminobenzidine (DAB) chromogen and Leica Bond Autostainer System. The staining intensity of the nucleus is score manually and is reported as the percentage of tumor cell nuclei demonstrating specific nuclear staining. The specimens are fixed in 10% Neutral Formalin for at least 6 hours and up to 72hrs. These tests are validated on decalcified tissue. Results should be interpreted with caution given the possibility of false negative results on decalcified specimens. Antibody Clones are as follows ER-clone 17F, PR-clone 16, Ki67- clone MM1. Some of these immunohistochemical stains may have been developed and the performance  characteristics determined by [facility].   Resulting Agency [facility]                Past Medical History:  Diagnosis Date   Anxiety    Asthma    followed by pcp   CIN II (cervical intraepithelial neoplasia II)    Depression    H/O LEEP    H/O: vasectomy    partner   History of abnormal cervical Pap smear    Intermittent palpitations    06-05-2023  per pt from time to time feels palpitations , had monitor yrs ago, normal   Migraine without aura and without status migrainosus, not intractable    neurologist--- dr Godwin Lat   Past Surgical History:  Procedure Laterality Date   CERVICAL BIOPSY  W/ LOOP ELECTRODE EXCISION  06/29/2023   LEEP N/A 06/29/2023   Procedure: LOOP ELECTROSURGICAL EXCISION PROCEDURE (LEEP) with ENDOCERVICAL SAMPLE;  Surgeon: Reinaldo Caras, MD;  Location: [facility];  Service: Gynecology;  Laterality: N/A;   NO PAST SURGERIES     WISDOM TOOTH EXTRACTION  2014   OB History     Gravida  4   Para  3   Term  3   Preterm  0   AB  1   Living  3      SAB  0   IAB  1   Ectopic  0   Multiple  0   Live Births  3          Social History   Socioeconomic History   Marital status: Divorced    Spouse name: Not on file   Number of children: 3   Years of education: Not on file   Highest education level: Associate degree: occupational, Scientist, product/process development, or vocational program  Occupational History   Not on file  Tobacco Use   Smoking status: Never    Passive exposure: Never   Smokeless tobacco: Never  Vaping Use   Vaping status: Never Used  Substance and Sexual Activity   Alcohol use: Not Currently    Comment: rare   Drug use: Never   Sexual activity: Yes    Partners: Male    Birth control/protection: Other-see comments    Comment: vasectomy-fiance, menarche 38yo, sexual debut 38yo  Other Topics Concern   Not on file  Social History Narrative   Right handed   Drink coffee    Occ soda usage    Social Drivers of Corporate investment banker Strain:  Not on file  Food Insecurity: Not on file  Transportation Needs: Not on file  Physical Activity: Not on file  Stress: Not on file  Social Connections: Not on file   Current Outpatient Medications on File Prior to Visit  Medication Sig Dispense Refill   albuterol  (VENTOLIN  HFA) 108 (90 Base) MCG/ACT inhaler Inhale 2 puffs into the lungs every 6 (six) hours as needed for wheezing or shortness of breath. 18 g 3   aspirin-acetaminophen -caffeine (EXCEDRIN MIGRAINE) 250-250-65 MG tablet Take by mouth every 6 (six) hours as needed for headache.     ondansetron  (ZOFRAN -ODT) 4 MG disintegrating tablet Take 1 tablet (4 mg total) by mouth every 8 (eight) hours as needed for nausea or vomiting. (Patient not taking: Reported on 02/28/2024) 20 tablet 0   No current facility-administered medications on file prior to visit.   Allergies  Allergen Reactions   Pseudoephedrine Shortness Of Breath    A/p GYN annual exam, h/o CIN2 s/p LEEP  Counseled on the importance of the gardesil.  She will think about it Pap smear collected today Labs: see orders Counseled on healthy lifestyle practices  Dr. Tia Flowers

## 2024-02-29 ENCOUNTER — Ambulatory Visit: Payer: Self-pay | Admitting: Obstetrics and Gynecology

## 2024-02-29 LAB — CBC
HCT: 41.3 % (ref 35.0–45.0)
Hemoglobin: 13.8 g/dL (ref 11.7–15.5)
MCH: 29.2 pg (ref 27.0–33.0)
MCHC: 33.4 g/dL (ref 32.0–36.0)
MCV: 87.5 fL (ref 80.0–100.0)
MPV: 11.4 fL (ref 7.5–12.5)
Platelets: 223 10*3/uL (ref 140–400)
RBC: 4.72 10*6/uL (ref 3.80–5.10)
RDW: 12.8 % (ref 11.0–15.0)
WBC: 6.7 10*3/uL (ref 3.8–10.8)

## 2024-02-29 LAB — COMPREHENSIVE METABOLIC PANEL WITH GFR
AG Ratio: 1.5 (calc) (ref 1.0–2.5)
ALT: 12 U/L (ref 6–29)
AST: 12 U/L (ref 10–30)
Albumin: 4.4 g/dL (ref 3.6–5.1)
Alkaline phosphatase (APISO): 55 U/L (ref 31–125)
BUN: 16 mg/dL (ref 7–25)
CO2: 24 mmol/L (ref 20–32)
Calcium: 9.4 mg/dL (ref 8.6–10.2)
Chloride: 104 mmol/L (ref 98–110)
Creat: 0.68 mg/dL (ref 0.50–0.97)
Globulin: 2.9 g/dL (ref 1.9–3.7)
Glucose, Bld: 86 mg/dL (ref 65–99)
Potassium: 4.1 mmol/L (ref 3.5–5.3)
Sodium: 138 mmol/L (ref 135–146)
Total Bilirubin: 0.4 mg/dL (ref 0.2–1.2)
Total Protein: 7.3 g/dL (ref 6.1–8.1)
eGFR: 116 mL/min/{1.73_m2} (ref >=60)

## 2024-02-29 LAB — CA 125: CA 125: 17 U/mL (ref <35)

## 2024-02-29 LAB — LIPID PANEL
Cholesterol: 175 mg/dL (ref <200)
HDL: 43 mg/dL — ABNORMAL LOW (ref >=50)
LDL Cholesterol (Calc): 90 mg/dL
Non-HDL Cholesterol (Calc): 132 mg/dL — ABNORMAL HIGH (ref <130)
Total CHOL/HDL Ratio: 4.1 (calc) (ref <5.0)
Triglycerides: 313 mg/dL — ABNORMAL HIGH (ref <150)

## 2024-02-29 LAB — HEPATITIS B SURFACE ANTIGEN: Hepatitis B Surface Ag: NONREACTIVE

## 2024-02-29 LAB — SURESWAB® ADVANCED VAGINITIS PLUS,TMA
C. trachomatis RNA, TMA: NOT DETECTED
CANDIDA SPECIES: NOT DETECTED
Candida glabrata: NOT DETECTED
N. gonorrhoeae RNA, TMA: NOT DETECTED
SURESWAB(R) ADV BACTERIAL VAGINOSIS(BV),TMA: POSITIVE — AB
TRICHOMONAS VAGINALIS (TV),TMA: NOT DETECTED

## 2024-02-29 LAB — HEMOGLOBIN A1C
Hgb A1c MFr Bld: 5.1 % (ref <5.7)
Mean Plasma Glucose: 100 mg/dL
eAG (mmol/L): 5.5 mmol/L

## 2024-02-29 LAB — HEPATITIS C ANTIBODY: Hepatitis C Ab: NONREACTIVE

## 2024-02-29 LAB — HIV ANTIBODY (ROUTINE TESTING W REFLEX): HIV 1&2 Ab, 4th Generation: NONREACTIVE

## 2024-02-29 LAB — VITAMIN D 25 HYDROXY (VIT D DEFICIENCY, FRACTURES): Vit D, 25-Hydroxy: 42 ng/mL (ref 30–100)

## 2024-02-29 LAB — RPR: RPR Ser Ql: NONREACTIVE

## 2024-02-29 LAB — TSH: TSH: 1.01 m[IU]/L

## 2024-03-03 MED ORDER — METRONIDAZOLE 500 MG PO TABS
500.0000 mg | ORAL_TABLET | Freq: Two times a day (BID) | ORAL | 0 refills | Status: AC
Start: 2024-03-03 — End: 2024-03-10

## 2024-03-05 ENCOUNTER — Encounter: Payer: Self-pay | Admitting: Obstetrics and Gynecology

## 2024-03-05 LAB — CYTOLOGY - PAP
Comment: NEGATIVE
Diagnosis: NEGATIVE
High risk HPV: NEGATIVE

## 2024-06-24 ENCOUNTER — Other Ambulatory Visit: Payer: Self-pay

## 2024-06-24 ENCOUNTER — Emergency Department (HOSPITAL_BASED_OUTPATIENT_CLINIC_OR_DEPARTMENT_OTHER)
Admission: EM | Admit: 2024-06-24 | Discharge: 2024-06-24 | Disposition: A | Discharge status: Home / Self Care | Attending: Emergency Medicine | Admitting: Emergency Medicine

## 2024-06-24 ENCOUNTER — Encounter (HOSPITAL_BASED_OUTPATIENT_CLINIC_OR_DEPARTMENT_OTHER): Payer: Self-pay | Admitting: Emergency Medicine

## 2024-06-24 ENCOUNTER — Emergency Department (HOSPITAL_BASED_OUTPATIENT_CLINIC_OR_DEPARTMENT_OTHER)

## 2024-06-24 DIAGNOSIS — Y9366 Activity, soccer: Secondary | ICD-10-CM | POA: Insufficient documentation

## 2024-06-24 DIAGNOSIS — J45909 Unspecified asthma, uncomplicated: Secondary | ICD-10-CM | POA: Diagnosis not present

## 2024-06-24 DIAGNOSIS — S8012XA Contusion of left lower leg, initial encounter: Secondary | ICD-10-CM | POA: Diagnosis not present

## 2024-06-24 DIAGNOSIS — W500XXA Accidental hit or strike by another person, initial encounter: Secondary | ICD-10-CM | POA: Diagnosis not present

## 2024-06-24 DIAGNOSIS — S8992XA Unspecified injury of left lower leg, initial encounter: Secondary | ICD-10-CM | POA: Diagnosis present

## 2024-06-24 MED ORDER — KETOROLAC TROMETHAMINE 60 MG/2ML IM SOLN
30.0000 mg | Freq: Once | INTRAMUSCULAR | Status: DC
  Filled 2024-06-24: qty 2

## 2024-06-24 NOTE — Discharge Instructions (Addendum)
 Your x-ray imaging was negative for fracture.  Your symptoms are consistent with a hematoma of the left lower extremity.  Recommend rest, take Tylenol  and Motrin  for pain control, elevate the extremity as needed for swelling, can ice the extremity for the next 24 hours.

## 2024-06-24 NOTE — ED Notes (Signed)
 Xray tech at bedside.

## 2024-06-24 NOTE — ED Provider Notes (Signed)
 [place] EMERGENCY DEPARTMENT AT [facility] Provider Note   CSN: [id number] Arrival date & time: 06/24/24  9191     Patient presents with: Leg Injury   Alejandra  Conway is a 38 y.o. female.   HPI   47 female with medical history significant for asthma, migraine headaches, anxiety, depression presenting to the emergency department with a chief complaint of lower leg pain.  The patient was playing soccer and collided shin to shin with her son last night.  She sustained a hematoma to the medial aspect of the left leg with associated swelling.  She has been able to bear weight but endorses persistent pain in her left leg.  She has tried Tylenol  and ibuprofen  last night.  She denies any other injuries or complaints. She endorses some numbness to the anterior shin, denies any paresthesias in her foot.  Prior to Admission medications   Medication Sig Start Date End Date Taking? Authorizing Provider  albuterol  (VENTOLIN  HFA) 108 (90 Base) MCG/ACT inhaler Inhale 2 puffs into the lungs every 6 (six) hours as needed for wheezing or shortness of breath. 01/03/23   Lucius Krabbe, NP  aspirin-acetaminophen -caffeine (EXCEDRIN MIGRAINE) 250-250-65 MG tablet Take by mouth every 6 (six) hours as needed for headache.    [provider]  ondansetron  (ZOFRAN -ODT) 4 MG disintegrating tablet Take 1 tablet (4 mg total) by mouth every 8 (eight) hours as needed for nausea or vomiting. Patient not taking: Reported on 02/28/2024 08/02/23   Lucius Krabbe, NP    Allergies: Pseudoephedrine    Review of Systems  All other systems reviewed and are negative.   Updated Vital Signs BP (!) 156/91   Pulse 85   Temp 97.7 F (36.5 C) (Oral)   Resp 18   Wt 65.8 kg   LMP 06/15/2024   SpO2 97%   BMI 26.52 kg/m   Physical Exam Vitals and nursing note reviewed.  Constitutional:      General: She is not in acute distress. HENT:     Head: Normocephalic and atraumatic.  Eyes:      Conjunctiva/sclera: Conjunctivae normal.     Pupils: Pupils are equal, round, and reactive to light.  Cardiovascular:     Rate and Rhythm: Normal rate and regular rhythm.  Pulmonary:     Effort: Pulmonary effort is normal. No respiratory distress.  Abdominal:     General: There is no distension.     Tenderness: There is no guarding.  Musculoskeletal:        General: Swelling, tenderness and signs of injury present. No deformity.     Cervical back: Neck supple.     Comments: Hematoma to the medial aspect of the left lower extremity, no obvious deformity, compartments are soft, distal pulses intact  Skin:    Findings: No lesion or rash.  Neurological:     General: No focal deficit present.     Mental Status: She is alert. Mental status is at baseline.     (all labs ordered are listed, but only abnormal results are displayed) Labs Reviewed - No data to display  EKG: None  Radiology: DG Tibia/Fibula Left Result Date: 06/24/2024 CLINICAL DATA:  Left lower extremity pain after shin injury last night. Swelling. EXAM: LEFT TIBIA AND FIBULA - 2 VIEW COMPARISON:  None Available. FINDINGS: There is no evidence of fracture or other focal bone lesions. Soft tissues are unremarkable. IMPRESSION: Negative. Electronically Signed   By: Camellia Candle M.D.   On: 06/24/2024 08:37  Procedures   Medications Ordered in the ED  ketorolac  (TORADOL ) injection 30 mg (has no administration in time range)                                    Medical Decision Making Amount and/or Complexity of Data Reviewed Radiology: ordered.  Risk Prescription drug management.    61 female with medical history significant for asthma, migraine headaches, anxiety, depression presenting to the emergency department with a chief complaint of lower leg pain.  The patient was playing soccer and collided shin to shin with her son last night.  She sustained a hematoma to the medial aspect of the left leg with  associated swelling.  She has been able to bear weight but endorses persistent pain in her left leg.  She has tried Tylenol  and ibuprofen  last night.  She denies any other injuries or complaints. She endorses some numbness to the anterior shin, denies any paresthesias in her foot.  On arrival, the patient was vitally stable, compartments were notably soft in the lower extremity, presenting with a sizable hematoma to the medial aspect of the left lower extremity, distal pulses intact.  Low concern for compartment syndrome, compartment soft on exam, distal pulses intact.  X-ray L tib-fib: Negative for fracture or dislocation  Patient provided with crutches to assist with ambulation as needed.  She was advised rest, ice, elevation of the extremity, NSAIDs for pain control.  Patient overall stable for discharge.     Final diagnoses:  Hematoma of left lower leg    ED Discharge Orders     None          Jerrol Agent, MD 06/24/24 [phone]

## 2024-06-24 NOTE — ED Triage Notes (Signed)
 Pt c/o LLE pain after shin to shin injury last night. Endorses swelling

## 2024-06-24 NOTE — ED Notes (Signed)
 Declined pain meds at this time. Pt will notify RN if she'd like toradol  prior to DC

## 2024-06-24 NOTE — ED Notes (Signed)
 Pt refused crutches. States she has the same set at home and wouldn't like a new set

## 2024-07-08 ENCOUNTER — Encounter: Payer: Self-pay | Admitting: Family

## 2024-07-09 ENCOUNTER — Encounter: Payer: Self-pay | Admitting: Family

## 2024-07-09 ENCOUNTER — Ambulatory Visit: Admitting: Family

## 2024-07-09 VITALS — BP 150/80 | HR 65 | Temp 98.1°F | Ht 62.0 in | Wt 150.5 lb

## 2024-07-09 DIAGNOSIS — M79662 Pain in left lower leg: Secondary | ICD-10-CM

## 2024-07-09 DIAGNOSIS — M7989 Other specified soft tissue disorders: Secondary | ICD-10-CM | POA: Diagnosis not present

## 2024-07-09 MED ORDER — NAPROXEN 500 MG PO TABS
500.0000 mg | ORAL_TABLET | Freq: Two times a day (BID) | ORAL | 0 refills | Status: AC
Start: 2024-07-09 — End: ?

## 2024-07-09 NOTE — Progress Notes (Signed)
 Patient ID: Alejandra  Conway, female    DOB: 10/13/1985, 38 y.o.   MRN: [id number]  Chief Complaint  Patient presents with   Leg Pain    Pt c/o left leg and ankle pain, swelling and numbness, present for 2 weeks.   Discussed the use of AI scribe software for clinical note transcription with the patient, who gave verbal consent to proceed.  History of Present Illness Alejandra Conway  Care is a 38 year old female who presents with persistent ankle pain and swelling following a sports injury.  Two Mondays ago, during a parent-kid soccer scrimmage, her son accidentally hit her leg with his knee, causing immediate swelling and bruising. The bruising has improved, but numbness and tightness persist. Swelling extended to her ankle, causing soreness and difficulty with movement, particularly on the medial side. She experiences significant pain, limping, and a sensation of instability in her ankle.  She manages the swelling with elevation and a bandage, which provides some relief. She uses over-the-counter pain relief intermittently, including Tylenol  and ibuprofen . Her fianc purchased a compression sock to help with the swelling.  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 The pain does not significantly disturb her sleep, although she uses a pillow for support. She experiences stiffness and pain upon waking and when getting up during the night.  Assessment & Plan Left lower leg hematoma  Sustained hematoma with swelling and pain after impact. Swelling extends to ankle & foot, causing soreness and limited mobility. Numbness and difficulty with ankle movement due to fluid accumulation. No deep vein thrombosis. - Prescribed naproxen twice daily for five days, then prn. - Advised heat application on hematoma for 20 minutes twice daily. - Recommended mild compression sock during the day, ensuring it is not too tight up to the knee. - Encouraged  leg elevation to reduce swelling whenever sitting. - Advised hydration with at least two liters of fluids daily. - Instructed to avoid wearing compression sock overnight. - Advised to monitor for improvement and report if no improvement or worsening symptoms.  Subjective:    Outpatient Medications Prior to Visit  Medication Sig Dispense Refill   albuterol  (VENTOLIN  HFA) 108 (90 Base) MCG/ACT inhaler Inhale 2 puffs into the lungs every 6 (six) hours as needed for wheezing or shortness of breath. 18 g 3   aspirin-acetaminophen -caffeine (EXCEDRIN MIGRAINE) 250-250-65 MG tablet Take by mouth every 6 (six) hours as needed for headache.     ondansetron  (ZOFRAN -ODT) 4 MG disintegrating tablet Take 1 tablet (4 mg total) by mouth every 8 (eight) hours as needed for nausea or vomiting. (Patient not taking: Reported on 02/28/2024) 20 tablet 0   No facility-administered medications prior to visit.   Past Medical History:  Diagnosis Date   Anxiety    Asthma    followed by pcp   CIN II (cervical intraepithelial neoplasia II)    Depression    H/O LEEP    H/O: vasectomy    partner   History of abnormal cervical Pap smear    Intermittent palpitations    06-05-2023  per pt from time to time feels palpitations , had monitor yrs ago, normal   Migraine without aura and without status migrainosus, not intractable    neurologist--- dr vear   Past Surgical History:  Procedure Laterality Date   CERVICAL BIOPSY  W/ LOOP ELECTRODE EXCISION  06/29/2023   LEEP N/A 06/29/2023   Procedure: LOOP ELECTROSURGICAL EXCISION PROCEDURE (LEEP) with  ENDOCERVICAL SAMPLE;  Surgeon: Glennon Almarie POUR, MD;  Location: [facility];  Service: Gynecology;  Laterality: N/A;   NO PAST SURGERIES     WISDOM TOOTH EXTRACTION  2014   Allergies  Allergen Reactions   Pseudoephedrine Shortness Of Breath      Objective:    Physical Exam Vitals and nursing note reviewed.  Constitutional:      Appearance:  Normal appearance.  Cardiovascular:     Rate and Rhythm: Normal rate and regular rhythm.  Pulmonary:     Effort: Pulmonary effort is normal.     Breath sounds: Normal breath sounds.  Musculoskeletal:     Left lower leg: Swelling (approx. 4cm diameter slightly raised hematoma with mild resolving bruising), tenderness and bony tenderness (around hematoma) present. 2+ Edema present.     Left ankle: Swelling (1-2+) and ecchymosis (mild) present. Tenderness present over the lateral malleolus and medial malleolus. Decreased range of motion.  Skin:    General: Skin is warm and dry.  Neurological:     Mental Status: She is alert.  Psychiatric:        Mood and Affect: Mood normal.        Behavior: Behavior normal.    BP (!) 160/92 (BP Location: Left Arm, Patient Position: Sitting, Cuff Size: Normal)   Pulse 65   Temp 98.1 F (36.7 C) (Temporal)   Ht 5' 2 (1.575 m)   Wt 150 lb 8 oz (68.3 kg)   LMP 06/15/2024 (Exact Date)   SpO2 98%   BMI 27.53 kg/m  Wt Readings from Last 3 Encounters:  07/09/24 150 lb 8 oz (68.3 kg)  06/24/24 145 lb (65.8 kg)  02/28/24 150 lb (68 kg)      Lucius Krabbe, NP
